# Patient Record
Sex: Male | Born: 1945 | Race: Black or African American | Hispanic: No | Marital: Married | Smoking: Former smoker
Health system: Southern US, Community
[De-identification: ages and names within clinical notes are randomized; demographics above are authoritative.]

## PROBLEM LIST (undated history)

## (undated) DIAGNOSIS — E7800 Pure hypercholesterolemia, unspecified: Secondary | ICD-10-CM

## (undated) DIAGNOSIS — I10 Essential (primary) hypertension: Secondary | ICD-10-CM

## (undated) DIAGNOSIS — E291 Testicular hypofunction: Secondary | ICD-10-CM

## (undated) DIAGNOSIS — K279 Peptic ulcer, site unspecified, unspecified as acute or chronic, without hemorrhage or perforation: Secondary | ICD-10-CM

## (undated) DIAGNOSIS — R7303 Prediabetes: Secondary | ICD-10-CM

## (undated) DIAGNOSIS — N529 Male erectile dysfunction, unspecified: Secondary | ICD-10-CM

## (undated) DIAGNOSIS — C801 Malignant (primary) neoplasm, unspecified: Secondary | ICD-10-CM

## (undated) DIAGNOSIS — K219 Gastro-esophageal reflux disease without esophagitis: Secondary | ICD-10-CM

## (undated) HISTORY — DX: Testicular hypofunction: E29.1

## (undated) HISTORY — DX: Gastro-esophageal reflux disease without esophagitis: K21.9

## (undated) HISTORY — PX: TONSILLECTOMY: SUR1361

## (undated) HISTORY — DX: Prediabetes: R73.03

## (undated) HISTORY — DX: Peptic ulcer, site unspecified, unspecified as acute or chronic, without hemorrhage or perforation: K27.9

## (undated) HISTORY — PX: FINGER AMPUTATION: SHX636

## (undated) HISTORY — DX: Male erectile dysfunction, unspecified: N52.9

---

## 1998-10-12 ENCOUNTER — Emergency Department (HOSPITAL_COMMUNITY): Admission: EM | Admit: 1998-10-12 | Discharge: 1998-10-12 | Payer: Self-pay | Admitting: Emergency Medicine

## 1999-10-02 ENCOUNTER — Other Ambulatory Visit: Admission: RE | Admit: 1999-10-02 | Discharge: 1999-10-02 | Payer: Self-pay | Admitting: Family Medicine

## 2000-08-18 ENCOUNTER — Encounter: Payer: Self-pay | Admitting: Emergency Medicine

## 2000-08-18 ENCOUNTER — Emergency Department (HOSPITAL_COMMUNITY): Admission: EM | Admit: 2000-08-18 | Discharge: 2000-08-18 | Payer: Self-pay | Admitting: Emergency Medicine

## 2002-03-03 ENCOUNTER — Encounter: Admission: RE | Admit: 2002-03-03 | Discharge: 2002-03-03 | Payer: Self-pay | Admitting: Urology

## 2002-03-03 ENCOUNTER — Encounter: Payer: Self-pay | Admitting: Urology

## 2007-01-04 ENCOUNTER — Encounter: Admission: RE | Admit: 2007-01-04 | Discharge: 2007-01-04 | Payer: Self-pay | Admitting: Family Medicine

## 2007-02-01 ENCOUNTER — Encounter: Admission: RE | Admit: 2007-02-01 | Discharge: 2007-02-22 | Payer: Self-pay | Admitting: Family Medicine

## 2008-11-23 ENCOUNTER — Inpatient Hospital Stay (HOSPITAL_BASED_OUTPATIENT_CLINIC_OR_DEPARTMENT_OTHER): Admission: RE | Admit: 2008-11-23 | Discharge: 2008-11-23 | Payer: Self-pay | Admitting: Cardiology

## 2010-10-03 ENCOUNTER — Ambulatory Visit
Admission: RE | Admit: 2010-10-03 | Discharge: 2010-10-03 | Disposition: A | Discharge status: Home / Self Care | Payer: Self-pay | Source: Ambulatory Visit | Attending: Family Medicine | Admitting: Family Medicine

## 2010-10-03 ENCOUNTER — Other Ambulatory Visit: Payer: Self-pay | Admitting: Family Medicine

## 2010-10-03 DIAGNOSIS — R05 Cough: Secondary | ICD-10-CM

## 2010-11-26 NOTE — Cardiovascular Report (Signed)
Drew [name], Drew [name]                ACCOUNT NO.:  [id number]   MEDICAL RECORD NO.:  [id number]          PATIENT TYPE:  OIB   LOCATION:  1961                         FACILITY:  [facility]   PHYSICIAN:  Jake Bathe, MD      DATE OF BIRTH:  01-02-1946   DATE OF PROCEDURE:  11/23/2008  DATE OF DISCHARGE:                            CARDIAC CATHETERIZATION   PROCEDURES:  1. Left heart catheterization  2. Selective coronary angiography.  3. Left ventriculogram.  4. Aortogram.   INDICATIONS:  A 65 year old African American male with hyperlipidemia,  hypertension, recent chest discomfort quite atypical when raising his  hands above his head after heavy work who also had prior dyspnea on  exertion who underwent nuclear stress test on October 30, 2008,  demonstrating a mild-to-moderate ischemic pattern in the anterior wall  distribution as well as possible mid inferior ischemia.  His ejection  fraction was normal at 71%.  He also experienced downsloping 1.5-mm ST  depressions at peak stress and T-wave inversion in III and aVF.   PROCEDURE DETAILS:  Informed consent was obtained.  Risk of stroke,  cardiac attack, death, renal impairment were relayed to the patient at  length.  Arterial damage and venous damage were also explained as  possibility.  He was placed on a catheterization table, prepped in a  sterile fashion.  Lidocaine 1% was used for local anesthesia after  visualizing the femoral head with fluoroscopy.  A No Torque Williams  right catheter was used after unsuccessful attempts with getting the  wire around the arch was used to steer the long exchange wire around the  arch.  This catheter was used to engage the right coronary artery, which  was nondominant, one single shot was obtained.  This catheter was then  exchanged for a Judkins left #4 catheter which was used to selectively  cannulate the left main artery.  Multiple views with hand injection of  Omnipaque were obtained.  This  catheter was then exchanged for an angled  pigtail, which was used to perform left ventriculogram in the RAO  position utilizing power injection.  Hemodynamics were obtained within  the left ventricle and in the aorta, then an aortogram was performed of  the aortic arch in the LAO position to better define his great vessel  anatomy.  30 mL of dye was used for the aortic arch, 25 mL of dye was  used for the left ventriculogram.   FINDINGS:  1. Left main artery - widely patent split into the circumflex, small      ramus branch, and left anterior descending artery.  2. Left anterior descending artery - this vessel has 2 small caliber      diagonal vessels and continues to wrap around the apex to the      distal portion of the inferior wall.  There are only minor      irregularities in this vessel, but no angiographically significant      coronary artery disease.  3. Left circumflex artery - this is the dominant vessel giving rise to  the posterior descending artery.  There are 3 obtuse marginal      branches, the first of which has approximately a 50-70% ostial      stenosis.  The vessel after the stenosis is moderate in size and      supplies the lateral wall - note, there was no ischemia noted in      this lateral wall distribution.  4. Right coronary artery - small nondominant vessel.  5. Left ventriculogram, normal ejection fraction with no wall motion      abnormalities.  Ejection fraction 70%.  No significant mitral      regurgitation present.  6. Aortogram.  The patient has the following anatomy.  He has 2 great      vessels arising from his aortic arch.  The first vessel gives rise      to a large trunk, which then branches into the right subclavian      artery as well as the left internal carotid artery.  He has a      single trunk giving rise to his right subclavian artery and his      right carotid artery, which then branches into his internal and      external carotid.   7. Aortogram.  There are 2 main takeoffs from the aorta.  The first of      which a large trunk, which gives rise to the right subclavian      artery and right common carotid artery.  The second takeoff from      the aortic arch gives rise to the left subclavian and the left      internal carotid artery.  The aortic root is also mildly dilated.      He tolerated the procedure well.  No complications.   IMPRESSION:  1. 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  No evidence of ischemia in this distribution on      nuclear stress test.  2. Normal ejection fraction 70% with no significant mitral stenosis or      aortic stenosis present.  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   PLAN:  We will go ahead and place him on simvastatin 20 mg once a day  for his hyperlipidemia, LDL of 164, and also give him metoprolol ER 25  mg once a day given his stress test findings.  He did exercise for  greater than 8 minutes, which is a good prognostic indicator.      Jake Bathe, MD  Electronically Signed     MCS/MEDQ  D:  11/23/2008  T:  11/23/2008  Job:  [id number]   cc:   Bryan Lemma. Manus Gunning, M.D.

## 2011-04-26 ENCOUNTER — Emergency Department (HOSPITAL_COMMUNITY): Payer: PRIVATE HEALTH INSURANCE

## 2011-04-26 ENCOUNTER — Emergency Department (HOSPITAL_COMMUNITY)
Admission: EM | Admit: 2011-04-26 | Discharge: 2011-04-26 | Disposition: A | Discharge status: Home / Self Care | Payer: PRIVATE HEALTH INSURANCE | Attending: Emergency Medicine | Admitting: Emergency Medicine

## 2011-04-26 DIAGNOSIS — R5381 Other malaise: Secondary | ICD-10-CM | POA: Insufficient documentation

## 2011-04-26 DIAGNOSIS — E7800 Pure hypercholesterolemia, unspecified: Secondary | ICD-10-CM | POA: Insufficient documentation

## 2011-04-26 DIAGNOSIS — R112 Nausea with vomiting, unspecified: Secondary | ICD-10-CM | POA: Insufficient documentation

## 2011-04-26 DIAGNOSIS — R55 Syncope and collapse: Secondary | ICD-10-CM | POA: Insufficient documentation

## 2011-04-26 DIAGNOSIS — R0789 Other chest pain: Secondary | ICD-10-CM | POA: Insufficient documentation

## 2011-04-26 DIAGNOSIS — R61 Generalized hyperhidrosis: Secondary | ICD-10-CM | POA: Insufficient documentation

## 2011-04-26 DIAGNOSIS — Z79899 Other long term (current) drug therapy: Secondary | ICD-10-CM | POA: Insufficient documentation

## 2011-04-26 DIAGNOSIS — R059 Cough, unspecified: Secondary | ICD-10-CM | POA: Insufficient documentation

## 2011-04-26 DIAGNOSIS — I10 Essential (primary) hypertension: Secondary | ICD-10-CM | POA: Insufficient documentation

## 2011-04-26 DIAGNOSIS — R42 Dizziness and giddiness: Secondary | ICD-10-CM | POA: Insufficient documentation

## 2011-04-26 DIAGNOSIS — R05 Cough: Secondary | ICD-10-CM | POA: Insufficient documentation

## 2011-04-26 LAB — DIFFERENTIAL
Basophils Absolute: 0 10*3/uL (ref 0.0–0.1)
Basophils Relative: 1 % (ref 0–1)
Eosinophils Absolute: 0.2 10*3/uL (ref 0.0–0.7)
Eosinophils Relative: 4 % (ref 0–5)
Monocytes Absolute: 0.2 10*3/uL (ref 0.1–1.0)
Monocytes Relative: 6 % (ref 3–12)
Neutro Abs: 2.6 10*3/uL (ref 1.7–7.7)

## 2011-04-26 LAB — COMPREHENSIVE METABOLIC PANEL
ALT: 30 U/L (ref 0–53)
AST: 33 U/L (ref 0–37)
Albumin: 3.6 g/dL (ref 3.5–5.2)
Alkaline Phosphatase: 89 U/L (ref 39–117)
CO2: 27 mEq/L (ref 19–32)
Chloride: 103 mEq/L (ref 96–112)
GFR calc non Af Amer: 85 mL/min — ABNORMAL LOW (ref >90)
Potassium: 3.9 mEq/L (ref 3.5–5.1)
Total Bilirubin: 0.4 mg/dL (ref 0.3–1.2)

## 2011-04-26 LAB — CBC
Hemoglobin: 13.1 g/dL (ref 13.0–17.0)
MCH: 29.4 pg (ref 26.0–34.0)
MCHC: 33.7 g/dL (ref 30.0–36.0)
RDW: 14 % (ref 11.5–15.5)

## 2011-04-26 LAB — POCT I-STAT TROPONIN I: Troponin i, poc: 0 ng/mL (ref 0.00–0.08)

## 2011-09-25 ENCOUNTER — Emergency Department (HOSPITAL_COMMUNITY)
Admission: EM | Admit: 2011-09-25 | Discharge: 2011-09-25 | Disposition: A | Discharge status: Home / Self Care | Payer: PRIVATE HEALTH INSURANCE | Attending: Emergency Medicine | Admitting: Emergency Medicine

## 2011-09-25 ENCOUNTER — Other Ambulatory Visit: Payer: Self-pay

## 2011-09-25 ENCOUNTER — Encounter (HOSPITAL_COMMUNITY): Payer: Self-pay | Admitting: *Deleted

## 2011-09-25 ENCOUNTER — Emergency Department (HOSPITAL_COMMUNITY): Payer: PRIVATE HEALTH INSURANCE

## 2011-09-25 DIAGNOSIS — R55 Syncope and collapse: Secondary | ICD-10-CM | POA: Insufficient documentation

## 2011-09-25 DIAGNOSIS — R110 Nausea: Secondary | ICD-10-CM | POA: Insufficient documentation

## 2011-09-25 DIAGNOSIS — R51 Headache: Secondary | ICD-10-CM | POA: Insufficient documentation

## 2011-09-25 DIAGNOSIS — E785 Hyperlipidemia, unspecified: Secondary | ICD-10-CM | POA: Insufficient documentation

## 2011-09-25 DIAGNOSIS — R61 Generalized hyperhidrosis: Secondary | ICD-10-CM | POA: Insufficient documentation

## 2011-09-25 DIAGNOSIS — I10 Essential (primary) hypertension: Secondary | ICD-10-CM | POA: Insufficient documentation

## 2011-09-25 HISTORY — DX: Essential (primary) hypertension: I10

## 2011-09-25 HISTORY — DX: Pure hypercholesterolemia, unspecified: E78.00

## 2011-09-25 LAB — POCT I-STAT, CHEM 8
BUN: 12 mg/dL (ref 6–23)
Hemoglobin: 13.9 g/dL (ref 13.0–17.0)
Potassium: 3.9 mEq/L (ref 3.5–5.1)
Sodium: 141 mEq/L (ref 135–145)
TCO2: 27 mmol/L (ref 0–100)

## 2011-09-25 LAB — CBC
MCV: 85.8 fL (ref 78.0–100.0)
Platelets: 242 10*3/uL (ref 150–400)
RDW: 13.9 % (ref 11.5–15.5)
WBC: 8 10*3/uL (ref 4.0–10.5)

## 2011-09-25 LAB — URINALYSIS, DIPSTICK ONLY
Glucose, UA: NEGATIVE mg/dL
Leukocytes, UA: NEGATIVE
Protein, ur: NEGATIVE mg/dL
Specific Gravity, Urine: 1.009 (ref 1.005–1.030)
Urobilinogen, UA: 0.2 mg/dL (ref 0.0–1.0)

## 2011-09-25 LAB — POCT I-STAT TROPONIN I: Troponin i, poc: 0.01 ng/mL (ref 0.00–0.08)

## 2011-09-25 NOTE — ED Provider Notes (Signed)
History     CSN: [id number]  Arrival date & time 09/25/11  1640   First MD Initiated Contact with Patient 09/25/11 2118      Chief Complaint  Patient presents with  . Dizziness  . Near Syncope    (Consider location/radiation/quality/duration/timing/severity/associated sxs/prior treatment) HPI Cc short episode of lightheadedness associated with mild nausea and sweating onset about 5 hours ago, was sitting in his car when onset.  sxs were mild, lasted a few seconds.   Past Medical History  Diagnosis Date  . Hypertension   . High cholesterol     History reviewed. No pertinent past surgical history.  History reviewed. No pertinent family history.  History  Substance Use Topics  . Smoking status: Never Smoker   . Smokeless tobacco: Not on file  . Alcohol Use: Yes      Review of Systems  Constitutional: Negative for fever and chills.  HENT: Negative for neck pain and neck stiffness.   Respiratory: Negative for cough and shortness of breath.   Cardiovascular: Negative for chest pain and palpitations.  Gastrointestinal: Positive for nausea. Negative for vomiting, abdominal pain and diarrhea.  Genitourinary: Negative for dysuria.  Neurological: Positive for syncope (near syncope) and light-headedness. Negative for dizziness, seizures, speech difficulty, weakness, numbness and headaches.  All other systems reviewed and are negative.    Allergies  Review of patient's allergies indicates no known allergies.  Home Medications   Current Outpatient Rx  Name Route Sig Dispense Refill  . ASPIRIN EC 81 MG PO TBEC Oral Take 81 mg by mouth daily.    Marland Kitchen PRESCRIPTION MEDICATION Oral Take 1 tablet by mouth daily. Cholesterol and HBP medication    . SODIUM CHLORIDE 0.65 % NA SOLN Nasal Place 1 spray into the nose daily as needed. For congestion      BP 137/82  Pulse 71  Temp 97.8 F (36.6 C)  Resp 21  SpO2 99%RA wnl  Physical Exam  Nursing note and vitals  reviewed. Constitutional: He appears well-developed and well-nourished.  HENT:  Head: Normocephalic and atraumatic.  Eyes: Right eye exhibits no discharge. Left eye exhibits no discharge.  Neck: Normal range of motion. Neck supple.  Cardiovascular: Normal rate, regular rhythm and normal heart sounds.   Pulmonary/Chest: Effort normal and breath sounds normal.  Abdominal: Soft. There is no tenderness.  Musculoskeletal: Normal range of motion. He exhibits no tenderness.  Neurological: He is alert. He has normal strength. No cranial nerve deficit or sensory deficit. Coordination (nl finger to nose, no truncal ataxia, no nystagmus on EOM movements) normal.  Skin: Skin is warm and dry.  Psychiatric: He has a normal mood and affect. His behavior is normal.    ED Course  Procedures (including critical care time)  Labs Reviewed  URINALYSIS, DIPSTICK ONLY - Abnormal; Notable for the following:    Hgb urine dipstick TRACE (*)    All other components within normal limits  POCT I-STAT, CHEM 8 - Abnormal; Notable for the following:    Glucose, Bld 127 (*)    All other components within normal limits  CBC  POCT I-STAT TROPONIN I   Dg Chest 2 View  09/25/2011  *RADIOLOGY REPORT*  Clinical Data: Dizziness, syncope, acid reflux.  CHEST - 2 VIEW  Comparison: 04/26/2011  Findings: Heart size upper normal limits to mildly enlarged. Linear lung base opacities, likely scarring or atelectasis.  No focal consolidation otherwise.  No pleural effusion or pneumothorax.  No acute osseous abnormality.  IMPRESSION: No acute  process identified.  Original Report Authenticated By: Waneta Martins, M.D.     1. Near syncope       MDM  Pt is in nad, afvss, nontoxic appearing, exam and hx as above.  nl neuro exam, no infectious s/sx, no cp, palpitations surrounding event.  ekg shows nonspecific t wave abnl no change from prior.     11:00 PM recheck, pt still asx, [place] syncope score is 0 and pt under  65, results and plan discussed, return warnings given     Elijio Miles, MD 09/25/11 [phone]

## 2011-09-25 NOTE — ED Provider Notes (Signed)
I saw and evaluated the patient, reviewed the resident's note and I agree with the findings and plan and agree with their ECG interpretation. Patient with near syncope. Low risk overall. Workup is reassuring. He'll be discharged home  Juliet Rude. Rubin Payor, MD 09/25/11 [phone]

## 2011-09-25 NOTE — Discharge Instructions (Signed)
Near-Syncope Near-syncope is sudden weakness, dizziness, or feeling like you might pass out (faint). This may occur when getting up after sitting or while standing for a long period of time. Near-syncope can be caused by a drop in blood pressure. This is a common reaction, but it may occur to a greater degree in people taking medicines to control their blood pressure. Fainting often occurs when the blood pressure or pulse is too low to provide enough blood flow to the brain to keep you conscious. Fainting and near-syncope are not usually due to serious medical problems. However, certain people should be more cautious in the event of near-syncope, including elderly patients, patients with diabetes, and patients with a history of heart conditions (especially irregular rhythms).  CAUSES   Drop in blood pressure.   Physical pain.   Dehydration.   Heat exhaustion.   Emotional distress.   Low blood sugar.   Internal bleeding.   Heart and circulatory problems.   Infections.  SYMPTOMS   Dizziness.   Feeling sick to your stomach (nauseous).   Nearly fainting.   Body numbness.   Turning pale.   Tunnel vision.   Weakness.  HOME CARE INSTRUCTIONS   Lie down right away if you start feeling like you might faint. Breathe deeply and steadily. Wait until all the symptoms have passed. Most of these episodes last only a few minutes. You may feel tired for several hours.   Drink enough fluids to keep your urine clear or pale yellow.   If you are taking blood pressure or heart medicine, get up slowly, taking several minutes to sit and then stand. This can reduce dizziness that is caused by a drop in blood pressure.  SEEK IMMEDIATE MEDICAL CARE IF:   You have a severe headache.   Unusual pain develops in the chest, abdomen, or back.   There is bleeding from the mouth or rectum, or you have black or tarry stool.   An irregular heartbeat or a very rapid pulse develops.   You have  repeated fainting or seizure-like jerking during an episode.   You faint when sitting or lying down.   You develop confusion.   You have difficulty walking.   Severe weakness develops.   Vision problems develop.  MAKE SURE YOU:   Understand these instructions.   Will watch your condition.   Will get help right away if you are not doing well or get worse.  Document Released: 06/30/2005 Document Revised: 06/19/2011 Document Reviewed: 08/16/2010 [facility] Patient Information 2012 [place], [place].  Return for any new or worsening symptoms or any other concerns.Near-Syncope Near-syncope is sudden weakness, dizziness, or feeling like you might pass out (faint). This may occur when getting up after sitting or while standing for a long period of time. Near-syncope can be caused by a drop in blood pressure. This is a common reaction, but it may occur to a greater degree in people taking medicines to control their blood pressure. Fainting often occurs when the blood pressure or pulse is too low to provide enough blood flow to the brain to keep you conscious. Fainting and near-syncope are not usually due to serious medical problems. However, certain people should be more cautious in the event of near-syncope, including elderly patients, patients with diabetes, and patients with a history of heart conditions (especially irregular rhythms).  CAUSES   Drop in blood pressure.   Physical pain.   Dehydration.   Heat exhaustion.   Emotional distress.   Low blood  sugar.   Internal bleeding.   Heart and circulatory problems.   Infections.  SYMPTOMS   Dizziness.   Feeling sick to your stomach (nauseous).   Nearly fainting.   Body numbness.   Turning pale.   Tunnel vision.   Weakness.  HOME CARE INSTRUCTIONS   Lie down right away if you start feeling like you might faint. Breathe deeply and steadily. Wait until all the symptoms have passed. Most of these episodes last only a few  minutes. You may feel tired for several hours.   Drink enough fluids to keep your urine clear or pale yellow.   If you are taking blood pressure or heart medicine, get up slowly, taking several minutes to sit and then stand. This can reduce dizziness that is caused by a drop in blood pressure.  SEEK IMMEDIATE MEDICAL CARE IF:   You have a severe headache.   Unusual pain develops in the chest, abdomen, or back.   There is bleeding from the mouth or rectum, or you have black or tarry stool.   An irregular heartbeat or a very rapid pulse develops.   You have repeated fainting or seizure-like jerking during an episode.   You faint when sitting or lying down.   You develop confusion.   You have difficulty walking.   Severe weakness develops.   Vision problems develop.  MAKE SURE YOU:   Understand these instructions.   Will watch your condition.   Will get help right away if you are not doing well or get worse.  Document Released: 06/30/2005 Document Revised: 06/19/2011 Document Reviewed: 08/16/2010 [facility] Patient Information 2012 [place], [place].

## 2011-09-25 NOTE — ED Notes (Signed)
Presents with c/o  Dizziness and  Blurred vision

## 2011-09-25 NOTE — ED Notes (Signed)
Pt remains in waiting room.  No distress noted.  Updated on POC.

## 2011-09-25 NOTE — ED Notes (Signed)
Pt states several months ago he had a near syncopal episode, states he starting to feel that way again today. Reports blurred vision and swimmy head. Stroke scale negative. Denies chest pain but reports diaphoresis.

## 2013-04-25 ENCOUNTER — Encounter (HOSPITAL_COMMUNITY): Payer: Self-pay | Admitting: Emergency Medicine

## 2013-04-25 ENCOUNTER — Emergency Department (HOSPITAL_COMMUNITY)
Admission: EM | Admit: 2013-04-25 | Discharge: 2013-04-25 | Disposition: A | Discharge status: Home / Self Care | Payer: PRIVATE HEALTH INSURANCE | Attending: Emergency Medicine | Admitting: Emergency Medicine

## 2013-04-25 DIAGNOSIS — I10 Essential (primary) hypertension: Secondary | ICD-10-CM | POA: Insufficient documentation

## 2013-04-25 DIAGNOSIS — Z7982 Long term (current) use of aspirin: Secondary | ICD-10-CM | POA: Insufficient documentation

## 2013-04-25 DIAGNOSIS — Y9389 Activity, other specified: Secondary | ICD-10-CM | POA: Insufficient documentation

## 2013-04-25 DIAGNOSIS — E7800 Pure hypercholesterolemia, unspecified: Secondary | ICD-10-CM | POA: Insufficient documentation

## 2013-04-25 DIAGNOSIS — Z79899 Other long term (current) drug therapy: Secondary | ICD-10-CM | POA: Insufficient documentation

## 2013-04-25 DIAGNOSIS — Y92410 Unspecified street and highway as the place of occurrence of the external cause: Secondary | ICD-10-CM | POA: Insufficient documentation

## 2013-04-25 DIAGNOSIS — S3981XA Other specified injuries of abdomen, initial encounter: Secondary | ICD-10-CM | POA: Insufficient documentation

## 2013-04-25 DIAGNOSIS — T148XXA Other injury of unspecified body region, initial encounter: Secondary | ICD-10-CM | POA: Insufficient documentation

## 2013-04-25 MED ORDER — METHOCARBAMOL 500 MG PO TABS: 1000.0000 mg | ORAL_TABLET | Freq: Four times a day (QID) | ORAL | Status: DC

## 2013-04-25 MED ORDER — NAPROXEN 500 MG PO TABS: 500.0000 mg | ORAL_TABLET | Freq: Two times a day (BID) | ORAL | Status: DC
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
## 2013-04-26 NOTE — ED Provider Notes (Signed)
Medical screening examination/treatment/procedure(s) were performed by non-physician practitioner and as supervising physician I was immediately available for consultation/collaboration.   Junius Argyle, MD 04/26/13 [phone]

## 2013-10-13 ENCOUNTER — Encounter: Payer: Self-pay | Admitting: Dietician

## 2013-10-13 ENCOUNTER — Encounter: Payer: PRIVATE HEALTH INSURANCE | Attending: Family Medicine | Admitting: Dietician

## 2013-10-13 VITALS — Ht 67.0 in | Wt 205.1 lb

## 2013-10-13 DIAGNOSIS — R7309 Other abnormal glucose: Secondary | ICD-10-CM | POA: Insufficient documentation

## 2013-10-13 DIAGNOSIS — Z713 Dietary counseling and surveillance: Secondary | ICD-10-CM | POA: Insufficient documentation

## 2013-10-13 DIAGNOSIS — E669 Obesity, unspecified: Secondary | ICD-10-CM

## 2013-10-13 NOTE — Patient Instructions (Addendum)
-  Goal weight: 175 lbs -Healthy weight loss: 1-2 pound loss per week  -Continue to limit mac and cheese, bread, and potatoes -Continue to aim for 10,000 steps a day -Continue to choose lean meats like chicken and fish -Avoid greasy foods and saturated fats -Practice portion control, especially with foods like salad dressings and ice cream

## 2013-10-13 NOTE — Progress Notes (Signed)
  Medical Nutrition Therapy:  Appt start time: 0915 end time:  945.   Assessment:  Primary concerns today: Saladin is here today  Reporting that his doctor says he is borderline diabetic. The patient is here because he wants to control his blood sugars and lower his HgbA1c. 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 He bowls and plays basketball every week. His goal weight is 175 pounds. Since his recent diagnosis of prediabetes, he has made some lifestyle changes: "backed off of potatoes, bread and macaroni and cheese." Antrell reports he gave up sweets for lent in 2014 and lost 15 pounds and has since kept it off.  Preferred Learning Style:  No preference indicated   Learning Readiness:  Ready   MEDICATIONS: see list   DIETARY INTAKE:   Everyday foods include almonds.  Avoided foods include beef, pork, sweets, occasional ice cream. Avoids dairy and shellfish for gout.   24-hr recall:  B ( AM): banana and applesauce with medicine, water and coffee  Snk ( AM):  L ( PM): sandwich Snk ( PM):  D ( PM): salad with chicken  Snk (PM):   Beverages: wine or whiskey occasionally, coffee, water, orange juice  Usual physical activity: bowling and basketball, [employer] (counting steps)  Estimated energy needs: 2200-2400 calories 250 g carbohydrates  Progress Towards Goal(s):  In progress.   Nutritional Diagnosis:  [place]-2.2 Altered nutrition-related laboratory As related to overweight and family history.  As evidenced by diagnosis of prediabetes.    Intervention:  Nutrition counseling provided. -Goal weight: 175 lbs -Healthy weight loss: 1-2 pound loss per week  -Continue to limit mac and cheese, bread, and potatoes -Continue to aim for 10,000 steps a day -Continue to choose lean meats like chicken and fish -Avoid greasy foods and saturated fats -Practice portion control, especially with foods like salad dressings and ice cream  Teaching  Method Utilized: Visual Auditory  Handouts given during visit include:  Nutrition Label  Barriers to learning/adherence to lifestyle change: pulled hamstring currently limiting physical activity  Demonstrated degree of understanding via:  Teach Back   Monitoring/Evaluation:  Dietary intake, exercise, and body weight prn.

## 2014-05-10 ENCOUNTER — Other Ambulatory Visit: Payer: Self-pay | Admitting: Family Medicine

## 2014-05-10 ENCOUNTER — Ambulatory Visit
Admission: RE | Admit: 2014-05-10 | Discharge: 2014-05-10 | Disposition: A | Discharge status: Home / Self Care | Payer: PRIVATE HEALTH INSURANCE | Source: Ambulatory Visit | Attending: Family Medicine | Admitting: Family Medicine

## 2014-05-10 DIAGNOSIS — M545 Low back pain: Secondary | ICD-10-CM

## 2014-07-26 ENCOUNTER — Other Ambulatory Visit: Payer: Self-pay | Admitting: *Deleted

## 2014-07-26 DIAGNOSIS — M792 Neuralgia and neuritis, unspecified: Secondary | ICD-10-CM

## 2014-08-11 ENCOUNTER — Telehealth: Payer: Self-pay | Admitting: Neurology

## 2014-08-11 ENCOUNTER — Encounter: Payer: Self-pay | Admitting: *Deleted

## 2014-08-11 NOTE — Telephone Encounter (Signed)
Pt moved appt from 08-21-14 to 08-14-14 for EMG

## 2014-08-14 ENCOUNTER — Ambulatory Visit (INDEPENDENT_AMBULATORY_CARE_PROVIDER_SITE_OTHER): Payer: PRIVATE HEALTH INSURANCE | Admitting: Neurology

## 2014-08-14 DIAGNOSIS — M792 Neuralgia and neuritis, unspecified: Secondary | ICD-10-CM

## 2014-08-14 DIAGNOSIS — G5622 Lesion of ulnar nerve, left upper limb: Secondary | ICD-10-CM

## 2014-08-14 NOTE — Procedures (Signed)
[facility]  [place], [address]  [place], [place] [address] Tel: [phone] Fax:  [phone] Test Date:  08/14/2014  Patient: Drew [name] DOB: [birth year] Physician: Narda Amber  Sex: Male Height: 5\' 7"  Ref Phys: Gaynelle Arabian  ID#: [id number] Temp: 36.8C Technician:    Patient Complaints: This is a 69 year-old man with history of prediabetes referred for evaluation of left hand numbness which has been ongoing for the past month.  NCV & EMG Findings: Extensive electrodiagnostic testing of the left upper extremity shows:  1. Left median, ulnar, radial, and palmar sensory responses are within normal limits.  2. Left median ulnar motor response shows slowed conduction velocity across the elbow with preserved amplitude and latency. Median motor responses within normal limits. 3. There is no evidence of active or chronic motor axon loss changes affecting any of the tested muscles. Motor unit configuration and recruitment pattern is normal.   Impression: 1. Left ulnar neuropathy with slowing across the elbow, purely demyelinating in type. 2. There is no evidence of carpal tunnel syndrome or a cervical radiculopathy affecting the left upper extremity.   ___________________________ Narda Amber    Nerve Conduction Studies Anti Sensory Summary Table   Stim Site NR Peak (ms) Norm Peak (ms) P-T Amp (V) Norm P-T Amp  Left Median Anti Sensory (2nd Digit)  Wrist    3.0 <3.8 15.4 >10  Left Radial Anti Sensory (Base 1st Digit)  Wrist    2.1 <2.8 17.0 >10  Left Ulnar Anti Sensory (5th Digit)  Wrist    2.8 <3.2 13.5 >5   Motor Summary Table   Stim Site NR Onset (ms) Norm Onset (ms) O-P Amp (mV) Norm O-P Amp Site1 Site2 Delta-0 (ms) Dist (cm) Vel (m/s) Norm Vel (m/s)  Left Median Motor (Abd Poll Brev)  Wrist    2.8 <4.0 11.5 >5 Elbow Wrist 5.2 30.0 58 >50  Elbow    8.0  11.5         Left Ulnar Motor (Abd Dig Minimi)  Wrist    2.5 <3.1 9.8 >7 B Elbow Wrist 3.8  26.0 68 >50  B Elbow    6.3  9.6  A Elbow B Elbow 2.1 10.0 48 >50  A Elbow    8.4  8.9          Comparison Summary Table   Stim Site NR Peak (ms) Norm Peak (ms) P-T Amp (V) Site1 Site2 Delta-P (ms) Norm Delta (ms)  Left Median/Ulnar Palm Comparison (Wrist - 8cm)  Median Palm    1.8 <2.2 28.8 Median Palm Ulnar Palm 0.2   Ulnar Palm    1.6 <2.2 14.0       EMG   Side Muscle Ins Act Fibs Psw Fasc Number Recrt Dur Dur. Amp Amp. Poly Poly. Comment  Left 1stDorInt Nml Nml Nml Nml Nml Nml Nml Nml Nml Nml Nml Nml N/A  Left Ext Indicis Nml Nml Nml Nml Nml Nml Nml Nml Nml Nml Nml Nml N/A  Left PronatorTeres Nml Nml Nml Nml Nml Nml Nml Nml Nml Nml Nml Nml N/A  Left Biceps Nml Nml Nml Nml Nml Nml Nml Nml Nml Nml Nml Nml N/A  Left Triceps Nml Nml Nml Nml Nml Nml Nml Nml Nml Nml Nml Nml N/A  Left Deltoid Nml Nml Nml Nml Nml Nml Nml Nml Nml Nml Nml Nml N/A  Left ABD Dig Min Nml Nml Nml Nml Nml Nml Nml Nml Nml Nml Nml Nml N/A  Left FlexDigProf  4,5 Nml Nml Nml Nml Nml Nml Nml Nml Nml Nml Nml Nml N/A      Waveforms:

## 2014-08-21 ENCOUNTER — Encounter: Payer: PRIVATE HEALTH INSURANCE | Admitting: Neurology

## 2017-04-27 DIAGNOSIS — N50811 Right testicular pain: Secondary | ICD-10-CM | POA: Diagnosis not present

## 2017-04-27 DIAGNOSIS — N528 Other male erectile dysfunction: Secondary | ICD-10-CM | POA: Diagnosis not present

## 2017-04-27 DIAGNOSIS — K4020 Bilateral inguinal hernia, without obstruction or gangrene, not specified as recurrent: Secondary | ICD-10-CM | POA: Diagnosis not present

## 2017-06-02 DIAGNOSIS — N509 Disorder of male genital organs, unspecified: Secondary | ICD-10-CM | POA: Diagnosis not present

## 2017-07-20 DIAGNOSIS — E119 Type 2 diabetes mellitus without complications: Secondary | ICD-10-CM | POA: Diagnosis not present

## 2017-07-20 DIAGNOSIS — I2510 Atherosclerotic heart disease of native coronary artery without angina pectoris: Secondary | ICD-10-CM | POA: Diagnosis not present

## 2017-07-20 DIAGNOSIS — E7800 Pure hypercholesterolemia, unspecified: Secondary | ICD-10-CM | POA: Diagnosis not present

## 2017-07-20 DIAGNOSIS — Z125 Encounter for screening for malignant neoplasm of prostate: Secondary | ICD-10-CM | POA: Diagnosis not present

## 2017-07-20 DIAGNOSIS — N529 Male erectile dysfunction, unspecified: Secondary | ICD-10-CM | POA: Diagnosis not present

## 2017-12-02 ENCOUNTER — Encounter (HOSPITAL_COMMUNITY): Payer: Self-pay | Admitting: Emergency Medicine

## 2017-12-02 ENCOUNTER — Ambulatory Visit (HOSPITAL_COMMUNITY)
Admission: EM | Admit: 2017-12-02 | Discharge: 2017-12-02 | Disposition: A | Discharge status: Home / Self Care | Payer: PRIVATE HEALTH INSURANCE | Attending: Urgent Care | Admitting: Urgent Care

## 2017-12-02 DIAGNOSIS — M542 Cervicalgia: Secondary | ICD-10-CM | POA: Diagnosis not present

## 2017-12-02 DIAGNOSIS — R0789 Other chest pain: Secondary | ICD-10-CM

## 2017-12-02 MED ORDER — TRAMADOL-ACETAMINOPHEN 37.5-325 MG PO TABS: 1.0000 | ORAL_TABLET | Freq: Three times a day (TID) | ORAL | 0 refills | Status: DC | PRN

## 2017-12-02 MED ORDER — CYCLOBENZAPRINE HCL 5 MG PO TABS: 5.0000 mg | ORAL_TABLET | Freq: Every day | ORAL | 0 refills | Status: DC
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
## 2017-12-02 NOTE — Discharge Instructions (Addendum)
You may take 500mg  Tylenol every 6 hours for pain and inflammation. Hydrate well with at least 2 liters (1 gallon) of water daily.
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
## 2018-03-01 DIAGNOSIS — R079 Chest pain, unspecified: Secondary | ICD-10-CM | POA: Diagnosis not present

## 2018-03-01 DIAGNOSIS — R05 Cough: Secondary | ICD-10-CM | POA: Diagnosis not present

## 2018-08-05 DIAGNOSIS — N529 Male erectile dysfunction, unspecified: Secondary | ICD-10-CM | POA: Diagnosis not present

## 2018-08-05 DIAGNOSIS — E7800 Pure hypercholesterolemia, unspecified: Secondary | ICD-10-CM | POA: Diagnosis not present

## 2018-08-05 DIAGNOSIS — E1169 Type 2 diabetes mellitus with other specified complication: Secondary | ICD-10-CM | POA: Diagnosis not present

## 2018-08-05 DIAGNOSIS — I208 Other forms of angina pectoris: Secondary | ICD-10-CM | POA: Diagnosis not present

## 2018-08-05 DIAGNOSIS — R05 Cough: Secondary | ICD-10-CM | POA: Diagnosis not present

## 2018-08-05 DIAGNOSIS — I2510 Atherosclerotic heart disease of native coronary artery without angina pectoris: Secondary | ICD-10-CM | POA: Diagnosis not present

## 2018-08-05 DIAGNOSIS — Z5320 Procedure and treatment not carried out because of patient's decision for unspecified reasons: Secondary | ICD-10-CM | POA: Diagnosis not present

## 2018-08-05 DIAGNOSIS — Z125 Encounter for screening for malignant neoplasm of prostate: Secondary | ICD-10-CM | POA: Diagnosis not present

## 2018-11-18 DIAGNOSIS — M25561 Pain in right knee: Secondary | ICD-10-CM | POA: Diagnosis not present

## 2018-11-18 DIAGNOSIS — M25559 Pain in unspecified hip: Secondary | ICD-10-CM | POA: Diagnosis not present

## 2019-02-23 ENCOUNTER — Other Ambulatory Visit: Payer: Self-pay

## 2019-02-23 DIAGNOSIS — Z20822 Contact with and (suspected) exposure to covid-19: Secondary | ICD-10-CM

## 2019-02-24 LAB — NOVEL CORONAVIRUS, NAA: SARS-CoV-2, NAA: NOT DETECTED

## 2019-02-25 ENCOUNTER — Telehealth: Payer: Self-pay | Admitting: General Practice

## 2019-02-25 NOTE — Telephone Encounter (Signed)
Pt received negative results and stated understanding

## 2019-08-25 ENCOUNTER — Other Ambulatory Visit: Payer: Self-pay

## 2019-08-25 ENCOUNTER — Encounter: Payer: Self-pay | Admitting: Neurology

## 2019-08-25 DIAGNOSIS — R202 Paresthesia of skin: Secondary | ICD-10-CM

## 2019-09-13 ENCOUNTER — Other Ambulatory Visit: Payer: Self-pay

## 2019-09-13 ENCOUNTER — Ambulatory Visit (INDEPENDENT_AMBULATORY_CARE_PROVIDER_SITE_OTHER): Payer: Medicare PPO | Admitting: Neurology

## 2019-09-13 DIAGNOSIS — R202 Paresthesia of skin: Secondary | ICD-10-CM | POA: Diagnosis not present

## 2019-09-13 DIAGNOSIS — G5622 Lesion of ulnar nerve, left upper limb: Secondary | ICD-10-CM

## 2019-09-13 NOTE — Procedures (Signed)
[facility]  [address]  [place], [place] [address] Tel: [phone] Fax:  [phone] Test Date:  09/13/2019  Patient: Drew [name] DOB: 03-12-1946 Physician: Narda Amber, DO  Sex: Male Height: 5\' 7"  Ref Phys: Gaynelle Arabian, MD  ID#: [id number] Temp: 34.0C Technician:    Patient Complaints: This is a 74 year-old man referred for evaluation of left hand numbness/tingling.  NCV & EMG Findings: Extensive electrodiagnostic testing of the left upper extremity shows:  1. Left median, ulnar, and mixed palmar sensory responses are within normal limits. 2. Left median motor responses within normal limits.  Left ulnar motor response shows mildly slowed conduction velocity across the elbow (A Elbow-B Elbow, 48 m/s).   3. There is no evidence of active or chronic motor axonal loss changes affecting any of the tested muscles.  Motor unit configuration and recruitment pattern is within normal limits.  Impression: 1. Left ulnar neuropathy with slowing across the elbow, purely demyelinating, mild. 2. There is no evidence of a cervical radiculopathy or carpal tunnel syndrome affecting the left upper extremity.   ___________________________ Narda Amber, DO    Nerve Conduction Studies Anti Sensory Summary Table   Stim Site NR Peak (ms) Norm Peak (ms) P-T Amp (V) Norm P-T Amp  Left Median Anti Sensory (2nd Digit)  34C  Wrist    3.3 <3.8 20.4 >10  Left Ulnar Anti Sensory (5th Digit)  34C  Wrist    2.9 <3.2 25.1 >5   Motor Summary Table   Stim Site NR Onset (ms) Norm Onset (ms) O-P Amp (mV) Norm O-P Amp Site1 Site2 Delta-0 (ms) Dist (cm) Vel (m/s) Norm Vel (m/s)  Left Median Motor (Abd Poll Brev)  34C  Wrist    3.0 <4.0 7.0 >5 Elbow Wrist 5.4 30.0 56 >50  Elbow    8.4  7.0         Left Ulnar Motor (Abd Dig Minimi)  34C  Wrist    3.0 <3.1 8.7 >7 B Elbow Wrist 3.9 26.0 67 >50  B Elbow    6.9  8.2  A Elbow B Elbow 2.1 10.0 48 >50  A Elbow    9.0  7.7           Comparison Summary Table   Stim Site NR Peak (ms) Norm Peak (ms) P-T Amp (V) Site1 Site2 Delta-P (ms) Norm Delta (ms)  Left Median/Ulnar Palm Comparison (Wrist - 8cm)  34C  Median Palm    2.0 <2.2 28.5 Median Palm Ulnar Palm 0.3   Ulnar Palm    1.7 <2.2 17.0       EMG   Side Muscle Ins Act Fibs Psw Fasc Number Recrt Dur Dur. Amp Amp. Poly Poly. Comment  Left 1stDorInt Nml Nml Nml Nml Nml Nml Nml Nml Nml Nml Nml Nml N/A  Left PronatorTeres Nml Nml Nml Nml Nml Nml Nml Nml Nml Nml Nml Nml N/A  Left Biceps Nml Nml Nml Nml Nml Nml Nml Nml Nml Nml Nml Nml N/A  Left Triceps Nml Nml Nml Nml Nml Nml Nml Nml Nml Nml Nml Nml N/A  Left Deltoid Nml Nml Nml Nml Nml Nml Nml Nml Nml Nml Nml Nml N/A  Left ABD Dig Min Nml Nml Nml Nml Nml Nml Nml Nml Nml Nml Nml Nml N/A  Left FlexCarpiUln Nml Nml Nml Nml Nml Nml Nml Nml Nml Nml Nml Nml N/A      Waveforms:

## 2019-11-19 ENCOUNTER — Emergency Department (HOSPITAL_COMMUNITY)
Admission: EM | Admit: 2019-11-19 | Discharge: 2019-11-19 | Disposition: A | Discharge status: Home / Self Care | Payer: Medicare PPO | Attending: Emergency Medicine | Admitting: Emergency Medicine

## 2019-11-19 ENCOUNTER — Encounter (HOSPITAL_COMMUNITY): Payer: Self-pay | Admitting: Emergency Medicine

## 2019-11-19 ENCOUNTER — Other Ambulatory Visit: Payer: Self-pay

## 2019-11-19 ENCOUNTER — Emergency Department (HOSPITAL_COMMUNITY): Payer: Medicare PPO

## 2019-11-19 DIAGNOSIS — R1031 Right lower quadrant pain: Secondary | ICD-10-CM | POA: Diagnosis not present

## 2019-11-19 DIAGNOSIS — Z79899 Other long term (current) drug therapy: Secondary | ICD-10-CM | POA: Diagnosis not present

## 2019-11-19 DIAGNOSIS — I10 Essential (primary) hypertension: Secondary | ICD-10-CM | POA: Diagnosis not present

## 2019-11-19 DIAGNOSIS — R109 Unspecified abdominal pain: Secondary | ICD-10-CM | POA: Diagnosis not present

## 2019-11-19 DIAGNOSIS — R509 Fever, unspecified: Secondary | ICD-10-CM | POA: Diagnosis not present

## 2019-11-19 DIAGNOSIS — Z7982 Long term (current) use of aspirin: Secondary | ICD-10-CM | POA: Diagnosis not present

## 2019-11-19 DIAGNOSIS — Z87891 Personal history of nicotine dependence: Secondary | ICD-10-CM | POA: Insufficient documentation

## 2019-11-19 DIAGNOSIS — Z20822 Contact with and (suspected) exposure to covid-19: Secondary | ICD-10-CM | POA: Diagnosis not present

## 2019-11-19 LAB — COMPREHENSIVE METABOLIC PANEL
ALT: 38 U/L (ref 0–44)
AST: 39 U/L (ref 15–41)
Albumin: 3.4 g/dL — ABNORMAL LOW (ref 3.5–5.0)
Alkaline Phosphatase: 60 U/L (ref 38–126)
Anion gap: 10 (ref 5–15)
BUN: 9 mg/dL (ref 8–23)
CO2: 26 mmol/L (ref 22–32)
Calcium: 9.1 mg/dL (ref 8.9–10.3)
Chloride: 103 mmol/L (ref 98–111)
Creatinine, Ser: 1.01 mg/dL (ref 0.61–1.24)
GFR calc Af Amer: >60 mL/min (ref >60)
GFR calc non Af Amer: >60 mL/min (ref >60)
Glucose, Bld: 165 mg/dL — ABNORMAL HIGH (ref 70–99)
Potassium: 4.1 mmol/L (ref 3.5–5.1)
Sodium: 139 mmol/L (ref 135–145)
Total Bilirubin: 1 mg/dL (ref 0.3–1.2)
Total Protein: 6.4 g/dL — ABNORMAL LOW (ref 6.5–8.1)

## 2019-11-19 LAB — CBC
HCT: 42.9 % (ref 39.0–52.0)
Hemoglobin: 13.8 g/dL (ref 13.0–17.0)
MCH: 31 pg (ref 26.0–34.0)
MCHC: 32.2 g/dL (ref 30.0–36.0)
MCV: 96.4 fL (ref 80.0–100.0)
Platelets: 182 10*3/uL (ref 150–400)
RBC: 4.45 MIL/uL (ref 4.22–5.81)
RDW: 12.9 % (ref 11.5–15.5)
WBC: 5.7 10*3/uL (ref 4.0–10.5)
nRBC: 0 % (ref 0.0–0.2)

## 2019-11-19 LAB — RESPIRATORY PANEL BY RT PCR (FLU A&B, COVID)
Influenza A by PCR: NEGATIVE
Influenza B by PCR: NEGATIVE
SARS Coronavirus 2 by RT PCR: NEGATIVE

## 2019-11-19 LAB — URINALYSIS, ROUTINE W REFLEX MICROSCOPIC
Bilirubin Urine: NEGATIVE
Glucose, UA: NEGATIVE mg/dL
Hgb urine dipstick: NEGATIVE
Ketones, ur: NEGATIVE mg/dL
Leukocytes,Ua: NEGATIVE
Nitrite: NEGATIVE
Protein, ur: NEGATIVE mg/dL
Specific Gravity, Urine: 1.02 (ref 1.005–1.030)
pH: 6 (ref 5.0–8.0)

## 2019-11-19 LAB — LIPASE, BLOOD: Lipase: 31 U/L (ref 11–51)

## 2019-11-19 MED ORDER — IOHEXOL 300 MG/ML  SOLN
100.0000 mL | Freq: Once | INTRAMUSCULAR | Status: AC | PRN
  Administered 2019-11-19: 100 mL via INTRAVENOUS

## 2019-11-19 MED ORDER — SODIUM CHLORIDE 0.9% FLUSH
3.0000 mL | Freq: Once | INTRAVENOUS | Status: AC
  Administered 2019-11-19: 3 mL via INTRAVENOUS

## 2019-11-19 NOTE — ED Provider Notes (Signed)
[facility] EMERGENCY DEPARTMENT Provider Note   CSN: [id number] Arrival date & time: 11/19/19  [id number]     History Chief Complaint  Patient presents with  . Abdominal Pain    Drew [name] is a 74 y.o. male.  Presents to the emergency department with chief complaint of right lower abdominal pain.  Reports yesterday he started noting intermittent sharp right-sided abdominal pain, primarily in his right lower abdomen.  Noted fever up to 102 yesterday.  No associated cough or difficulty breathing.  He has not had any nausea or vomiting or diarrhea.  Pain seemed to subside throughout the morning today.  No known Covid contacts. No dysuria, penile pain, scrotal pain or swelling.   Past medical history GERD, hypertension, hyperlipidemia.  Denies any past surgical abdominal history.  HPI     Past Medical History:  Diagnosis Date  . ED (erectile dysfunction)   . GERD (gastroesophageal reflux disease)   . High cholesterol   . Hypertension   . Hypogonadism male   . Prediabetes   . PUD (peptic ulcer disease)     There are no problems to display for this patient.   Past Surgical History:  Procedure Laterality Date  . FINGER AMPUTATION    . TONSILLECTOMY         Family History  Problem Relation Age of Onset  . Diabetes Other   . Prostate cancer Brother        both brothers    Social History   Tobacco Use  . Smoking status: Former Smoker  Substance Use Topics  . Alcohol use: Yes    Alcohol/week: 10.0 standard drinks    Types: 10 Glasses of wine per week  . Drug use: No    Home Medications Prior to Admission medications   Medication Sig Start Date End Date Taking? Authorizing Provider  acetaminophen (TYLENOL) 500 MG tablet Take 1,000 mg by mouth every 6 (six) hours as needed for mild pain.   Yes [provider]  aspirin EC 81 MG tablet Take 81 mg by mouth daily.   Yes [provider]  atorvastatin (LIPITOR) 10 MG tablet Take 10  mg by mouth daily.   Yes [provider]  metoprolol (LOPRESSOR) 50 MG tablet Take 50 mg by mouth daily.   Yes [provider]  cyclobenzaprine (FLEXERIL) 5 MG tablet Take 1 tablet (5 mg total) by mouth at bedtime. Patient not taking: Reported on 11/19/2019 12/02/17   Jaynee Eagles, PA-C  traMADol-acetaminophen (ULTRACET) 37.5-325 MG tablet Take 1 tablet by mouth every 8 (eight) hours as needed for severe pain. Patient not taking: Reported on 11/19/2019 12/02/17   Jaynee Eagles, PA-C    Allergies    Patient has no known allergies.  Review of Systems   Review of Systems  Constitutional: Positive for fever. Negative for chills.  HENT: Negative for ear pain and sore throat.   Eyes: Negative for pain and visual disturbance.  Respiratory: Negative for cough and shortness of breath.   Cardiovascular: Negative for chest pain and palpitations.  Gastrointestinal: Positive for abdominal pain. Negative for vomiting.  Genitourinary: Negative for dysuria and hematuria.  Musculoskeletal: Negative for arthralgias and back pain.  Skin: Negative for color change and rash.  Neurological: Negative for seizures and syncope.  All other systems reviewed and are negative.   Physical Exam Updated Vital Signs BP (!) 144/81 (BP Location: Right Arm)   Pulse 67   Temp 97.8 F (36.6 C) (Oral)  Resp 15   Ht 5\' 7"  (1.702 m)   Wt 90.7 kg   SpO2 100%   BMI 31.32 kg/m   Physical Exam Vitals and nursing note reviewed.  Constitutional:      Appearance: He is well-developed.  HENT:     Head: Normocephalic and atraumatic.  Eyes:     Conjunctiva/sclera: Conjunctivae normal.  Cardiovascular:     Rate and Rhythm: Normal rate and regular rhythm.     Heart sounds: No murmur.  Pulmonary:     Effort: Pulmonary effort is normal. No respiratory distress.     Breath sounds: Normal breath sounds.  Abdominal:     Palpations: Abdomen is soft.     Comments: Very mild tenderness noted in RLQ but very  soft, no rebound or guarding  Musculoskeletal:     Cervical back: Neck supple.  Skin:    General: Skin is warm and dry.  Neurological:     General: No focal deficit present.     Mental Status: He is alert.     ED Results / Procedures / Treatments   Labs (all labs ordered are listed, but only abnormal results are displayed) Labs Reviewed  COMPREHENSIVE METABOLIC PANEL - Abnormal; Notable for the following components:      Result Value   Glucose, Bld 165 (*)    Total Protein 6.4 (*)    Albumin 3.4 (*)    All other components within normal limits  RESPIRATORY PANEL BY RT PCR (FLU A&B, COVID)  LIPASE, BLOOD  CBC  URINALYSIS, ROUTINE W REFLEX MICROSCOPIC    EKG None  Radiology CT ABDOMEN PELVIS W CONTRAST  Result Date: 11/19/2019 CLINICAL DATA:  Right lower quadrant abdominal pain, fever EXAM: CT ABDOMEN AND PELVIS WITH CONTRAST TECHNIQUE: Multidetector CT imaging of the abdomen and pelvis was performed using the standard protocol following bolus administration of intravenous contrast. CONTRAST:  165mL OMNIPAQUE IOHEXOL 300 MG/ML  SOLN COMPARISON:  03/03/2002 by report only FINDINGS: Lower chest: Minimal dependent atelectasis posteriorly at the lung bases. No pleural or pericardial effusion. Hepatobiliary: Gallbladder decompressed. No biliary ductal dilatation.1.9 cm probable cyst in hepatic segment 2. 1.7 cm possible cyst in hepatic segment 6. Pancreas: Unremarkable. No pancreatic ductal dilatation or surrounding inflammatory changes. Spleen: Normal in size without focal abnormality. Adrenals/Urinary Tract: Normal adrenals. Small bilateral low-attenuation renal lesions up to 1.5 cm probably cysts but incompletely characterized. No hydronephrosis. No definite solid renal mass. Urinary bladder incompletely distended. Stomach/Bowel: Stomach is incompletely distended. Small bowel is decompressed. Normal appendix. The colon is nondilated, unremarkable. Vascular/Lymphatic: Mild calcified  aortoiliac plaque without aneurysm or stenosis. No abdominal or pelvic adenopathy. Portal vein patent. Reproductive: Prostate is unremarkable. Other: Left pelvic phlebolith. No ascites. No free air. Musculoskeletal: Facet DJD in the lower lumbar spine. Mild bilateral hip DJD. No fracture or worrisome bone lesion. IMPRESSION: 1. No acute findings. Normal appendix. 2. Probable hepatic and renal cysts. Aortic Atherosclerosis (ICD10-I70.0). Electronically Signed   By: Lucrezia Europe M.D.   On: 11/19/2019 13:01    Procedures Procedures (including critical care time)  Medications Ordered in ED Medications  sodium chloride flush (NS) 0.9 % injection 3 mL (3 mLs Intravenous Given 11/19/19 1222)  iohexol (OMNIPAQUE) 300 MG/ML solution 100 mL (100 mLs Intravenous Contrast Given 11/19/19 1241)    ED Course  I have reviewed the triage vital signs and the nursing notes.  Pertinent labs & imaging results that were available during my care of the patient were reviewed by me and considered  in my medical decision making (see chart for details).    MDM Rules/Calculators/A&P                      74 year old male came to ER with right lower quadrant abdominal pain.  Also reported having a fever yesterday.  Today, patient is remarkably well-appearing.  Benign abdominal exam.  Given age, reported fever yesterday, proceeded with labs and CT imaging.  Labs were grossly within normal limits, noted normal LFTs and normal lipase.  UA negative for infection.  CT negative for acute abdominal process.  Given the reported fever yesterday, check COVID-19 swab.  Given patient's work-up today no ongoing symptoms, believe patient can be discharged home and recommended follow-up with primary doctor.    After the discussed management above, the patient was determined to be safe for discharge.  The patient was in agreement with this plan and all questions regarding their care were answered.  ED return precautions were discussed and the  patient will return to the ED with any significant worsening of condition.    Final Clinical Impression(s) / ED Diagnoses Final diagnoses:  Right lower quadrant abdominal pain    Rx / DC Orders ED Discharge Orders    None       Lucrezia Starch, MD 11/20/19 1649

## 2019-11-19 NOTE — Discharge Instructions (Signed)
Recommend scheduling a follow-up appointment with your primary doctor for recheck next week.  If you feel that your pain is worsening, you develop vomiting, fever or other new concerning symptom, return to ER for reassessment.

## 2019-11-19 NOTE — ED Triage Notes (Addendum)
C/o sharp R sided abd pain since yesterday.  Reports fever yesterday (102).  Denies nausea, vomiting, and diarrhea.  Last Tylenol yesterday.

## 2019-11-19 NOTE — ED Notes (Signed)
Patient verbalizes understanding of discharge instructions. Opportunity for questioning and answers were provided. Armband removed by staff, pt discharged from ED.  

## 2019-11-23 DIAGNOSIS — R1031 Right lower quadrant pain: Secondary | ICD-10-CM | POA: Diagnosis not present

## 2019-12-14 DIAGNOSIS — R109 Unspecified abdominal pain: Secondary | ICD-10-CM | POA: Diagnosis not present

## 2019-12-22 DIAGNOSIS — M722 Plantar fascial fibromatosis: Secondary | ICD-10-CM | POA: Diagnosis not present

## 2019-12-22 DIAGNOSIS — R351 Nocturia: Secondary | ICD-10-CM | POA: Diagnosis not present

## 2019-12-22 DIAGNOSIS — R6881 Early satiety: Secondary | ICD-10-CM | POA: Diagnosis not present

## 2020-01-03 ENCOUNTER — Other Ambulatory Visit: Payer: Self-pay

## 2020-01-03 ENCOUNTER — Emergency Department (HOSPITAL_COMMUNITY)
Admission: EM | Admit: 2020-01-03 | Discharge: 2020-01-03 | Discharge status: Against Medical Advice | Payer: Medicare PPO

## 2020-01-03 DIAGNOSIS — C787 Secondary malignant neoplasm of liver and intrahepatic bile duct: Secondary | ICD-10-CM | POA: Diagnosis not present

## 2020-01-03 DIAGNOSIS — R1084 Generalized abdominal pain: Secondary | ICD-10-CM | POA: Diagnosis not present

## 2020-01-03 DIAGNOSIS — D62 Acute posthemorrhagic anemia: Secondary | ICD-10-CM | POA: Diagnosis present

## 2020-01-03 DIAGNOSIS — J90 Pleural effusion, not elsewhere classified: Secondary | ICD-10-CM | POA: Diagnosis present

## 2020-01-03 DIAGNOSIS — R0602 Shortness of breath: Secondary | ICD-10-CM | POA: Diagnosis not present

## 2020-01-03 DIAGNOSIS — I6781 Acute cerebrovascular insufficiency: Secondary | ICD-10-CM | POA: Diagnosis not present

## 2020-01-03 DIAGNOSIS — Z452 Encounter for adjustment and management of vascular access device: Secondary | ICD-10-CM | POA: Diagnosis not present

## 2020-01-03 DIAGNOSIS — K311 Adult hypertrophic pyloric stenosis: Secondary | ICD-10-CM | POA: Diagnosis not present

## 2020-01-03 DIAGNOSIS — K922 Gastrointestinal hemorrhage, unspecified: Secondary | ICD-10-CM | POA: Diagnosis not present

## 2020-01-03 DIAGNOSIS — K56609 Unspecified intestinal obstruction, unspecified as to partial versus complete obstruction: Secondary | ICD-10-CM | POA: Diagnosis not present

## 2020-01-03 DIAGNOSIS — C799 Secondary malignant neoplasm of unspecified site: Secondary | ICD-10-CM | POA: Diagnosis present

## 2020-01-03 DIAGNOSIS — K769 Liver disease, unspecified: Secondary | ICD-10-CM | POA: Diagnosis not present

## 2020-01-03 DIAGNOSIS — N2889 Other specified disorders of kidney and ureter: Secondary | ICD-10-CM | POA: Diagnosis not present

## 2020-01-03 DIAGNOSIS — R634 Abnormal weight loss: Secondary | ICD-10-CM | POA: Diagnosis not present

## 2020-01-03 DIAGNOSIS — C169 Malignant neoplasm of stomach, unspecified: Secondary | ICD-10-CM | POA: Diagnosis not present

## 2020-01-03 DIAGNOSIS — N3289 Other specified disorders of bladder: Secondary | ICD-10-CM | POA: Diagnosis not present

## 2020-01-03 DIAGNOSIS — E871 Hypo-osmolality and hyponatremia: Secondary | ICD-10-CM | POA: Diagnosis not present

## 2020-01-03 DIAGNOSIS — I2693 Single subsegmental pulmonary embolism without acute cor pulmonale: Secondary | ICD-10-CM | POA: Diagnosis not present

## 2020-01-03 DIAGNOSIS — I639 Cerebral infarction, unspecified: Secondary | ICD-10-CM | POA: Diagnosis not present

## 2020-01-03 DIAGNOSIS — R112 Nausea with vomiting, unspecified: Secondary | ICD-10-CM | POA: Diagnosis not present

## 2020-01-03 DIAGNOSIS — I080 Rheumatic disorders of both mitral and aortic valves: Secondary | ICD-10-CM | POA: Diagnosis not present

## 2020-01-03 DIAGNOSIS — E43 Unspecified severe protein-calorie malnutrition: Secondary | ICD-10-CM | POA: Diagnosis not present

## 2020-01-03 DIAGNOSIS — I10 Essential (primary) hypertension: Secondary | ICD-10-CM | POA: Diagnosis not present

## 2020-01-03 DIAGNOSIS — K259 Gastric ulcer, unspecified as acute or chronic, without hemorrhage or perforation: Secondary | ICD-10-CM | POA: Diagnosis not present

## 2020-01-03 DIAGNOSIS — I82611 Acute embolism and thrombosis of superficial veins of right upper extremity: Secondary | ICD-10-CM | POA: Diagnosis not present

## 2020-01-03 DIAGNOSIS — R339 Retention of urine, unspecified: Secondary | ICD-10-CM | POA: Diagnosis not present

## 2020-01-03 DIAGNOSIS — Z6830 Body mass index (BMI) 30.0-30.9, adult: Secondary | ICD-10-CM | POA: Diagnosis not present

## 2020-01-03 DIAGNOSIS — I2699 Other pulmonary embolism without acute cor pulmonale: Secondary | ICD-10-CM | POA: Diagnosis not present

## 2020-01-03 DIAGNOSIS — D500 Iron deficiency anemia secondary to blood loss (chronic): Secondary | ICD-10-CM | POA: Diagnosis not present

## 2020-01-03 DIAGNOSIS — C801 Malignant (primary) neoplasm, unspecified: Secondary | ICD-10-CM | POA: Diagnosis not present

## 2020-01-03 DIAGNOSIS — K319 Disease of stomach and duodenum, unspecified: Secondary | ICD-10-CM | POA: Diagnosis not present

## 2020-01-03 DIAGNOSIS — E669 Obesity, unspecified: Secondary | ICD-10-CM | POA: Diagnosis not present

## 2020-01-03 DIAGNOSIS — C786 Secondary malignant neoplasm of retroperitoneum and peritoneum: Secondary | ICD-10-CM | POA: Diagnosis not present

## 2020-01-03 DIAGNOSIS — K219 Gastro-esophageal reflux disease without esophagitis: Secondary | ICD-10-CM | POA: Diagnosis not present

## 2020-01-03 DIAGNOSIS — K5730 Diverticulosis of large intestine without perforation or abscess without bleeding: Secondary | ICD-10-CM | POA: Diagnosis not present

## 2020-01-03 DIAGNOSIS — K3189 Other diseases of stomach and duodenum: Secondary | ICD-10-CM | POA: Diagnosis not present

## 2020-01-03 DIAGNOSIS — C7989 Secondary malignant neoplasm of other specified sites: Secondary | ICD-10-CM | POA: Diagnosis not present

## 2020-01-03 NOTE — ED Notes (Signed)
Called pt x3 for triage, no response. 

## 2020-01-06 DIAGNOSIS — E611 Iron deficiency: Secondary | ICD-10-CM | POA: Insufficient documentation

## 2020-01-17 DIAGNOSIS — C169 Malignant neoplasm of stomach, unspecified: Secondary | ICD-10-CM | POA: Diagnosis present

## 2020-01-20 DIAGNOSIS — K311 Adult hypertrophic pyloric stenosis: Secondary | ICD-10-CM | POA: Diagnosis present

## 2020-01-20 DIAGNOSIS — I639 Cerebral infarction, unspecified: Secondary | ICD-10-CM | POA: Diagnosis present

## 2020-01-20 DIAGNOSIS — I2699 Other pulmonary embolism without acute cor pulmonale: Secondary | ICD-10-CM | POA: Diagnosis present

## 2020-01-20 DIAGNOSIS — E43 Unspecified severe protein-calorie malnutrition: Secondary | ICD-10-CM | POA: Diagnosis present

## 2020-01-22 ENCOUNTER — Other Ambulatory Visit: Payer: Self-pay

## 2020-01-22 ENCOUNTER — Encounter (HOSPITAL_COMMUNITY): Payer: Self-pay

## 2020-01-22 ENCOUNTER — Inpatient Hospital Stay (HOSPITAL_COMMUNITY)
Admission: EM | Admit: 2020-01-22 | Discharge: 2020-01-29 | DRG: 871 | Disposition: A | Discharge status: Home Health | Payer: Medicare PPO | Attending: Internal Medicine | Admitting: Internal Medicine

## 2020-01-22 ENCOUNTER — Emergency Department (HOSPITAL_COMMUNITY): Payer: Medicare PPO

## 2020-01-22 DIAGNOSIS — Z87891 Personal history of nicotine dependence: Secondary | ICD-10-CM

## 2020-01-22 DIAGNOSIS — Z7901 Long term (current) use of anticoagulants: Secondary | ICD-10-CM

## 2020-01-22 DIAGNOSIS — R180 Malignant ascites: Secondary | ICD-10-CM | POA: Diagnosis present

## 2020-01-22 DIAGNOSIS — E43 Unspecified severe protein-calorie malnutrition: Secondary | ICD-10-CM | POA: Diagnosis not present

## 2020-01-22 DIAGNOSIS — R0902 Hypoxemia: Secondary | ICD-10-CM | POA: Diagnosis not present

## 2020-01-22 DIAGNOSIS — I700 Atherosclerosis of aorta: Secondary | ICD-10-CM | POA: Diagnosis not present

## 2020-01-22 DIAGNOSIS — D62 Acute posthemorrhagic anemia: Secondary | ICD-10-CM | POA: Diagnosis present

## 2020-01-22 DIAGNOSIS — K567 Ileus, unspecified: Secondary | ICD-10-CM

## 2020-01-22 DIAGNOSIS — Z7189 Other specified counseling: Secondary | ICD-10-CM | POA: Diagnosis not present

## 2020-01-22 DIAGNOSIS — K922 Gastrointestinal hemorrhage, unspecified: Secondary | ICD-10-CM | POA: Diagnosis present

## 2020-01-22 DIAGNOSIS — Z4659 Encounter for fitting and adjustment of other gastrointestinal appliance and device: Secondary | ICD-10-CM

## 2020-01-22 DIAGNOSIS — C787 Secondary malignant neoplasm of liver and intrahepatic bile duct: Secondary | ICD-10-CM | POA: Diagnosis present

## 2020-01-22 DIAGNOSIS — E785 Hyperlipidemia, unspecified: Secondary | ICD-10-CM | POA: Diagnosis present

## 2020-01-22 DIAGNOSIS — Z4682 Encounter for fitting and adjustment of non-vascular catheter: Secondary | ICD-10-CM | POA: Diagnosis not present

## 2020-01-22 DIAGNOSIS — C169 Malignant neoplasm of stomach, unspecified: Secondary | ICD-10-CM | POA: Diagnosis present

## 2020-01-22 DIAGNOSIS — K219 Gastro-esophageal reflux disease without esophagitis: Secondary | ICD-10-CM | POA: Diagnosis present

## 2020-01-22 DIAGNOSIS — Z20822 Contact with and (suspected) exposure to covid-19: Secondary | ICD-10-CM | POA: Diagnosis not present

## 2020-01-22 DIAGNOSIS — R6521 Severe sepsis with septic shock: Secondary | ICD-10-CM | POA: Diagnosis not present

## 2020-01-22 DIAGNOSIS — K311 Adult hypertrophic pyloric stenosis: Secondary | ICD-10-CM | POA: Diagnosis present

## 2020-01-22 DIAGNOSIS — R231 Pallor: Secondary | ICD-10-CM | POA: Diagnosis not present

## 2020-01-22 DIAGNOSIS — Z86711 Personal history of pulmonary embolism: Secondary | ICD-10-CM | POA: Diagnosis not present

## 2020-01-22 DIAGNOSIS — Z8042 Family history of malignant neoplasm of prostate: Secondary | ICD-10-CM

## 2020-01-22 DIAGNOSIS — Z7982 Long term (current) use of aspirin: Secondary | ICD-10-CM | POA: Diagnosis not present

## 2020-01-22 DIAGNOSIS — E876 Hypokalemia: Secondary | ICD-10-CM | POA: Diagnosis not present

## 2020-01-22 DIAGNOSIS — Z66 Do not resuscitate: Secondary | ICD-10-CM | POA: Diagnosis not present

## 2020-01-22 DIAGNOSIS — Z8673 Personal history of transient ischemic attack (TIA), and cerebral infarction without residual deficits: Secondary | ICD-10-CM | POA: Diagnosis not present

## 2020-01-22 DIAGNOSIS — R000 Tachycardia, unspecified: Secondary | ICD-10-CM | POA: Diagnosis not present

## 2020-01-22 DIAGNOSIS — K3189 Other diseases of stomach and duodenum: Secondary | ICD-10-CM | POA: Diagnosis not present

## 2020-01-22 DIAGNOSIS — C786 Secondary malignant neoplasm of retroperitoneum and peritoneum: Secondary | ICD-10-CM | POA: Diagnosis present

## 2020-01-22 DIAGNOSIS — C799 Secondary malignant neoplasm of unspecified site: Secondary | ICD-10-CM | POA: Diagnosis present

## 2020-01-22 DIAGNOSIS — I2699 Other pulmonary embolism without acute cor pulmonale: Secondary | ICD-10-CM | POA: Diagnosis present

## 2020-01-22 DIAGNOSIS — Z515 Encounter for palliative care: Secondary | ICD-10-CM

## 2020-01-22 DIAGNOSIS — R1111 Vomiting without nausea: Secondary | ICD-10-CM | POA: Diagnosis not present

## 2020-01-22 DIAGNOSIS — R188 Other ascites: Secondary | ICD-10-CM | POA: Diagnosis not present

## 2020-01-22 DIAGNOSIS — A419 Sepsis, unspecified organism: Principal | ICD-10-CM | POA: Diagnosis present

## 2020-01-22 DIAGNOSIS — E7800 Pure hypercholesterolemia, unspecified: Secondary | ICD-10-CM | POA: Diagnosis present

## 2020-01-22 DIAGNOSIS — I639 Cerebral infarction, unspecified: Secondary | ICD-10-CM | POA: Diagnosis present

## 2020-01-22 DIAGNOSIS — C801 Malignant (primary) neoplasm, unspecified: Secondary | ICD-10-CM | POA: Diagnosis not present

## 2020-01-22 DIAGNOSIS — R109 Unspecified abdominal pain: Secondary | ICD-10-CM | POA: Diagnosis not present

## 2020-01-22 DIAGNOSIS — R935 Abnormal findings on diagnostic imaging of other abdominal regions, including retroperitoneum: Secondary | ICD-10-CM | POA: Diagnosis not present

## 2020-01-22 DIAGNOSIS — R140 Abdominal distension (gaseous): Secondary | ICD-10-CM | POA: Diagnosis not present

## 2020-01-22 DIAGNOSIS — I10 Essential (primary) hypertension: Secondary | ICD-10-CM | POA: Diagnosis present

## 2020-01-22 DIAGNOSIS — N281 Cyst of kidney, acquired: Secondary | ICD-10-CM | POA: Diagnosis not present

## 2020-01-22 DIAGNOSIS — R6520 Severe sepsis without septic shock: Secondary | ICD-10-CM | POA: Diagnosis not present

## 2020-01-22 DIAGNOSIS — Z6829 Body mass index (BMI) 29.0-29.9, adult: Secondary | ICD-10-CM

## 2020-01-22 DIAGNOSIS — J90 Pleural effusion, not elsewhere classified: Secondary | ICD-10-CM | POA: Diagnosis present

## 2020-01-22 DIAGNOSIS — R110 Nausea: Secondary | ICD-10-CM | POA: Diagnosis not present

## 2020-01-22 DIAGNOSIS — R1110 Vomiting, unspecified: Secondary | ICD-10-CM | POA: Diagnosis not present

## 2020-01-22 DIAGNOSIS — J189 Pneumonia, unspecified organism: Secondary | ICD-10-CM

## 2020-01-22 LAB — CBC WITH DIFFERENTIAL/PLATELET
Abs Immature Granulocytes: 0.08 10*3/uL — ABNORMAL HIGH (ref 0.00–0.07)
Basophils Absolute: 0 10*3/uL (ref 0.0–0.1)
Basophils Relative: 0 %
Eosinophils Absolute: 0 10*3/uL (ref 0.0–0.5)
Eosinophils Relative: 0 %
HCT: 32.3 % — ABNORMAL LOW (ref 39.0–52.0)
Hemoglobin: 10 g/dL — ABNORMAL LOW (ref 13.0–17.0)
Immature Granulocytes: 1 %
Lymphocytes Relative: 6 %
Lymphs Abs: 0.4 10*3/uL — ABNORMAL LOW (ref 0.7–4.0)
MCH: 28.4 pg (ref 26.0–34.0)
MCHC: 31 g/dL (ref 30.0–36.0)
MCV: 91.8 fL (ref 80.0–100.0)
Monocytes Absolute: 0.1 10*3/uL (ref 0.1–1.0)
Monocytes Relative: 1 %
Neutro Abs: 6.7 10*3/uL (ref 1.7–7.7)
Neutrophils Relative %: 92 %
Platelets: 400 10*3/uL (ref 150–400)
RBC: 3.52 MIL/uL — ABNORMAL LOW (ref 4.22–5.81)
RDW: 16.6 % — ABNORMAL HIGH (ref 11.5–15.5)
WBC: 7.4 10*3/uL (ref 4.0–10.5)
nRBC: 0 % (ref 0.0–0.2)

## 2020-01-22 LAB — PROTIME-INR
INR: 1.4 — ABNORMAL HIGH (ref 0.8–1.2)
Prothrombin Time: 16.1 seconds — ABNORMAL HIGH (ref 11.4–15.2)

## 2020-01-22 LAB — COMPREHENSIVE METABOLIC PANEL
ALT: 26 U/L (ref 0–44)
AST: 58 U/L — ABNORMAL HIGH (ref 15–41)
Albumin: 2.8 g/dL — ABNORMAL LOW (ref 3.5–5.0)
Alkaline Phosphatase: 110 U/L (ref 38–126)
Anion gap: 14 (ref 5–15)
BUN: 9 mg/dL (ref 8–23)
CO2: 20 mmol/L — ABNORMAL LOW (ref 22–32)
Calcium: 8.8 mg/dL — ABNORMAL LOW (ref 8.9–10.3)
Chloride: 102 mmol/L (ref 98–111)
Creatinine, Ser: 1.05 mg/dL (ref 0.61–1.24)
GFR calc Af Amer: >60 mL/min (ref >60)
GFR calc non Af Amer: >60 mL/min (ref >60)
Glucose, Bld: 162 mg/dL — ABNORMAL HIGH (ref 70–99)
Potassium: 3.8 mmol/L (ref 3.5–5.1)
Sodium: 136 mmol/L (ref 135–145)
Total Bilirubin: 0.6 mg/dL (ref 0.3–1.2)
Total Protein: 6.8 g/dL (ref 6.5–8.1)

## 2020-01-22 LAB — TSH: TSH: 0.76 u[IU]/mL (ref 0.350–4.500)

## 2020-01-22 LAB — URINALYSIS, ROUTINE W REFLEX MICROSCOPIC
Bilirubin Urine: NEGATIVE
Glucose, UA: NEGATIVE mg/dL
Hgb urine dipstick: NEGATIVE
Ketones, ur: NEGATIVE mg/dL
Leukocytes,Ua: NEGATIVE
Nitrite: NEGATIVE
Protein, ur: NEGATIVE mg/dL
Specific Gravity, Urine: >1.046 — ABNORMAL HIGH (ref 1.005–1.030)
pH: 6 (ref 5.0–8.0)

## 2020-01-22 LAB — APTT: aPTT: 28 seconds (ref 24–36)

## 2020-01-22 LAB — LACTIC ACID, PLASMA
Lactic Acid, Venous: 2.7 mmol/L (ref 0.5–1.9)
Lactic Acid, Venous: 5.3 mmol/L (ref 0.5–1.9)

## 2020-01-22 LAB — MRSA PCR SCREENING: MRSA by PCR: NEGATIVE

## 2020-01-22 LAB — TROPONIN I (HIGH SENSITIVITY): Troponin I (High Sensitivity): 14 ng/L (ref <18)

## 2020-01-22 LAB — SARS CORONAVIRUS 2 BY RT PCR (HOSPITAL ORDER, PERFORMED IN ~~LOC~~ HOSPITAL LAB): SARS Coronavirus 2: NEGATIVE

## 2020-01-22 LAB — LIPASE, BLOOD: Lipase: 31 U/L (ref 11–51)

## 2020-01-22 LAB — HEMOGLOBIN A1C
Hgb A1c MFr Bld: 5.5 % (ref 4.8–5.6)
Mean Plasma Glucose: 111.15 mg/dL

## 2020-01-22 MED ORDER — VANCOMYCIN HCL 1500 MG/300ML IV SOLN
1500.0000 mg | Freq: Once | INTRAVENOUS | Status: AC
  Administered 2020-01-22: 1500 mg via INTRAVENOUS
  Filled 2020-01-22: qty 300

## 2020-01-22 MED ORDER — ENOXAPARIN SODIUM 100 MG/ML ~~LOC~~ SOLN
1.0000 mg/kg | Freq: Two times a day (BID) | SUBCUTANEOUS | Status: DC
  Administered 2020-01-22 – 2020-01-26 (×8): 85 mg via SUBCUTANEOUS
  Filled 2020-01-22 (×4): qty 1
  Filled 2020-01-22 (×2): qty 0.85
  Filled 2020-01-22: qty 1
  Filled 2020-01-22: qty 0.85
  Filled 2020-01-22: qty 1
  Filled 2020-01-22: qty 0.85

## 2020-01-22 MED ORDER — ACETAMINOPHEN 325 MG PO TABS
650.0000 mg | ORAL_TABLET | Freq: Once | ORAL | Status: AC
  Administered 2020-01-22: 650 mg via ORAL
  Filled 2020-01-22: qty 2

## 2020-01-22 MED ORDER — SODIUM CHLORIDE 0.9 % IV BOLUS (SEPSIS)
1000.0000 mL | Freq: Once | INTRAVENOUS | Status: AC
  Administered 2020-01-22: 1000 mL via INTRAVENOUS

## 2020-01-22 MED ORDER — ATORVASTATIN CALCIUM 10 MG PO TABS
10.0000 mg | ORAL_TABLET | Freq: Every day | ORAL | Status: DC
  Filled 2020-01-22 (×4): qty 1

## 2020-01-22 MED ORDER — IOHEXOL 300 MG/ML  SOLN
100.0000 mL | Freq: Once | INTRAMUSCULAR | Status: AC | PRN
  Administered 2020-01-22: 100 mL via INTRAVENOUS

## 2020-01-22 MED ORDER — ONDANSETRON HCL 4 MG PO TABS: 4.0000 mg | ORAL_TABLET | Freq: Four times a day (QID) | ORAL | Status: DC | PRN

## 2020-01-22 MED ORDER — METRONIDAZOLE IN NACL 5-0.79 MG/ML-% IV SOLN
500.0000 mg | Freq: Three times a day (TID) | INTRAVENOUS | Status: DC
  Administered 2020-01-22 – 2020-01-29 (×20): 500 mg via INTRAVENOUS
  Filled 2020-01-22 (×20): qty 100

## 2020-01-22 MED ORDER — METOPROLOL TARTRATE 5 MG/5ML IV SOLN
5.0000 mg | Freq: Four times a day (QID) | INTRAVENOUS | Status: DC | PRN
  Administered 2020-01-24: 5 mg via INTRAVENOUS
  Filled 2020-01-22: qty 5

## 2020-01-22 MED ORDER — LACTATED RINGERS IV SOLN: INTRAVENOUS | Status: DC

## 2020-01-22 MED ORDER — SODIUM CHLORIDE 0.9 % IV SOLN
2.0000 g | Freq: Three times a day (TID) | INTRAVENOUS | Status: DC
  Administered 2020-01-22 – 2020-01-29 (×20): 2 g via INTRAVENOUS
  Filled 2020-01-22 (×23): qty 2

## 2020-01-22 MED ORDER — SODIUM CHLORIDE (PF) 0.9 % IJ SOLN
INTRAMUSCULAR | Status: AC
  Filled 2020-01-22: qty 50

## 2020-01-22 MED ORDER — SODIUM CHLORIDE 0.9 % IV SOLN
2.0000 g | Freq: Once | INTRAVENOUS | Status: AC
  Administered 2020-01-22: 2 g via INTRAVENOUS
  Filled 2020-01-22: qty 2

## 2020-01-22 MED ORDER — SODIUM CHLORIDE 0.9 % IV SOLN: 2.0000 g | Freq: Three times a day (TID) | INTRAVENOUS | Status: DC

## 2020-01-22 MED ORDER — ONDANSETRON HCL 4 MG/2ML IJ SOLN
4.0000 mg | Freq: Four times a day (QID) | INTRAMUSCULAR | Status: DC | PRN
  Administered 2020-01-24 – 2020-01-27 (×3): 4 mg via INTRAVENOUS
  Filled 2020-01-22 (×3): qty 2

## 2020-01-22 MED ORDER — METRONIDAZOLE IN NACL 5-0.79 MG/ML-% IV SOLN
500.0000 mg | Freq: Once | INTRAVENOUS | Status: DC
Start: 2020-01-22 — End: 2020-01-22

## 2020-01-22 MED ORDER — METRONIDAZOLE IN NACL 5-0.79 MG/ML-% IV SOLN
500.0000 mg | Freq: Once | INTRAVENOUS | Status: AC
  Administered 2020-01-22: 500 mg via INTRAVENOUS
  Filled 2020-01-22: qty 100

## 2020-01-22 MED ORDER — PANTOPRAZOLE SODIUM 40 MG IV SOLR
40.0000 mg | Freq: Once | INTRAVENOUS | Status: AC
  Administered 2020-01-22: 40 mg via INTRAVENOUS
  Filled 2020-01-22: qty 40

## 2020-01-22 MED ORDER — HYDROMORPHONE HCL 1 MG/ML IJ SOLN
0.5000 mg | INTRAMUSCULAR | Status: DC | PRN
  Administered 2020-01-24 – 2020-01-25 (×4): 1 mg via INTRAVENOUS
  Filled 2020-01-22 (×4): qty 1

## 2020-01-22 MED ORDER — SODIUM CHLORIDE 0.9 % IV BOLUS (SEPSIS)
500.0000 mL | Freq: Once | INTRAVENOUS | Status: AC
  Administered 2020-01-22: 500 mL via INTRAVENOUS

## 2020-01-22 NOTE — ED Triage Notes (Signed)
Pt BIB EMS from home. Pt lives with daughter and wife. Pt recently diagnosed with GI cancer. Pt has not had tx yet. Pt c/o sudden N/V/D. EMS reports tachypnea. Pt has started new meds, seen at [facility] yesterday.  zofran 4 mg IV given by EMS 400 mL NS  HR 120  20G L FA

## 2020-01-22 NOTE — Progress Notes (Signed)
A consult was received from an ED provider for vancomycin per pharmacy dosing.  The patient's profile has been reviewed for ht/wt/allergies/indication/available labs.    A one time order has been placed for vancomycin 1500 mg IV x1   Further antibiotics/pharmacy consults should be ordered by admitting physician if indicated.                       Thank you,   Royetta Asal, PharmD, BCPS 01/22/2020 1:32 PM

## 2020-01-22 NOTE — Progress Notes (Signed)
Pharmacy Antibiotic Note  Drew [name] is a 74 y.o. male admitted on 01/22/2020 with sepsis. Pharmacy has been consulted for Cefepime dosing.  Plan: Cefepime 2g IV q8h Monitor renal function, cultures, clinical course  Height: 5\' 7"  (170.2 cm) Weight: 85.3 kg (188 lb) IBW/kg (Calculated) : 66.1  Temp (24hrs), Avg:101.3 F (38.5 C), Min:99.4 F (37.4 C), Max:103.2 F (39.6 C)  Recent Labs  Lab 01/22/20 0148 01/22/20 1201 01/22/20 1211  WBC  --  7.4  --   CREATININE  --  1.05  --   LATICACIDVEN 2.7*  --  5.3*    Estimated Creatinine Clearance: 66.4 mL/min (by C-G formula based on SCr of 1.05 mg/dL).    No Known Allergies  Antimicrobials this admission: 7/11 Vancomycin x 1 7/11 Cefepime >> 7/11 Metronidazole >>  Dose adjustments this admission: --  Microbiology results: 7/11 BCx: sent 7/11 UCx: sent  7/11 MRSA PCR: ordered  Thank you for allowing pharmacy to be a part of this patient's care.  Luiz Ochoa 01/22/2020 5:20 PM

## 2020-01-22 NOTE — ED Notes (Signed)
Date and time results received: 01/22/20 1:51 PM  (use smartphrase ".now" to insert current time)  Test: Lactic Critical Value: 5.3  Name of Provider Notified: Erlene Quan PA  Orders Received? Or Actions Taken?: Orders Received - See Orders for details

## 2020-01-22 NOTE — Progress Notes (Signed)
A consult was received from an ED provider for cefepime per pharmacy dosing.  The patient's profile has been reviewed for ht/wt/allergies/indication/available labs.    A one time order has been placed for cefepime 2 g IV once.    Further antibiotics/pharmacy consults should be ordered by admitting physician if indicated.                       Thank you, Lenis Noon, PharmD 01/22/2020  12:13 PM

## 2020-01-22 NOTE — H&P (Signed)
History and Physical    Drew [name] JQB:[id number] DOB: [birth year] DOA: 01/22/2020  PCP: Gaynelle Arabian, MD  Patient coming from: Home  I have personally briefly reviewed patient's old medical records in [facility], care everywhere  Chief Complaint: Nausea and vomiting with fever and chills  HPI: Drew [name] is a 74 y.o. 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  He required a stent at the duodenum and his diet had been slowly advanced.  During the completion of the work-up he was found to have bilateral PEs for which she was started on heparin then transition to Eliquis.  He was noted to have iron deficiency anemia thought to be related to an upper GI bleed related to his cancer.  A Port-A-Cath was placed during that time and he has follow-up with [facility] oncology to begin a discussion of possible treatment options on 01/24/2020.  He was doing fine at home and had advance to soft diet and had breakfast this morning.  Following this he developed acute onset of severe nausea and vomiting with diarrhea.  He also had significant chills.  He was brought to the ED for continued work-up and treatment.  ED Course: He was noted to have lactic acid of 2.7 which then went up to 5.3.  A normal white count of 7.4 hemoglobin of 10, a bicarb of 20, and a blood sugar of 162.  CT of the abdomen and pelvis revealed carcinomatosis, diffuse hepatic metastatic disease, ascites, stable gastric tumor with gastric wall stent in place.  Small to moderate sized right pleural effusion with overlying atelectasis.  He was given cefepime plus Flagyl and then vancomycin was added due to a possible infiltrate in the lung.  We are asked to admit for continued work-up  of his sepsis.  Review of Systems: As per HPI otherwise 10 point review of systems negative.  He had a prior 20 pound weight loss.  Past Medical History:  Diagnosis Date  . ED (erectile dysfunction)   . GERD (gastroesophageal reflux disease)   . High cholesterol   . Hypertension   . Hypogonadism male   . Prediabetes   . PUD (peptic ulcer disease)     Past Surgical History:  Procedure Laterality Date  . FINGER AMPUTATION    . TONSILLECTOMY       reports that he has quit smoking. He does not have any smokeless tobacco history on file. He reports current alcohol use of about 10.0 standard drinks of alcohol per week. He reports that he does not use drugs.  No Known Allergies  Family History  Problem Relation Age of Onset  . Diabetes Other   . Prostate cancer Brother        both brothers     Prior to Admission medications   Medication Sig Start Date End Date Taking? Authorizing Provider  atorvastatin (LIPITOR) 10 MG tablet Take 10 mg by mouth daily.   Yes [provider]  ELIQUIS 5 MG TABS tablet Take 5 mg by mouth 2 (two) times daily. 01/20/20  Yes [provider]  FEROSUL 325 (65 Fe) MG tablet Take 325 mg by mouth daily. 01/20/20  Yes [provider]  HYDROcodone-acetaminophen (NORCO/VICODIN) 5-325 MG tablet Take 1 tablet by mouth  every 6 (six) hours as needed for moderate pain.  01/20/20  Yes [provider]  metoprolol succinate (TOPROL-XL) 25 MG 24 hr tablet Take 25 mg by mouth daily. 01/20/20  Yes [provider]  Omega-3 Fatty Acids (FISH OIL) 1000 MG CAPS Take 2 capsules by mouth daily.   Yes [provider]  ondansetron (ZOFRAN) 4 MG tablet Take 4 mg by mouth every 8 (eight) hours as needed for nausea or vomiting.  01/20/20  Yes [provider]  polyethylene glycol (MIRALAX / GLYCOLAX) 17 g packet Take 17 g by mouth daily.   Yes [provider]  tamsulosin (FLOMAX) 0.4 MG CAPS capsule Take 0.4 mg by mouth  daily. 12/22/19  Yes [provider]  acetaminophen (TYLENOL) 500 MG tablet Take 1,000 mg by mouth every 6 (six) hours as needed for mild pain. Patient not taking: Reported on 01/22/2020    [provider]  aspirin EC 81 MG tablet Take 81 mg by mouth daily. Patient not taking: Reported on 01/22/2020    [provider]  atorvastatin (LIPITOR) 10 MG tablet Take 10 mg by mouth daily. Patient not taking: Reported on 01/22/2020    [provider]  cyclobenzaprine (FLEXERIL) 5 MG tablet Take 1 tablet (5 mg total) by mouth at bedtime. Patient not taking: Reported on 11/19/2019 12/02/17   Jaynee Eagles, PA-C  esomeprazole (NEXIUM) 40 MG capsule Take 40 mg by mouth daily. Patient not taking: Reported on 01/22/2020 12/22/19   [provider]  metoprolol (LOPRESSOR) 50 MG tablet Take 50 mg by mouth daily. Patient not taking: Reported on 01/22/2020    [provider]  traMADol-acetaminophen (ULTRACET) 37.5-325 MG tablet Take 1 tablet by mouth every 8 (eight) hours as needed for severe pain. Patient not taking: Reported on 11/19/2019 12/02/17   Jaynee Eagles, PA-C    Physical Exam: Vitals:   01/22/20 1600 01/22/20 1630 01/22/20 1730 01/22/20 1749  BP: 113/69 126/73 122/70   Pulse: (!) 109 (!) 110 (!) 104   Resp: (!) 26 (!) 24 18   Temp:    98.5 F (36.9 C)  TempSrc:    Oral  SpO2: 95% 96% 95%   Weight:      Height:        Constitutional: NAD, calm, comfortable Eyes: PERRL, lids and conjunctivae normal ENMT: Mucous membranes are moist. Posterior pharynx clear of any exudate or lesions.Normal dentition.  Neck: normal, supple, no masses, no thyromegaly Respiratory: clear to auscultation bilaterally, no wheezing, no crackles. Normal respiratory effort. No accessory muscle use.  Cardiovascular: Regular rate and rhythm, no murmurs / rubs / gallops. No extremity edema. 2+ pedal pulses. No carotid bruits.  Abdomen: no tenderness, no masses palpated.  Protuberant.  Bowel sounds positive.  Musculoskeletal: no clubbing / cyanosis. No joint deformity upper and lower extremities. Good ROM, no contractures. Normal muscle tone.  Skin: no rashes, lesions, ulcers. No induration Neurologic: CN 2-12 grossly intact. Sensation intact, DTR normal. Strength 5/5 in all 4.  Psychiatric: Normal judgment and insight. Alert and oriented x 3. Normal mood.   Labs on Admission: I have personally reviewed following labs and imaging studies  CBC: Recent Labs  Lab 01/22/20 1201  WBC 7.4  NEUTROABS 6.7  HGB 10.0*  HCT 32.3*  MCV 91.8  PLT 852   Basic Metabolic Panel: Recent Labs  Lab 01/22/20 1201  NA 136  K 3.8  CL 102  CO2 20*  GLUCOSE 162*  BUN 9  CREATININE 1.05  CALCIUM 8.8*   GFR: Estimated Creatinine Clearance: 66.4 mL/min (by C-G formula based on SCr of 1.05 mg/dL). Liver Function Tests: Recent Labs  Lab 01/22/20 1201  AST 58*  ALT 26  ALKPHOS 110  BILITOT 0.6  PROT 6.8  ALBUMIN 2.8*   Recent Labs  Lab 01/22/20 1201  LIPASE 31   Coagulation Profile: Recent Labs  Lab 01/22/20 1201  INR 1.4*   Urine analysis:    Component Value Date/Time   COLORURINE YELLOW 01/22/2020 1201   APPEARANCEUR CLEAR 01/22/2020 1201   LABSPEC >1.046 (H) 01/22/2020 1201   PHURINE 6.0 01/22/2020 1201   GLUCOSEU NEGATIVE 01/22/2020 1201   HGBUR NEGATIVE 01/22/2020 1201   BILIRUBINUR NEGATIVE 01/22/2020 1201   [place] 01/22/2020 1201   PROTEINUR NEGATIVE 01/22/2020 1201   UROBILINOGEN 0.2 09/25/2011 2206   NITRITE NEGATIVE 01/22/2020 1201   LEUKOCYTESUR NEGATIVE 01/22/2020 1201    Radiological Exams on Admission: CT ABDOMEN PELVIS W CONTRAST  Result Date: 01/22/2020 CLINICAL DATA:  Known gastric cancer. Nausea, vomiting and diarrhea. EXAM: CT ABDOMEN AND PELVIS WITH CONTRAST TECHNIQUE: Multidetector CT imaging of the abdomen and pelvis was performed using the standard protocol following bolus administration of intravenous contrast.  CONTRAST:  174mL OMNIPAQUE IOHEXOL 300 MG/ML  SOLN COMPARISON:  CT scan 11/19/2019 FINDINGS: Lower chest: Small to moderate-sized right pleural effusion is noted with overlying atelectasis. The left lung base is clear. No worrisome pulmonary nodules to suggest pulmonary metastatic disease. The heart is normal in size. No pericardial effusion. There is a new enlarged epicardial lymph node measuring 10 mm on image 14/2 Hepatobiliary: Interval development of diffuse hepatic metastatic disease with numerous lesions throughout both lobes. Segment 2 lesion measures 16 mm. Segment 5 lesion on image 21/2 measures 20 mm. Gallbladder is unremarkable.  No common bile duct dilatation. Pancreas: No mass, inflammation or ductal dilatation. Spleen: Normal size.  No focal lesions. Adrenals/Urinary Tract: The adrenal glands and kidneys are unremarkable and stable. Small scattered renal cysts are unchanged. The bladder is unremarkable. Stomach/Bowel: There is a gastric wall stent in place. Gastric lesion appears unchanged. Perigastric adenopathy is again noted. The duodenum, small bowel and colon are grossly normal without oral contrast. No obstructive findings are demonstrated. Vascular/Lymphatic: The aorta and branch vessels are stable. Moderate scattered atherosclerotic calcifications. No aneurysm or dissection. The branch vessels are patent. The major venous structures are patent. Unfortunately, there has been interval development of diffuse peritoneal carcinomatosis with innumerable peritoneal and omental implants and associated ascites. Reproductive: The prostate gland and seminal vesicles are unremarkable. Other: Small amount of free pelvic fluid and moderate abdominal fluid. Musculoskeletal: No significant bony findings. No evidence of osseous metastatic disease. IMPRESSION: 1. Interval rapid development of diffuse hepatic metastatic disease and diffuse peritoneal carcinomatosis with innumerable peritoneal and omental  implants and associated ascites. 2. Gastric wall stent in place.  Stable appearing gastric tumor. 3. Small to moderate-sized right pleural effusion with overlying atelectasis. 4. No findings for pulmonary metastatic disease. Aortic Atherosclerosis (ICD10-I70.0). Electronically Signed   By: Marijo Sanes M.D.   On: 01/22/2020 14:48   DG Chest Port 1 View  Result Date: 01/22/2020 CLINICAL DATA:  Sepsis.  Tachycardia and tachypnea. EXAM: PORTABLE CHEST 1 VIEW COMPARISON:  September 25, 2011 FINDINGS: A new right Port-A-Cath terminates in central SVC. No pneumothorax. The left lung is clear. The cardiomediastinal silhouette is normal. Mild opacity in the medial right lung base. No other acute abnormalities. IMPRESSION: Mild opacity in the medial right lung base may  represent vascular crowding, atelectasis, or early subtle infiltrate. Recommend clinical correlation and attention on follow-up. Electronically Signed   By: Dorise Bullion III M.D   On: 01/22/2020 13:09   DG Abd Portable 1 View  Result Date: 01/22/2020 CLINICAL DATA:  Sepsis EXAM: PORTABLE ABDOMEN - 1 VIEW COMPARISON:  CT scan Nov 19, 2019 FINDINGS: There is a stent in the right upper quadrant. There is a paucity of bowel gas limiting evaluation but no evidence of obstruction. No free air, portal venous gas, or pneumatosis. IMPRESSION: No cause for the patient's sepsis identified. Some sort of stent is present in the right upper quadrant. This may be within the distal stomach or proximal duodenum. Electronically Signed   By: Dorise Bullion III M.D   On: 01/22/2020 13:12    EKG: Independently reviewed.  Sinus tachycardia, no ST-T wave changes  Assessment/Plan Principal Problem:   Sepsis ([place]) Active Problems:   Acute blood loss anemia   Acute upper GI bleed   CVA (cerebral vascular accident) ([place])   Gastric adenocarcinoma ([place])   Gastric outlet obstruction   Metastasis ([facility])   Pleural effusion, right   Pulmonary embolism ([facility])   Severe  protein-calorie malnutrition ([place])   Sepsis Multiple sources could be implicated.  Patient has indwelling Port-A-Cath, recent hospitalization, GI malignancy, and findings on chest x-ray.  His urinalysis is clear. He is status post Vanco and cefepime plus Flagyl x1 Changed to cefepime plus Flagyl for most likely source given his symptoms of nausea and vomiting as intra-abdominal. Blood cultures are pending  Acute blood loss anemia Hemoglobin is stable at 10  Upper GI bleed No blood in his emesis today  Recent CVA Found in his work-up for metastatic disease, no focal neurologic findings.  On Lipitor  Metastatic gastric adenocarcinoma Is plugged in with [facility] I have called oncology for the to weigh in though they are not sure they have much to add here.  Gastric outlet obstruction status post stenting Has stent in place N.p.o. for now we will slowly advance diet if he tolerates ice chips Zofran as needed  Pleural effusion Appears unchanged compared to CT in Care Everywhere from [place]  Pulmonary embolism Given n.p.o. status have held his Eliquis, will change to 1 mg/kg Lovenox twice daily  Severe protein calorie malnutrition  DVT prophylaxis: Lovenox SQ Code Status: Full code confirmed with both he and his daughter.  They are hopeful Family Communication: Patient and daughter at bedside.  Patient is retired Theme park manager.  His wife is at home has COPD.  His daughter is from [place] and is a Chief Executive Officer Disposition Plan: Hopeful for home Consults called: Oncology Dr. Alvy Bimler Admission status: Observation   Donnamae Jude MD [facility]  If 7PM-7AM, please contact night-coverage 01/22/2020, 6:24 PM

## 2020-01-22 NOTE — ED Notes (Signed)
Pt given ice chips per Dr.Pratt approval.  Pt and family advised to let staff know if he gets nauseated and/or vomits.

## 2020-01-22 NOTE — ED Provider Notes (Signed)
[facility] DEPT Provider Note   CSN: [id number] Arrival date & time: 01/22/20  1132     History No chief complaint on file.   Drew [name] is a 74 y.o. male history of metastatic gastric cancer, GERD, hypertension, PUD, CVA, PE, gastric outlet obstruction with recent stent placement, anemia.  Patient presents today for chills nausea vomiting and diarrhea that began this morning shortly after waking up.  He has not measured his temperature at home but reports shaking chills.  He reports multiple episodes of nonbloody/nonbilious emesis and nonbloody diarrhea this morning as well.  He is unable to tolerate p.o. at home so he called EMS, he feels somewhat improved since receiving fluids and Zofran by EMS.  Denies fall/injury, headache, sore throat, chest pain/shortness of breath, cough, abdominal pain, hematemesis, melena, hematochezia, extremity swelling/color change, nausea/tingling, weakness or any additional concerns.   HPI     Past Medical History:  Diagnosis Date  . ED (erectile dysfunction)   . GERD (gastroesophageal reflux disease)   . High cholesterol   . Hypertension   . Hypogonadism male   . Prediabetes   . PUD (peptic ulcer disease)     There are no problems to display for this patient.   Past Surgical History:  Procedure Laterality Date  . FINGER AMPUTATION    . TONSILLECTOMY         Family History  Problem Relation Age of Onset  . Diabetes Other   . Prostate cancer Brother        both brothers    Social History   Tobacco Use  . Smoking status: Former Smoker  Substance Use Topics  . Alcohol use: Yes    Alcohol/week: 10.0 standard drinks    Types: 10 Glasses of wine per week  . Drug use: No    Home Medications Prior to Admission medications   Medication Sig Start Date End Date Taking? Authorizing Provider  atorvastatin (LIPITOR) 10 MG tablet Take 10 mg by mouth daily.   Yes [provider]  ELIQUIS 5  MG TABS tablet Take 5 mg by mouth 2 (two) times daily. 01/20/20  Yes [provider]  FEROSUL 325 (65 Fe) MG tablet Take 325 mg by mouth daily. 01/20/20  Yes [provider]  HYDROcodone-acetaminophen (NORCO/VICODIN) 5-325 MG tablet Take 1 tablet by mouth every 6 (six) hours as needed for moderate pain.  01/20/20  Yes [provider]  metoprolol succinate (TOPROL-XL) 25 MG 24 hr tablet Take 25 mg by mouth daily. 01/20/20  Yes [provider]  Omega-3 Fatty Acids (FISH OIL) 1000 MG CAPS Take 2 capsules by mouth daily.   Yes [provider]  ondansetron (ZOFRAN) 4 MG tablet Take 4 mg by mouth every 8 (eight) hours as needed for nausea or vomiting.  01/20/20  Yes [provider]  polyethylene glycol (MIRALAX / GLYCOLAX) 17 g packet Take 17 g by mouth daily.   Yes [provider]  tamsulosin (FLOMAX) 0.4 MG CAPS capsule Take 0.4 mg by mouth daily. 12/22/19  Yes [provider]  acetaminophen (TYLENOL) 500 MG tablet Take 1,000 mg by mouth every 6 (six) hours as needed for mild pain. Patient not taking: Reported on 01/22/2020    [provider]  aspirin EC 81 MG tablet Take 81 mg by mouth daily. Patient not taking: Reported on 01/22/2020    [provider]  atorvastatin (LIPITOR) 10 MG tablet Take 10 mg by mouth daily. Patient not taking:  Reported on 01/22/2020    [provider]  cyclobenzaprine (FLEXERIL) 5 MG tablet Take 1 tablet (5 mg total) by mouth at bedtime. Patient not taking: Reported on 11/19/2019 12/02/17   Jaynee Eagles, PA-C  esomeprazole (NEXIUM) 40 MG capsule Take 40 mg by mouth daily. Patient not taking: Reported on 01/22/2020 12/22/19   [provider]  metoprolol (LOPRESSOR) 50 MG tablet Take 50 mg by mouth daily. Patient not taking: Reported on 01/22/2020    [provider]  traMADol-acetaminophen (ULTRACET) 37.5-325 MG tablet Take 1 tablet by mouth every 8 (eight) hours as needed for  severe pain. Patient not taking: Reported on 11/19/2019 12/02/17   Jaynee Eagles, PA-C    Allergies    Patient has no known allergies.  Review of Systems   Review of Systems Ten systems are reviewed and are negative for acute change except as noted in the HPI  Physical Exam Updated Vital Signs BP 128/77   Pulse (!) 113   Temp (!) 103.2 F (39.6 C) (Rectal)   Resp (!) 23   Ht 5\' 7"  (1.702 m)   Wt 85.3 kg   SpO2 94%   BMI 29.44 kg/m   Physical Exam Constitutional:      General: He is not in acute distress.    Appearance: Normal appearance. He is well-developed. He is ill-appearing. He is not diaphoretic.  HENT:     Head: Normocephalic and atraumatic.  Eyes:     General: Vision grossly intact. Gaze aligned appropriately.     Pupils: Pupils are equal, round, and reactive to light.  Neck:     Trachea: Trachea and phonation normal.  Pulmonary:     Effort: Pulmonary effort is normal. No respiratory distress.  Abdominal:     General: There is no distension.     Palpations: Abdomen is soft.     Tenderness: There is no abdominal tenderness. There is no guarding or rebound.  Musculoskeletal:        General: Normal range of motion.     Cervical back: Normal range of motion.  Skin:    General: Skin is warm and dry.  Neurological:     Mental Status: He is alert.     GCS: GCS eye subscore is 4. GCS verbal subscore is 5. GCS motor subscore is 6.     Comments: Speech is clear and goal oriented, follows commands Major Cranial nerves without deficit, no facial droop Moves extremities without ataxia, coordination intact  Psychiatric:        Behavior: Behavior normal.     ED Results / Procedures / Treatments   Labs (all labs ordered are listed, but only abnormal results are displayed) Labs Reviewed  CBC WITH DIFFERENTIAL/PLATELET - Abnormal; Notable for the following components:      Result Value   RBC 3.52 (*)    Hemoglobin 10.0 (*)    HCT 32.3 (*)    RDW 16.6 (*)    Lymphs  Abs 0.4 (*)    Abs Immature Granulocytes 0.08 (*)    All other components within normal limits  COMPREHENSIVE METABOLIC PANEL - Abnormal; Notable for the following components:   CO2 20 (*)    Glucose, Bld 162 (*)    Calcium 8.8 (*)    Albumin 2.8 (*)    AST 58 (*)    All other components within normal limits  LACTIC ACID, PLASMA - Abnormal; Notable for the following components:   Lactic Acid, Venous 5.3 (*)  All other components within normal limits  LACTIC ACID, PLASMA - Abnormal; Notable for the following components:   Lactic Acid, Venous 2.7 (*)    All other components within normal limits  PROTIME-INR - Abnormal; Notable for the following components:   Prothrombin Time 16.1 (*)    INR 1.4 (*)    All other components within normal limits  SARS CORONAVIRUS 2 BY RT PCR (HOSPITAL ORDER, [facility])  CULTURE, BLOOD (ROUTINE X 2)  CULTURE, BLOOD (ROUTINE X 2)  URINE CULTURE  LIPASE, BLOOD  APTT  URINALYSIS, ROUTINE W REFLEX MICROSCOPIC  TROPONIN I (HIGH SENSITIVITY)  TROPONIN I (HIGH SENSITIVITY)    EKG None  Radiology CT ABDOMEN PELVIS W CONTRAST  Result Date: 01/22/2020 CLINICAL DATA:  Known gastric cancer. Nausea, vomiting and diarrhea. EXAM: CT ABDOMEN AND PELVIS WITH CONTRAST TECHNIQUE: Multidetector CT imaging of the abdomen and pelvis was performed using the standard protocol following bolus administration of intravenous contrast. CONTRAST:  158mL OMNIPAQUE IOHEXOL 300 MG/ML  SOLN COMPARISON:  CT scan 11/19/2019 FINDINGS: Lower chest: Small to moderate-sized right pleural effusion is noted with overlying atelectasis. The left lung base is clear. No worrisome pulmonary nodules to suggest pulmonary metastatic disease. The heart is normal in size. No pericardial effusion. There is a new enlarged epicardial lymph node measuring 10 mm on image 14/2 Hepatobiliary: Interval development of diffuse hepatic metastatic disease with numerous lesions  throughout both lobes. Segment 2 lesion measures 16 mm. Segment 5 lesion on image 21/2 measures 20 mm. Gallbladder is unremarkable.  No common bile duct dilatation. Pancreas: No mass, inflammation or ductal dilatation. Spleen: Normal size.  No focal lesions. Adrenals/Urinary Tract: The adrenal glands and kidneys are unremarkable and stable. Small scattered renal cysts are unchanged. The bladder is unremarkable. Stomach/Bowel: There is a gastric wall stent in place. Gastric lesion appears unchanged. Perigastric adenopathy is again noted. The duodenum, small bowel and colon are grossly normal without oral contrast. No obstructive findings are demonstrated. Vascular/Lymphatic: The aorta and branch vessels are stable. Moderate scattered atherosclerotic calcifications. No aneurysm or dissection. The branch vessels are patent. The major venous structures are patent. Unfortunately, there has been interval development of diffuse peritoneal carcinomatosis with innumerable peritoneal and omental implants and associated ascites. Reproductive: The prostate gland and seminal vesicles are unremarkable. Other: Small amount of free pelvic fluid and moderate abdominal fluid. Musculoskeletal: No significant bony findings. No evidence of osseous metastatic disease. IMPRESSION: 1. Interval rapid development of diffuse hepatic metastatic disease and diffuse peritoneal carcinomatosis with innumerable peritoneal and omental implants and associated ascites. 2. Gastric wall stent in place.  Stable appearing gastric tumor. 3. Small to moderate-sized right pleural effusion with overlying atelectasis. 4. No findings for pulmonary metastatic disease. Aortic Atherosclerosis (ICD10-I70.0). Electronically Signed   By: Marijo Sanes M.D.   On: 01/22/2020 14:48   DG Chest Port 1 View  Result Date: 01/22/2020 CLINICAL DATA:  Sepsis.  Tachycardia and tachypnea. EXAM: PORTABLE CHEST 1 VIEW COMPARISON:  September 25, 2011 FINDINGS: A new right  Port-A-Cath terminates in central SVC. No pneumothorax. The left lung is clear. The cardiomediastinal silhouette is normal. Mild opacity in the medial right lung base. No other acute abnormalities. IMPRESSION: Mild opacity in the medial right lung base may represent vascular crowding, atelectasis, or early subtle infiltrate. Recommend clinical correlation and attention on follow-up. Electronically Signed   By: Dorise Bullion III M.D   On: 01/22/2020 13:09   DG Abd Portable 1 View  Result  Date: 01/22/2020 CLINICAL DATA:  Sepsis EXAM: PORTABLE ABDOMEN - 1 VIEW COMPARISON:  CT scan Nov 19, 2019 FINDINGS: There is a stent in the right upper quadrant. There is a paucity of bowel gas limiting evaluation but no evidence of obstruction. No free air, portal venous gas, or pneumatosis. IMPRESSION: No cause for the patient's sepsis identified. Some sort of stent is present in the right upper quadrant. This may be within the distal stomach or proximal duodenum. Electronically Signed   By: Dorise Bullion III M.D   On: 01/22/2020 13:12    Procedures .Critical Care Performed by: Deliah Boston, PA-C Authorized by: Deliah Boston, PA-C   Critical care provider statement:    Critical care time (minutes):  45   Critical care was necessary to treat or prevent imminent or life-threatening deterioration of the following conditions:  Sepsis   Critical care was time spent personally by me on the following activities:  Discussions with consultants, evaluation of patient's response to treatment, examination of patient, ordering and performing treatments and interventions, ordering and review of laboratory studies, ordering and review of radiographic studies, pulse oximetry, re-evaluation of patient's condition, obtaining history from patient or surrogate, review of old charts and development of treatment plan with patient or surrogate   (including critical care time)  Medications Ordered in ED Medications    sodium chloride 0.9 % bolus 1,000 mL (1,000 mLs Intravenous New Bag/Given 01/22/20 1346)    And  sodium chloride 0.9 % bolus 1,000 mL (0 mLs Intravenous Stopped 01/22/20 1348)    And  sodium chloride 0.9 % bolus 500 mL (has no administration in time range)  vancomycin (VANCOREADY) IVPB 1500 mg/300 mL (1,500 mg Intravenous New Bag/Given 01/22/20 1458)  sodium chloride (PF) 0.9 % injection (has no administration in time range)  pantoprazole (PROTONIX) injection 40 mg (40 mg Intravenous Given 01/22/20 1237)  ceFEPIme (MAXIPIME) 2 g in sodium chloride 0.9 % 100 mL IVPB (0 g Intravenous Stopped 01/22/20 1459)  metroNIDAZOLE (FLAGYL) IVPB 500 mg (0 mg Intravenous Stopped 01/22/20 1459)  iohexol (OMNIPAQUE) 300 MG/ML solution 100 mL (100 mLs Intravenous Contrast Given 01/22/20 1405)  acetaminophen (TYLENOL) tablet 650 mg (650 mg Oral Given 01/22/20 1459)    ED Course  I have reviewed the triage vital signs and the nursing notes.  Pertinent labs & imaging results that were available during my care of the patient were reviewed by me and considered in my medical decision making (see chart for details).  Clinical Course as of Jan 21 1517  Sun Jan 22, 2020  1513 Dr. Kennon Rounds   [BM]    Clinical Course User Index [BM] Gari Crown   MDM Rules/Calculators/A&P                          Additional History Obtained: 1. Nursing notes from this visit. 2. EMR and care everywhere reviewed.  Reviewed patient's [facility] summary.  Patient on Eliquis started after he had pulmonary embolisms this June.  Patient reports compliance with that therapy.  Patient had labs obtained yesterday at [place] [facility], show normal white blood cell count of 8.1, anemia of 8.5.  Metabolic panel nonemergent.  Type and screen O+ antibody negative.  It appears patient received blood transfusion on July 5?.  Port-A-Cath placed on July 4.  Echocardiogram on July 1 showed EF 60-65%.  Patient had endoscopy placement of stent across  narrowing and gastric mass within the distal stomach on  June 29.  It appears multiple small acute and subacute infarctions are found on MRI on June 29.  Pulmonary emboli were diagnosed via chest CT on June 29. --------------------------------- Initial evaluation, 74 year old male history as detailed above presents today for nausea vomiting diarrhea and chills onset this morning.  He reports he was in his normal state of health last night.  He denies any associated pain.  400 mL normal saline and 4 mg Zofran given by EMS.  On arrival patient is tachycardic, no hypotension or hypoxia, oral temperature 99.4 F.  I asked nursing staff to obtain a rectal temperature which revealed fever of 103.2 F.  Code sepsis initiated, 2.5 L normal saline fluid bolus ordered.  Empiric antibiotics for intra-abdominal coverage, Flagyl/cefepime ordered.  Protonix ordered.  Will obtain chest x-ray and abdominal x-ray given history of of gastric cancer.  Will obtain CT abdomen pelvis, patient is no abdominal tenderness on exam however given history and recent procedures feel further imaging is indicated. Case discussed with Dr. Wilson Singer. ---------------- I ordered, reviewed and interpreted labs which include: CBC shows no leukocytosis, hemoglobin of 10.0 appears slightly improved from yesterday. Lipase within normal limits, doubt pancreatitis. CMP shows no emergent electrolyte derangement, AKI, emergent elevation of LFTs or gap. Lactic 5.3. Covid test negative. High-sensitivity troponin within normal limits. PT/INR and APTT nonemergent.  CXR:  IMPRESSION:  Mild opacity in the medial right lung base may represent vascular  crowding, atelectasis, or early subtle infiltrate. Recommend  clinical correlation and attention on follow-up.   Abd Xray:  IMPRESSION:  No cause for the patient's sepsis identified. Some sort of stent is  present in the right upper quadrant. This may be within the distal  stomach or proximal  duodenum.   CTAP:  IMPRESSION:  1. Interval rapid development of diffuse hepatic metastatic disease  and diffuse peritoneal carcinomatosis with innumerable peritoneal  and omental implants and associated ascites.  2. Gastric wall stent in place. Stable appearing gastric tumor.  3. Small to moderate-sized right pleural effusion with overlying  atelectasis.  4. No findings for pulmonary metastatic disease.    Aortic Atherosclerosis (ICD10-I70.0).  ----- After initial evaluation patient reported chest pain, EKG was subsequently obtained by nursing staff and reviewed with Dr. Wilson Singer, showed no acute ischemic changes.  Troponin was added, initial troponin is within normal limits.  Delta troponin is pending.  Patient was started on cefepime and Flagyl, chest x-ray showed possible early pneumonia and vancomycin was added.  Patient's tachycardia gradually improved during ED course, he was reevaluated multiple times and clinically remains stable and in no acute distress.  Chest pain now resolved.  Patient will need admission for further treatment of sepsis possibly due to healthcare acquired pneumonia.  Patient and his daughter at bedside are agreeable to plan of care.  Case was discussed with Dr. Kennon Rounds and patient was accepted to hospitalist service.   Note: Portions of this report may have been transcribed using voice recognition software. Every effort was made to ensure accuracy; however, inadvertent computerized transcription errors may still be present. Final Clinical Impression(s) / ED Diagnoses Final diagnoses:  Sepsis, due to unspecified organism, unspecified whether acute organ dysfunction present ([place])  HCAP (healthcare-associated pneumonia)    Rx / DC Orders ED Discharge Orders    None       Gari Crown 01/22/20 1518    Virgel Manifold, MD 01/29/20 1127

## 2020-01-22 NOTE — ED Provider Notes (Signed)
EKG:  Rhythm: sinus tachycardia Rate: 116 PR:  145 ms QRS: 73 ms QTc: 448 ms ST segments: NS ST changes    Virgel Manifold, MD 01/22/20 1336
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
## 2020-01-23 ENCOUNTER — Encounter (HOSPITAL_COMMUNITY): Payer: Self-pay | Admitting: Family Medicine

## 2020-01-23 ENCOUNTER — Inpatient Hospital Stay (HOSPITAL_COMMUNITY): Payer: Medicare PPO

## 2020-01-23 DIAGNOSIS — A419 Sepsis, unspecified organism: Principal | ICD-10-CM

## 2020-01-23 DIAGNOSIS — R6521 Severe sepsis with septic shock: Secondary | ICD-10-CM | POA: Diagnosis not present

## 2020-01-23 LAB — BASIC METABOLIC PANEL
Anion gap: 8 (ref 5–15)
BUN: 10 mg/dL (ref 8–23)
CO2: 24 mmol/L (ref 22–32)
Calcium: 8 mg/dL — ABNORMAL LOW (ref 8.9–10.3)
Chloride: 106 mmol/L (ref 98–111)
Creatinine, Ser: 0.82 mg/dL (ref 0.61–1.24)
GFR calc Af Amer: >60 mL/min (ref >60)
GFR calc non Af Amer: >60 mL/min (ref >60)
Glucose, Bld: 111 mg/dL — ABNORMAL HIGH (ref 70–99)
Potassium: 4 mmol/L (ref 3.5–5.1)
Sodium: 138 mmol/L (ref 135–145)

## 2020-01-23 LAB — PROTIME-INR
INR: 1.4 — ABNORMAL HIGH (ref 0.8–1.2)
Prothrombin Time: 17 seconds — ABNORMAL HIGH (ref 11.4–15.2)

## 2020-01-23 LAB — CBC
HCT: 29.3 % — ABNORMAL LOW (ref 39.0–52.0)
Hemoglobin: 8.9 g/dL — ABNORMAL LOW (ref 13.0–17.0)
MCH: 28.3 pg (ref 26.0–34.0)
MCHC: 30.4 g/dL (ref 30.0–36.0)
MCV: 93 fL (ref 80.0–100.0)
Platelets: 304 10*3/uL (ref 150–400)
RBC: 3.15 MIL/uL — ABNORMAL LOW (ref 4.22–5.81)
RDW: 17 % — ABNORMAL HIGH (ref 11.5–15.5)
WBC: 14.3 10*3/uL — ABNORMAL HIGH (ref 4.0–10.5)
nRBC: 0 % (ref 0.0–0.2)

## 2020-01-23 LAB — URINE CULTURE: Culture: NO GROWTH

## 2020-01-23 LAB — LACTIC ACID, PLASMA: Lactic Acid, Venous: 1.1 mmol/L (ref 0.5–1.9)

## 2020-01-23 MED ORDER — ACETAMINOPHEN 325 MG PO TABS
650.0000 mg | ORAL_TABLET | Freq: Four times a day (QID) | ORAL | Status: DC | PRN
  Administered 2020-01-23 – 2020-01-24 (×3): 650 mg via ORAL
  Filled 2020-01-23 (×3): qty 2

## 2020-01-23 MED ORDER — FUROSEMIDE 10 MG/ML IJ SOLN
40.0000 mg | Freq: Once | INTRAMUSCULAR | Status: AC
  Administered 2020-01-23: 40 mg via INTRAVENOUS
  Filled 2020-01-23: qty 4

## 2020-01-23 MED ORDER — VANCOMYCIN HCL 1500 MG/300ML IV SOLN
1500.0000 mg | Freq: Once | INTRAVENOUS | Status: AC
  Administered 2020-01-23: 1500 mg via INTRAVENOUS
  Filled 2020-01-23: qty 300

## 2020-01-23 MED ORDER — VANCOMYCIN HCL 1250 MG/250ML IV SOLN
1250.0000 mg | Freq: Two times a day (BID) | INTRAVENOUS | Status: DC
  Filled 2020-01-23: qty 250

## 2020-01-23 MED ORDER — VANCOMYCIN HCL 1250 MG/250ML IV SOLN
1250.0000 mg | Freq: Two times a day (BID) | INTRAVENOUS | Status: DC
  Administered 2020-01-24 – 2020-01-27 (×7): 1250 mg via INTRAVENOUS
  Filled 2020-01-23 (×7): qty 250

## 2020-01-23 MED ORDER — CHLORHEXIDINE GLUCONATE CLOTH 2 % EX PADS
6.0000 | MEDICATED_PAD | Freq: Every day | CUTANEOUS | Status: DC
  Administered 2020-01-23 – 2020-01-29 (×7): 6 via TOPICAL

## 2020-01-23 NOTE — Progress Notes (Addendum)
Brief Oncology Note:  We were notified of this patient's admission by admitting hospitalist.  Review of outside records shows that he was recently diagnosed with metastatic gastric adenocarcinoma.  He has already been seen by medical oncology at [facility] as an inpatient with plans to follow-up as an outpatient with them this week and begin chemotherapy in the near future.  Records indicate that they recommend FOLFOX/Herceptin.  The patient was admitted to our hospital with sepsis likely due to multiple sources.  The patient has been having recent fevers and chills.  Chest x-ray concerning for possible pneumonia.  CT of the abdomen pelvis showed diffuse hepatic metastatic disease with diffuse peritoneal carcinomatosis and innumerable peritoneal and omental implants with associated ascites.  He also has a gastric wall stent in place and a stable appearing gastric tumor.  I met with the patient and his daughter who was at the bedside.  We discussed that there is no urgent need for oncology consultation in our health system given that he is already established with [facility].  He would like to follow-up with them as an outpatient.  We discussed that his acute infection/sepsis needs to be treated and resolved before proceeding with chemotherapy.  I have recommended for the patient's daughter to contact his oncologist to notify them of admission and request that they reschedule his appointment to next week.  The patient and his daughter are aware that we are available if any acute oncology issues arise during his hospitalization.  Please call medical oncology for any urgent oncological needs this admission.  Mikey Bussing, DNP, AGPCNP-BC, AOCNP Mon/Tues/Thurs/Fri 7am-5pm; Off Wednesdays Cell: [phone]

## 2020-01-23 NOTE — Progress Notes (Signed)
   01/23/20 1517  Assess: MEWS Score  Temp (!) 102.2 F (39 C)  BP (!) 177/87  Pulse Rate (!) 109  Resp 16  Level of Consciousness Alert  SpO2 96 %  Assess: MEWS Score  MEWS Temp 2  MEWS Systolic 0  MEWS Pulse 1  MEWS RR 0  MEWS LOC 0  MEWS Score 3  MEWS Score Color Yellow  Assess: if the MEWS score is Yellow or Red  Were vital signs taken at a resting state? Yes  Focused Assessment Documented focused assessment  Early Detection of Sepsis Score *See Row Information* High  MEWS guidelines implemented *See Row Information* No, previously yellow, continue vital signs every 4 hours  Treat  MEWS Interventions Escalated (See documentation below)  Escalate  MEWS: Escalate Yellow: discuss with charge nurse/RN and consider discussing with provider and RRT  Notify: Charge Nurse/RN  Name of Charge Nurse/RN Notified [email or web address]  Date Charge Nurse/RN Notified 01/23/20  Time Charge Nurse/RN Notified 58  Notify: Provider  Provider Name/Title Antonieta Pert MD  Date Provider Notified 01/23/20  Time Provider Notified 1520  Notification Type Page  Notification Reason Other (Comment)  Document  Patient Outcome Stabilized after interventions   Pt transferred from ICU and had triggered Yellow MEWS prior to transfer. MD entered orders for additional antibiotics as well as acetaminophen, both of which were administered.  Pt temp at 101.5 at this time.  Will continue to monitor vitals q4h per protocol.

## 2020-01-23 NOTE — Plan of Care (Signed)
  Problem: Education: Goal: Knowledge of General Education information will improve Description: Including pain rating scale, medication(s)/side effects and non-pharmacologic comfort measures Outcome: Progressing   Problem: Elimination: Goal: Will not experience complications related to bowel motility Outcome: Progressing Goal: Will not experience complications related to urinary retention Outcome: Progressing   Problem: Safety: Goal: Ability to remain free from injury will improve Outcome: Progressing   

## 2020-01-23 NOTE — Progress Notes (Signed)
Pharmacy Antibiotic Note  Drew [name] is a 74 y.o. male admitted on 01/22/2020 with sepsis. Pharmacy was originally consulted for Cefepime dosing, but with new fevers > 24 hr into therapy, Pharmacy has been consulted to add vancomycin for sepsis, possibly d/t PNA.  Plan:  MRSA pneumonia seems unlikely given negative PCR from just yesterday, but given source still not fully clear; will start vancomycin to cover any potential source:  Vancomycin 1500 mg IV x 1, followed by 1250 mg IV q12 hr  Measure vancomycin trough at steady state as needed  Continue cefepime 2g IV q8h  Monitor renal function, cultures, clinical course  Height: 5\' 7"  (170.2 cm) Weight: 85.3 kg (188 lb) IBW/kg (Calculated) : 66.1  Temp (24hrs), Avg:98.9 F (37.2 C), Min:97.9 F (36.6 C), Max:102.2 F (39 C)  Recent Labs  Lab 01/22/20 0148 01/22/20 1201 01/22/20 1211 01/23/20 0152 01/23/20 0812  WBC  --  7.4  --  14.3*  --   CREATININE  --  1.05  --  0.82  --   LATICACIDVEN 2.7*  --  5.3*  --  1.1    Estimated Creatinine Clearance: 85 mL/min (by C-G formula based on SCr of 0.82 mg/dL).    No Known Allergies  Antimicrobials this admission: 7/11 Vancomycin x 1, resume 7/12 >>  7/11 Cefepime >> 7/11 Metronidazole >>  Dose adjustments this admission: --  Microbiology results: 7/11 BCx: ngtd 7/11 UCx: NGF 7/11 MRSA PCR: neg  Thank you for allowing pharmacy to be a part of this patient's care.  Anacaren Kohan A 01/23/2020 3:54 PM

## 2020-01-23 NOTE — Progress Notes (Signed)
°   01/23/20 2248  Assess: MEWS Score  Temp 100 F (37.8 C)  BP (!) 169/93  Pulse Rate (!) 118  Resp (!) 28  SpO2 93 %  O2 Device Room Air  Assess: MEWS Score  MEWS Temp 0  MEWS Systolic 0  MEWS Pulse 2  MEWS RR 2  MEWS LOC 0  MEWS Score 4  MEWS Score Color Red  Assess: if the MEWS score is Yellow or Red  Were vital signs taken at a resting state? Yes  Focused Assessment Documented focused assessment  Early Detection of Sepsis Score *See Row Information* High  MEWS guidelines implemented *See Row Information* Yes  Treat  MEWS Interventions Administered scheduled meds/treatments;Other (Comment) (Rapid response notified)  Take Vital Signs  Increase Vital Sign Frequency  Red: Q 1hr X 4 then Q 4hr X 4, if remains red, continue Q 4hrs  Escalate  MEWS: Escalate Red: discuss with charge nurse/RN and provider, consider discussing with RRT  Notify: Charge Nurse/RN  Name of Charge Nurse/RN Notified Meredith,RN  Date Charge Nurse/RN Notified 01/23/20  Time Charge Nurse/RN Notified 2248  Notify: Provider  Provider Name/Title M.Sharlet Salina  Date Provider Notified 01/23/20  Time Provider Notified 2251  Notification Type Page Shea Evans)  Notification Reason Change in status  Date of Provider Response 01/23/20  Time of Provider Response 2255  Notify: Rapid Response  Name of Rapid Response RN Notified Mitchell,RN  Date Rapid Response Notified 01/23/20  Time Rapid Response Notified 2248  Document  Patient Outcome Other (Comment) (Labs/x-rays and med ordered. will continue to monitor)  Progress note created (see row info) Yes     01/23/20 2248  Assess: MEWS Score  Temp 100 F (37.8 C)  BP (!) 169/93  Pulse Rate (!) 118  Resp (!) 28  SpO2 93 %  O2 Device Room Air  Assess: MEWS Score  MEWS Temp 0  MEWS Systolic 0  MEWS Pulse 2  MEWS RR 2  MEWS LOC 0  MEWS Score 4  MEWS Score Color Red  Assess: if the MEWS score is Yellow or Red  Were vital signs taken at a resting state? Yes   Focused Assessment Documented focused assessment  Early Detection of Sepsis Score *See Row Information* High  MEWS guidelines implemented *See Row Information* Yes  Treat  MEWS Interventions Administered scheduled meds/treatments;Other (Comment) (Rapid response notified)  Take Vital Signs  Increase Vital Sign Frequency  Red: Q 1hr X 4 then Q 4hr X 4, if remains red, continue Q 4hrs  Escalate  MEWS: Escalate Red: discuss with charge nurse/RN and provider, consider discussing with RRT  Notify: Charge Nurse/RN  Name of Charge Nurse/RN Notified Meredith,RN  Date Charge Nurse/RN Notified 01/23/20  Time Charge Nurse/RN Notified 2248  Notify: Provider  Provider Name/Title M.Sharlet Salina  Date Provider Notified 01/23/20  Time Provider Notified 2251  Notification Type Page Shea Evans)  Notification Reason Change in status  Date of Provider Response 01/23/20  Time of Provider Response 2255  Notify: Rapid Response  Name of Rapid Response RN Notified Mitchell,RN  Date Rapid Response Notified 01/23/20  Time Rapid Response Notified 2248  Document  Patient Outcome Other (Comment) (Labs/x-rays and med ordered. will continue to monitor)  Progress note created (see row info) Yes
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
## 2020-01-23 NOTE — Plan of Care (Signed)
Pt transferred to unit as Yellow MEWS.  MD notified and q4h vitals continued per protocol.  Pt VS have improved at this time.  Will continue to monitor.   Problem: Education: Goal: Knowledge of General Education information will improve Description: Including pain rating scale, medication(s)/side effects and non-pharmacologic comfort measures Outcome: Progressing   Problem: Health Behavior/Discharge Planning: Goal: Ability to manage health-related needs will improve Outcome: Progressing   Problem: Clinical Measurements: Goal: Ability to maintain clinical measurements within normal limits will improve Outcome: Progressing Goal: Will remain free from infection Outcome: Progressing Goal: Diagnostic test results will improve Outcome: Progressing Goal: Respiratory complications will improve Outcome: Progressing Goal: Cardiovascular complication will be avoided Outcome: Progressing   Problem: Activity: Goal: Risk for activity intolerance will decrease Outcome: Progressing   Problem: Coping: Goal: Level of anxiety will decrease Outcome: Progressing   Problem: Elimination: Goal: Will not experience complications related to bowel motility Outcome: Progressing Goal: Will not experience complications related to urinary retention Outcome: Progressing   Problem: Pain Managment: Goal: General experience of comfort will improve Outcome: Progressing   Problem: Safety: Goal: Ability to remain free from injury will improve Outcome: Progressing   Problem: Skin Integrity: Goal: Risk for impaired skin integrity will decrease Outcome: Progressing   Problem: Nutrition: Goal: Adequate nutrition will be maintained Outcome: Not Progressing

## 2020-01-23 NOTE — Progress Notes (Signed)
   01/23/20 1517  Assess: MEWS Score  Temp (!) 102.2 F (39 C)  BP (!) 177/87  Pulse Rate (!) 109  Resp 16  Level of Consciousness Alert  SpO2 96 %  Assess: MEWS Score  MEWS Temp 2  MEWS Systolic 0  MEWS Pulse 1  MEWS RR 0  MEWS LOC 0  MEWS Score 3  MEWS Score Color Yellow  Assess: if the MEWS score is Yellow or Red  Were vital signs taken at a resting state? Yes  Focused Assessment Documented focused assessment  Early Detection of Sepsis Score *See Row Information* High  MEWS guidelines implemented *See Row Information* No, previously yellow, continue vital signs every 4 hours  Treat  MEWS Interventions Escalated (See documentation below)  Escalate  MEWS: Escalate Yellow: discuss with charge nurse/RN and consider discussing with provider and RRT  Notify: Charge Nurse/RN  Name of Charge Nurse/RN Notified kim rn  Date Charge Nurse/RN Notified 01/23/20  Time Charge Nurse/RN Notified 13  Notify: Provider  Provider Name/Title Antonieta Pert MD  Date Provider Notified 01/23/20  Time Provider Notified 1520  Notification Type Page  Notification Reason Other (Comment)  Response See new orders  Date of Provider Response 01/23/20  Time of Provider Response 1530  Document  Patient Outcome Stabilized after interventions  Progress note created (see row info) Yes     vanc and tylenol added, WCTM

## 2020-01-24 ENCOUNTER — Inpatient Hospital Stay (HOSPITAL_COMMUNITY): Payer: Medicare PPO

## 2020-01-24 DIAGNOSIS — A419 Sepsis, unspecified organism: Secondary | ICD-10-CM | POA: Diagnosis not present

## 2020-01-24 DIAGNOSIS — R6521 Severe sepsis with septic shock: Secondary | ICD-10-CM | POA: Diagnosis not present

## 2020-01-24 LAB — LACTIC ACID, PLASMA
Lactic Acid, Venous: 1.2 mmol/L (ref 0.5–1.9)
Lactic Acid, Venous: 1.1 mmol/L (ref 0.5–1.9)
Lactic Acid, Venous: 1.5 mmol/L (ref 0.5–1.9)

## 2020-01-24 LAB — COMPREHENSIVE METABOLIC PANEL
ALT: 27 U/L (ref 0–44)
AST: 67 U/L — ABNORMAL HIGH (ref 15–41)
Albumin: 2.7 g/dL — ABNORMAL LOW (ref 3.5–5.0)
Alkaline Phosphatase: 86 U/L (ref 38–126)
Anion gap: 10 (ref 5–15)
BUN: 10 mg/dL (ref 8–23)
CO2: 22 mmol/L (ref 22–32)
Calcium: 8.1 mg/dL — ABNORMAL LOW (ref 8.9–10.3)
Chloride: 102 mmol/L (ref 98–111)
Creatinine, Ser: 0.8 mg/dL (ref 0.61–1.24)
GFR calc Af Amer: >60 mL/min (ref >60)
GFR calc non Af Amer: >60 mL/min (ref >60)
Glucose, Bld: 150 mg/dL — ABNORMAL HIGH (ref 70–99)
Potassium: 3.1 mmol/L — ABNORMAL LOW (ref 3.5–5.1)
Sodium: 134 mmol/L — ABNORMAL LOW (ref 135–145)
Total Bilirubin: 1 mg/dL (ref 0.3–1.2)
Total Protein: 6.5 g/dL (ref 6.5–8.1)

## 2020-01-24 LAB — BASIC METABOLIC PANEL
Anion gap: 12 (ref 5–15)
BUN: 10 mg/dL (ref 8–23)
CO2: 21 mmol/L — ABNORMAL LOW (ref 22–32)
Calcium: 8.1 mg/dL — ABNORMAL LOW (ref 8.9–10.3)
Chloride: 102 mmol/L (ref 98–111)
Creatinine, Ser: 0.77 mg/dL (ref 0.61–1.24)
GFR calc Af Amer: >60 mL/min (ref >60)
GFR calc non Af Amer: >60 mL/min (ref >60)
Glucose, Bld: 107 mg/dL — ABNORMAL HIGH (ref 70–99)
Potassium: 4.4 mmol/L (ref 3.5–5.1)
Sodium: 135 mmol/L (ref 135–145)

## 2020-01-24 LAB — DIFFERENTIAL
Abs Immature Granulocytes: 0.06 10*3/uL (ref 0.00–0.07)
Basophils Absolute: 0 10*3/uL (ref 0.0–0.1)
Basophils Relative: 0 %
Eosinophils Absolute: 0 10*3/uL (ref 0.0–0.5)
Eosinophils Relative: 0 %
Immature Granulocytes: 0 %
Lymphocytes Relative: 6 %
Lymphs Abs: 0.8 10*3/uL (ref 0.7–4.0)
Monocytes Absolute: 0.9 10*3/uL (ref 0.1–1.0)
Monocytes Relative: 7 %
Neutro Abs: 12 10*3/uL — ABNORMAL HIGH (ref 1.7–7.7)
Neutrophils Relative %: 87 %

## 2020-01-24 LAB — CBC
HCT: 30.4 % — ABNORMAL LOW (ref 39.0–52.0)
Hemoglobin: 9.5 g/dL — ABNORMAL LOW (ref 13.0–17.0)
MCH: 27.8 pg (ref 26.0–34.0)
MCHC: 31.3 g/dL (ref 30.0–36.0)
MCV: 88.9 fL (ref 80.0–100.0)
Platelets: 299 10*3/uL (ref 150–400)
RBC: 3.42 MIL/uL — ABNORMAL LOW (ref 4.22–5.81)
RDW: 16.7 % — ABNORMAL HIGH (ref 11.5–15.5)
WBC: 13.8 10*3/uL — ABNORMAL HIGH (ref 4.0–10.5)
nRBC: 0 % (ref 0.0–0.2)

## 2020-01-24 LAB — MAGNESIUM: Magnesium: 1.8 mg/dL (ref 1.7–2.4)

## 2020-01-24 MED ORDER — SENNOSIDES 8.8 MG/5ML PO SYRP
10.0000 mL | ORAL_SOLUTION | Freq: Two times a day (BID) | ORAL | Status: DC
  Administered 2020-01-24 – 2020-01-28 (×9): 10 mL via ORAL
  Filled 2020-01-24 (×14): qty 10

## 2020-01-24 MED ORDER — PANTOPRAZOLE SODIUM 40 MG IV SOLR
40.0000 mg | Freq: Two times a day (BID) | INTRAVENOUS | Status: DC
  Administered 2020-01-24 – 2020-01-29 (×11): 40 mg via INTRAVENOUS
  Filled 2020-01-24 (×11): qty 40

## 2020-01-24 MED ORDER — ACETAMINOPHEN 650 MG RE SUPP: 650.0000 mg | Freq: Four times a day (QID) | RECTAL | Status: DC | PRN

## 2020-01-24 MED ORDER — POTASSIUM CHLORIDE 20 MEQ/15ML (10%) PO SOLN
40.0000 meq | Freq: Three times a day (TID) | ORAL | Status: DC
  Administered 2020-01-24 – 2020-01-25 (×3): 40 meq
  Filled 2020-01-24 (×3): qty 30

## 2020-01-24 MED ORDER — METOPROLOL TARTRATE 5 MG/5ML IV SOLN
5.0000 mg | Freq: Four times a day (QID) | INTRAVENOUS | Status: DC
  Administered 2020-01-24 – 2020-01-25 (×3): 5 mg via INTRAVENOUS
  Filled 2020-01-24 (×3): qty 5

## 2020-01-24 MED ORDER — POTASSIUM CHLORIDE 10 MEQ/100ML IV SOLN
10.0000 meq | INTRAVENOUS | Status: AC
  Administered 2020-01-24 (×2): 10 meq via INTRAVENOUS
  Filled 2020-01-24: qty 100

## 2020-01-24 MED ORDER — PHENOL 1.4 % MT LIQD
1.0000 | OROMUCOSAL | Status: DC | PRN
  Filled 2020-01-24: qty 177

## 2020-01-24 MED ORDER — ACETAMINOPHEN 325 MG PO TABS
650.0000 mg | ORAL_TABLET | Freq: Four times a day (QID) | ORAL | Status: DC | PRN
  Administered 2020-01-26 – 2020-01-29 (×3): 650 mg via ORAL
  Filled 2020-01-24 (×3): qty 2

## 2020-01-24 MED ORDER — POTASSIUM CHLORIDE 10 MEQ/100ML IV SOLN
10.0000 meq | INTRAVENOUS | Status: AC
  Administered 2020-01-24: 10 meq via INTRAVENOUS
  Filled 2020-01-24: qty 100

## 2020-01-24 NOTE — Consult Note (Addendum)
Referring Provider: Dr. Antonieta Pert ([facility]) Primary Care Physician:  Gaynelle Arabian, MD Primary Gastroenterologist:  Althia Forts  Reason for Consultation: Ileus  HPI: Drew [name] is a 74 y.o. 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  Patient reports worsening abdominal distention and discomfort over the last few days.  He had nausea and vomiting last night.  He has been n.p.o. today and has not felt as nauseated and has had any more episodes of emesis.  Reports his last bowel movement was on Sunday 7/11.  He has been unable to pass flatus the last two days.  He took a laxative and had several loose stools.  He also had nausea and vomiting that day as well.  Denies any recent hematemesis, though notes he did have episodes of hematemesis prior to stent placement.  Patient has had persistent weight loss of at least 20 pounds.  He has a history of GERD and PUD.  Denies any dysphagia, recent melena, or hematochezia.  Abdominal x-ray 7/12: Increased air within small bowel and colon, suggesting ileus. Gastroduodenal stent in the right upper quadrant, unchanged in position from CT yesterday.   CT 7/11: Interval rapid development of diffuse hepatic metastatic disease and diffuse peritoneal carcinomatosis with innumerable peritoneal and omental implants and associated ascites. Gastric wall stent in place.  Stable appearing gastric tumor.  Past Medical History:  Diagnosis Date  . ED (erectile dysfunction)   . GERD (gastroesophageal reflux disease)   . High cholesterol   . Hypertension   . Hypogonadism male   . Prediabetes   . PUD (peptic ulcer disease)     Past Surgical History:  Procedure Laterality Date  . FINGER AMPUTATION    . TONSILLECTOMY       Prior to Admission medications   Medication Sig Start Date End Date Taking? Authorizing Provider  atorvastatin (LIPITOR) 10 MG tablet Take 10 mg by mouth daily.   Yes [provider]  ELIQUIS 5 MG TABS tablet Take 5 mg by mouth 2 (two) times daily. 01/20/20  Yes [provider]  FEROSUL 325 (65 Fe) MG tablet Take 325 mg by mouth daily. 01/20/20  Yes [provider]  HYDROcodone-acetaminophen (NORCO/VICODIN) 5-325 MG tablet Take 1 tablet by mouth every 6 (six) hours as needed for moderate pain.  01/20/20  Yes [provider]  metoprolol succinate (TOPROL-XL) 25 MG 24 hr tablet Take 25 mg by mouth daily. 01/20/20  Yes [provider]  Omega-3 Fatty Acids (FISH OIL) 1000 MG CAPS Take 2 capsules by mouth daily.   Yes [provider]  ondansetron (ZOFRAN) 4 MG tablet Take 4 mg by mouth every 8 (eight) hours as needed for nausea or vomiting.  01/20/20  Yes [provider]  polyethylene glycol (MIRALAX / GLYCOLAX) 17 g packet Take 17 g by mouth daily.   Yes [provider]  tamsulosin (FLOMAX) 0.4 MG CAPS capsule Take 0.4 mg by mouth daily. 12/22/19  Yes [provider]  aspirin EC 81 MG tablet Take 81 mg by mouth daily. Patient not taking: Reported on 01/22/2020    [provider]    Current Facility-Administered Medications  Medication Dose Route Frequency Provider Last Rate Last Admin  . acetaminophen (TYLENOL) tablet 650 mg  650 mg Oral Q6H PRN Antonieta Pert, MD   650 mg at  01/24/20 1010  . atorvastatin (LIPITOR) tablet 10 mg  10 mg Oral q1800 Donnamae Jude, MD      . ceFEPIme (MAXIPIME) 2 g in sodium chloride 0.9 % 100 mL IVPB  2 g Intravenous Q8H Luiz Ochoa, RPH 200 mL/hr at 01/24/20 0637 2 g at 01/24/20 0637  . Chlorhexidine Gluconate Cloth 2 % PADS 6 each  6 each Topical Daily Jackelyn Knife, MD   6 each at 01/23/20 0939  . enoxaparin (LOVENOX) injection 85 mg  1 mg/kg Subcutaneous Q12H Donnamae Jude,  MD   85 mg at 01/24/20 1012  . HYDROmorphone (DILAUDID) injection 0.5-1 mg  0.5-1 mg Intravenous Q2H PRN Donnamae Jude, MD   1 mg at 01/24/20 0525  . lactated ringers infusion   Intravenous Continuous Donnamae Jude, MD 125 mL/hr at 01/24/20 0315 New Bag at 01/24/20 0315  . metoprolol tartrate (LOPRESSOR) injection 5 mg  5 mg Intravenous Q6H PRN Lang Snow, FNP   5 mg at 01/24/20 0645  . metroNIDAZOLE (FLAGYL) IVPB 500 mg  500 mg Intravenous Q8H Donnamae Jude, MD 100 mL/hr at 01/24/20 0531 500 mg at 01/24/20 0531  . ondansetron (ZOFRAN) tablet 4 mg  4 mg Oral Q6H PRN Donnamae Jude, MD       Or  . ondansetron [facility]) injection 4 mg  4 mg Intravenous Q6H PRN Donnamae Jude, MD   4 mg at 01/24/20 0523  . potassium chloride 10 mEq in 100 mL IVPB  10 mEq Intravenous Q1 Hr x 2 Kc, Ramesh, MD 100 mL/hr at 01/24/20 1018 10 mEq at 01/24/20 1018  . sennosides (SENOKOT) 8.8 MG/5ML syrup 10 mL  10 mL Oral BID Lang Snow, FNP   10 mL at 01/24/20 0634  . vancomycin (VANCOREADY) IVPB 1250 mg/250 mL  1,250 mg Intravenous BID Polly Cobia, RPH 166.7 mL/hr at 01/24/20 0552 1,250 mg at 01/24/20 0552    Allergies as of 01/22/2020  . (No Known Allergies)    Family History  Problem Relation Age of Onset  . Diabetes Other   . Prostate cancer Brother        both brothers    Social History   Socioeconomic History  . Marital status: Married    Spouse name: Not on file  . Number of children: Not on file  . Years of education: Not on file  . Highest education level: Not on file  Occupational History  . Not on file  Tobacco Use  . Smoking status: Former Research scientist (life sciences)  . Smokeless tobacco: Never Used  Substance and Sexual Activity  . Alcohol use: Yes    Alcohol/week: 10.0 standard drinks    Types: 10 Glasses of wine per week  . Drug use: No  . Sexual activity: Not on file  Other Topics Concern  . Not on file  Social History Narrative  . Not on file   Social Determinants of Health    Financial Resource Strain:   . Difficulty of Paying Living Expenses:   Food Insecurity:   . Worried About Charity fundraiser in the Last Year:   . Arboriculturist in the Last Year:   Transportation Needs:   . Film/video editor (Medical):   Marland Kitchen Lack of Transportation (Non-Medical):   Physical Activity:   . Days of Exercise per Week:   . Minutes of Exercise per Session:   Stress:   . Feeling of Stress :   Social Connections:   .  Frequency of Communication with Friends and Family:   . Frequency of Social Gatherings with Friends and Family:   . Attends Religious Services:   . Active Member of Clubs or Organizations:   . Attends Archivist Meetings:   Marland Kitchen Marital Status:   Intimate Partner Violence:   . Fear of Current or Ex-Partner:   . Emotionally Abused:   Marland Kitchen Physically Abused:   . Sexually Abused:     Review of Systems: Review of Systems  Constitutional: Positive for chills, malaise/fatigue and weight loss. Negative for fever.  HENT: Negative for hearing loss and tinnitus.   Respiratory: Negative for cough and shortness of breath.   Cardiovascular: Negative for chest pain and palpitations.  Gastrointestinal: Positive for abdominal pain, constipation, heartburn, nausea and vomiting. Negative for blood in stool, diarrhea and melena.  Genitourinary: Negative for flank pain and hematuria.  Musculoskeletal: Negative for falls and joint pain.  Skin: Negative for itching and rash.  Neurological: Negative for seizures and loss of consciousness.  Endo/Heme/Allergies: Negative for polydipsia. Does not bruise/bleed easily.  Psychiatric/Behavioral: Negative for substance abuse. The patient is not nervous/anxious.     Physical Exam: Vital signs in last 24 hours: Temp:  [98.6 F (37 C)-102.2 F (39 C)] 99.4 F (37.4 C) (07/13 0955) Pulse Rate:  [101-124] 107 (07/13 0955) Resp:  [16-28] 18 (07/13 0955) BP: (143-177)/(63-93) 159/93 (07/13 0955) SpO2:  [90 %-97 %] 94 %  (07/13 0955) Last BM Date: 01/22/20 (per pt) General:  Alert,  Well-developed, well-nourished, pleasant and cooperative in NAD Head: Normocephalic and atraumatic. Eyes: Sclera clear, no icterus.   Mild conjunctival pallor. Mouth:  No ulcerations or lesions.  Oropharynx pink & moist. Neck:  No masses or thyromegaly. Lungs: Clear throughout to auscultation.   No wheezes, crackles, or rhonchi. No evident respiratory distress. Heart:  Regular rate and rhythm; no murmurs, clicks, rubs,  or gallops. Abdomen: Firm and moderately distended; tympany upon percussion of the upper abdomen.  Mild epigastric tenderness. Hypoactive bowel sounds.   Msk:  Symmetrical without gross deformities. Pulses: Normal pulses noted. Extremities:  Without clubbing, cyanosis, or edema. Neurologic: Alert and coherent;  grossly normal neurologically. Skin: Intact without significant lesions or rashes. Cervical Nodes: No significant cervical adenopathy. Psych:  Alert and cooperative. Normal mood and affect.  Intake/Output from previous day: 07/12 0701 - 07/13 0700 In: 1683.4 [P.O.:480; I.V.:1088.2; IV Piggyback:115.2] Out: 3100 [Urine:3100] Intake/Output this shift: No intake/output data recorded.  Lab Results: Recent Labs    01/22/20 1201 01/23/20 0152 01/24/20 0101  WBC 7.4 14.3* 13.8*  HGB 10.0* 8.9* 9.5*  HCT 32.3* 29.3* 30.4*  PLT 400 304 299   BMET Recent Labs    01/22/20 1201 01/23/20 0152 01/24/20 0101  NA 136 138 134*  K 3.8 4.0 3.1*  CL 102 106 102  CO2 20* 24 22  GLUCOSE 162* 111* 150*  BUN 9 10 10   CREATININE 1.05 0.82 0.80  CALCIUM 8.8* 8.0* 8.1*   LFT Recent Labs    01/24/20 0101  PROT 6.5  ALBUMIN 2.7*  AST 67*  ALT 27  ALKPHOS 86  BILITOT 1.0   PT/INR Recent Labs    01/22/20 1201 01/23/20 0152  LABPROT 16.1* 17.0*  INR 1.4* 1.4*    Studies/Results: CT ABDOMEN PELVIS W CONTRAST  Result Date: 01/22/2020 CLINICAL DATA:  Known gastric cancer. Nausea, vomiting and  diarrhea. EXAM: CT ABDOMEN AND PELVIS WITH CONTRAST TECHNIQUE: Multidetector CT imaging of the abdomen and pelvis was performed using the  standard protocol following bolus administration of intravenous contrast. CONTRAST:  14mL OMNIPAQUE IOHEXOL 300 MG/ML  SOLN COMPARISON:  CT scan 11/19/2019 FINDINGS: Lower chest: Small to moderate-sized right pleural effusion is noted with overlying atelectasis. The left lung base is clear. No worrisome pulmonary nodules to suggest pulmonary metastatic disease. The heart is normal in size. No pericardial effusion. There is a new enlarged epicardial lymph node measuring 10 mm on image 14/2 Hepatobiliary: Interval development of diffuse hepatic metastatic disease with numerous lesions throughout both lobes. Segment 2 lesion measures 16 mm. Segment 5 lesion on image 21/2 measures 20 mm. Gallbladder is unremarkable.  No common bile duct dilatation. Pancreas: No mass, inflammation or ductal dilatation. Spleen: Normal size.  No focal lesions. Adrenals/Urinary Tract: The adrenal glands and kidneys are unremarkable and stable. Small scattered renal cysts are unchanged. The bladder is unremarkable. Stomach/Bowel: There is a gastric wall stent in place. Gastric lesion appears unchanged. Perigastric adenopathy is again noted. The duodenum, small bowel and colon are grossly normal without oral contrast. No obstructive findings are demonstrated. Vascular/Lymphatic: The aorta and branch vessels are stable. Moderate scattered atherosclerotic calcifications. No aneurysm or dissection. The branch vessels are patent. The major venous structures are patent. Unfortunately, there has been interval development of diffuse peritoneal carcinomatosis with innumerable peritoneal and omental implants and associated ascites. Reproductive: The prostate gland and seminal vesicles are unremarkable. Other: Small amount of free pelvic fluid and moderate abdominal fluid. Musculoskeletal: No significant bony  findings. No evidence of osseous metastatic disease. IMPRESSION: 1. Interval rapid development of diffuse hepatic metastatic disease and diffuse peritoneal carcinomatosis with innumerable peritoneal and omental implants and associated ascites. 2. Gastric wall stent in place.  Stable appearing gastric tumor. 3. Small to moderate-sized right pleural effusion with overlying atelectasis. 4. No findings for pulmonary metastatic disease. Aortic Atherosclerosis (ICD10-I70.0). Electronically Signed   By: Marijo Sanes M.D.   On: 01/22/2020 14:48   DG Chest Port 1 View  Result Date: 01/22/2020 CLINICAL DATA:  Sepsis.  Tachycardia and tachypnea. EXAM: PORTABLE CHEST 1 VIEW COMPARISON:  September 25, 2011 FINDINGS: A new right Port-A-Cath terminates in central SVC. No pneumothorax. The left lung is clear. The cardiomediastinal silhouette is normal. Mild opacity in the medial right lung base. No other acute abnormalities. IMPRESSION: Mild opacity in the medial right lung base may represent vascular crowding, atelectasis, or early subtle infiltrate. Recommend clinical correlation and attention on follow-up. Electronically Signed   By: Dorise Bullion III M.D   On: 01/22/2020 13:09   DG Abd Portable 1V  Result Date: 01/23/2020 CLINICAL DATA:  Abdominal pain and swelling. EXAM: PORTABLE ABDOMEN - 1 VIEW COMPARISON:  Abdominal CT yesterday. FINDINGS: Gastroduodenal stent in the right upper quadrant appears in similar position to CT yesterday. Increased air within small bowel loops in the lower abdomen. Increased air throughout the ascending and transverse colon. No evidence of free air on this single view. No abnormal soft tissue calcifications. IMPRESSION: Increased air within small bowel and colon, suggesting ileus. Gastroduodenal stent in the right upper quadrant, unchanged in position from CT yesterday. Electronically Signed   By: Keith Rake M.D.   On: 01/23/2020 23:45   DG Abd Portable 1 View  Result Date:  01/22/2020 CLINICAL DATA:  Sepsis EXAM: PORTABLE ABDOMEN - 1 VIEW COMPARISON:  CT scan Nov 19, 2019 FINDINGS: There is a stent in the right upper quadrant. There is a paucity of bowel gas limiting evaluation but no evidence of obstruction. No free air, portal venous  gas, or pneumatosis. IMPRESSION: No cause for the patient's sepsis identified. Some sort of stent is present in the right upper quadrant. This may be within the distal stomach or proximal duodenum. Electronically Signed   By: Dorise Bullion III M.D   On: 01/22/2020 13:12    Impression: Ileus as seen on abdominal x-ray 7/12. Characterized by abdominal distention and nausea/vomiting. -Hypokalemic: potassium 3.1 today  Metastatic gastric adenocarcinoma with gastric outlet obstruction s/p gastric-duodenal stent placement on 01/12/2020 -Hgb 9.5 (baseline of 13.8 11/2019)  Plan: Recommend NGT placement with LIWS.  Keep NPO.  While nose bleeding could occur in the setting of Lovenox use, in this case, the benefit of NGT placement in the setting of ileus outweighs the risk.  Protonix 40mg  IV BID.  Keep potassium above 4 and magnesium above 2.  Minimize narcotics at the present time, if possible.  [facility] will follow.   LOS: 2 days   Salley Slaughter  01/24/2020, 11:23 AM   Pager [phone] If no answer or after 5 PM call [phone]

## 2020-01-24 NOTE — Progress Notes (Signed)
   01/24/20 2038  Assess: MEWS Score  Temp 99.1 F (37.3 C)  BP (!) 161/90  Pulse Rate (!) 113  Resp 20  SpO2 93 %  O2 Device Room Air  Assess: MEWS Score  MEWS Temp 0  MEWS Systolic 0  MEWS Pulse 2  MEWS RR 0  MEWS LOC 0  MEWS Score 2  MEWS Score Color Yellow  Assess: if the MEWS score is Yellow or Red  Were vital signs taken at a resting state? Yes  Focused Assessment Documented focused assessment  MEWS guidelines implemented *See Row Information* No, previously yellow, continue vital signs every 4 hours  Treat  MEWS Interventions Administered scheduled meds/treatments  Escalate  MEWS: Escalate Yellow: discuss with charge nurse/RN and consider discussing with provider and RRT  Notify: Charge Nurse/RN  Name of Charge Nurse/RN Notified Pam, RN  Date Charge Nurse/RN Notified 01/24/20  Time Charge Nurse/RN Notified [id number]

## 2020-01-24 NOTE — Progress Notes (Signed)
Rapid response call to rm 1610 r/t abdominal distention and increased MEWS score.    On assessment pt had RR of 24, temp 100.2, O2 sats of 95% on RA, BP 159/87 and a HR of 110 ST.  Patient A/Ox4, abdomen noticeably distended with c/o tenderness.  Patient reports being unable to pass gas.  Patients chart reviewed and assessments completed.  On call provider notified.  Orders placed for stat abdominal xray, and to draw a lactic and CBC.  Provider will be notified of results.

## 2020-01-24 NOTE — Progress Notes (Signed)
Pt was assisted to [facility] for c/o B.M. Mr Garbers did pass some gas,however, no B.M. States he is feeling better at this time. Will continue to monitor
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
## 2020-01-25 ENCOUNTER — Inpatient Hospital Stay (HOSPITAL_COMMUNITY): Payer: Medicare PPO

## 2020-01-25 DIAGNOSIS — R6521 Severe sepsis with septic shock: Secondary | ICD-10-CM | POA: Diagnosis not present

## 2020-01-25 DIAGNOSIS — A419 Sepsis, unspecified organism: Secondary | ICD-10-CM | POA: Diagnosis not present

## 2020-01-25 LAB — COMPREHENSIVE METABOLIC PANEL
ALT: 20 U/L (ref 0–44)
AST: 36 U/L (ref 15–41)
Albumin: 2.2 g/dL — ABNORMAL LOW (ref 3.5–5.0)
Alkaline Phosphatase: 71 U/L (ref 38–126)
Anion gap: 12 (ref 5–15)
BUN: 13 mg/dL (ref 8–23)
CO2: 21 mmol/L — ABNORMAL LOW (ref 22–32)
Calcium: 8.2 mg/dL — ABNORMAL LOW (ref 8.9–10.3)
Chloride: 103 mmol/L (ref 98–111)
Creatinine, Ser: 0.74 mg/dL (ref 0.61–1.24)
GFR calc Af Amer: >60 mL/min (ref >60)
GFR calc non Af Amer: >60 mL/min (ref >60)
Glucose, Bld: 119 mg/dL — ABNORMAL HIGH (ref 70–99)
Potassium: 4.3 mmol/L (ref 3.5–5.1)
Sodium: 136 mmol/L (ref 135–145)
Total Bilirubin: 0.7 mg/dL (ref 0.3–1.2)
Total Protein: 5.7 g/dL — ABNORMAL LOW (ref 6.5–8.1)

## 2020-01-25 LAB — CBC
HCT: 28.9 % — ABNORMAL LOW (ref 39.0–52.0)
Hemoglobin: 9 g/dL — ABNORMAL LOW (ref 13.0–17.0)
MCH: 28 pg (ref 26.0–34.0)
MCHC: 31.1 g/dL (ref 30.0–36.0)
MCV: 89.8 fL (ref 80.0–100.0)
Platelets: 357 10*3/uL (ref 150–400)
RBC: 3.22 MIL/uL — ABNORMAL LOW (ref 4.22–5.81)
RDW: 16.5 % — ABNORMAL HIGH (ref 11.5–15.5)
WBC: 14.4 10*3/uL — ABNORMAL HIGH (ref 4.0–10.5)
nRBC: 0 % (ref 0.0–0.2)

## 2020-01-25 MED ORDER — METOPROLOL TARTRATE 25 MG PO TABS
25.0000 mg | ORAL_TABLET | Freq: Two times a day (BID) | ORAL | Status: DC
  Administered 2020-01-25 – 2020-01-29 (×9): 25 mg via ORAL
  Filled 2020-01-25 (×9): qty 1

## 2020-01-25 MED ORDER — BOOST / RESOURCE BREEZE PO LIQD CUSTOM
1.0000 | Freq: Three times a day (TID) | ORAL | Status: DC
  Administered 2020-01-26 – 2020-01-29 (×10): 1 via ORAL

## 2020-01-25 MED ORDER — HYDROCODONE-ACETAMINOPHEN 7.5-325 MG PO TABS
1.0000 | ORAL_TABLET | Freq: Four times a day (QID) | ORAL | Status: DC | PRN
  Administered 2020-01-25 – 2020-01-27 (×2): 1 via ORAL
  Filled 2020-01-25 (×2): qty 1

## 2020-01-25 MED ORDER — METOPROLOL TARTRATE 5 MG/5ML IV SOLN: 5.0000 mg | Freq: Four times a day (QID) | INTRAVENOUS | Status: DC | PRN

## 2020-01-25 NOTE — Care Management Important Message (Signed)
Important Message  Patient Details IM Letter given to Gabriel Earing RN Case Manager to present to the Patient Name: Drew [name] MRN: [id number] Date of Birth: 05/02/1946   Medicare Important Message Given:  Yes     Kerin Salen 01/25/2020, 9:50 AM

## 2020-01-25 NOTE — Progress Notes (Signed)
Patient will be seen by our gastrointestinal oncologist Dr.Sherill tomorrow.

## 2020-01-25 NOTE — Progress Notes (Signed)
[facility] Progress Note  Drew [name] 74 y.o. 1946-04-01  CC:  ileus  Subjective: Patient reports improvement in abdominal discomfort and states he had a bowel movement yesterday after his NGT was placed.  Reports nasal and throat discomfort related to NGT.  ROS : Review of Systems  HENT: Positive for sore throat. Negative for nosebleeds.   Gastrointestinal: Positive for abdominal pain. Negative for blood in stool, constipation, diarrhea, heartburn, melena, nausea and vomiting.   Objective: Vital signs in last 24 hours: Vitals:   01/24/20 2350 01/25/20 0545  BP: (!) 161/84 (!) 155/83  Pulse: (!) 111 (!) 109  Resp: 18 18  Temp: 99 F (37.2 C) 99.9 F (37.7 C)  SpO2: 90% 93%    Physical Exam:  General:  Alert, oriented, cooperative, no acute distress; NG tube in place  Head:  Normocephalic, without obvious abnormality, atraumatic  Eyes:  Mild conjunctival pallor, EOMs intact  Lungs:   Clear to auscultation bilaterally, respirations unlabored  Heart:  Regular rate and rhythm, S1, S2 normal  Abdomen:   Somewhat softer than yesterday but still moderately distended with some tympany upon percussion, moderate diffuse tenderness to palpation, improved bowel sounds.  Extremities: Extremities normal, atraumatic, no  edema  Pulses: 2+ and symmetric    Lab Results: Recent Labs    01/24/20 0101 01/24/20 0101 01/24/20 1952 01/25/20 0454  NA 134*   < > 135 136  K 3.1*   < > 4.4 4.3  CL 102   < > 102 103  CO2 22   < > 21* 21*  GLUCOSE 150*   < > 107* 119*  BUN 10   < > 10 13  CREATININE 0.80   < > 0.77 0.74  CALCIUM 8.1*   < > 8.1* 8.2*  MG 1.8  --   --   --    < > = values in this interval not displayed.   Recent Labs    01/24/20 0101 01/25/20 0454  AST 67* 36  ALT 27 20  ALKPHOS 86 71  BILITOT 1.0 0.7  PROT 6.5 5.7*  ALBUMIN 2.7* 2.2*   Recent Labs    01/22/20 1201 01/23/20 0152 01/24/20 0101 01/25/20 0454  WBC 7.4   < > 13.8* 14.4*   NEUTROABS 6.7  --  12.0*  --   HGB 10.0*   < > 9.5* 9.0*  HCT 32.3*   < > 30.4* 28.9*  MCV 91.8   < > 88.9 89.8  PLT 400   < > 299 357   < > = values in this interval not displayed.   Recent Labs    01/22/20 1201 01/23/20 0152  LABPROT 16.1* 17.0*  INR 1.4* 1.4*   Assessment/Plan: Ileus as seen on abdominal x-ray 7/12. Characterized by abdominal distention and nausea/vomiting. -Improved per 7/15 x-ray: Nasogastric tube tip and side port in stomach. No bowel dilatation or air-fluid level to suggest bowel obstruction. No evident free air. -Only 100 cc NG output since placement -Potassium within normal limits (4.3) as of 7/14 -Magnesium 1.8 yesterday  Metastatic gastric adenocarcinoma with gastric outlet obstruction s/p gastric-duodenal stent placement on 01/12/2020 -Hgb 9.0 today, 9.5 yesterday (baseline of 13.8 11/2019)  Plan: Due to low NGT out and nasal discomfort, recommend removal of NGT.  Clear liquid diet.  Maintain potassium above 4 and magnesium above 2.  Protonix 40mg  IV BID.  Recommend oncology consultation.  [facility] will follow.  Salley Slaughter PA-C 01/25/2020, 10:15 AM  Contact #  [phone] 
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
## 2020-01-25 NOTE — Progress Notes (Signed)
Palliative Care consultation received. Will await oncology evaluation and recommendations.  Lane Hacker, DO Palliative Medicine

## 2020-01-25 NOTE — Progress Notes (Signed)
Pharmacy Antibiotic Note  Drew [name] is a 74 y.o. male with metastatic gastric cancer presented to the ED on 01/22/2020 with c/o n/v/d.  He was started on broad abx cefepime and flagyl for suspected sepsis with vancomycin added on 7/12 for PNA.  Today, 01/25/2020: - day #4 abx - Tmax 103, wbc 14.4 -  scr 0.74 (crcl~87) - all cultures have been negative thus far   Plan: - continue vancomycin 1250 mg IV q12h, cefepime 2gm IV q8h, flagyl 500 mg IV q8h - f/u with plan/LOT for abx.  Pharmacy will plan on obtaining vancomycin trough level if plan is to treat for >7 days  ___________________________________  Height: 5\' 7"  (170.2 cm) Weight: 85.3 kg (188 lb) IBW/kg (Calculated) : 66.1  Temp (24hrs), Avg:100.4 F (38 C), Min:99 F (37.2 C), Max:103 F (39.4 C)  Recent Labs  Lab 01/22/20 0148 01/22/20 1201 01/22/20 1211 01/23/20 0152 01/23/20 0812 01/24/20 0101 01/24/20 0640 01/24/20 1952 01/25/20 0454  WBC  --  7.4  --  14.3*  --  13.8*  --   --  14.4*  CREATININE  --  1.05  --  0.82  --  0.80  --  0.77 0.74  LATICACIDVEN   < >  --  5.3*  --  1.1 1.2 1.1 1.5  --    < > = values in this interval not displayed.    Estimated Creatinine Clearance: 87.1 mL/min (by C-G formula based on SCr of 0.74 mg/dL).    No Known Allergies   Antimicrobials this admission: 7/11 Vancomycin x 1, resume 7/12 >>  7/11 Cefepime >> 7/11 Metronidazole >>   Microbiology results: 7/11 BCx: ngtd 7/11 UCx: NGF 7/11 MRSA PCR: neg  Thank you for allowing pharmacy to be a part of this patient's care.  Lynelle Doctor 01/25/2020 1:06 PM

## 2020-01-25 NOTE — Progress Notes (Signed)
Initial Nutrition Assessment  INTERVENTION:   -Boost Breeze po TID, each supplement provides 250 kcal and 9 grams of protein   NUTRITION DIAGNOSIS:   Increased nutrient needs related to cancer and cancer related treatments as evidenced by estimated needs.  GOAL:   Patient will meet greater than or equal to 90% of their needs  MONITOR:   PO intake, Supplement acceptance, Labs, Weight trends, Diet advancement, I & O's  REASON FOR ASSESSMENT:   Malnutrition Screening Tool    ASSESSMENT:   74 y.o. 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   Admitted for N/V and diarrhea.  Patient in room with daughter at bedside. Pt reports he hasn't had anything to eat since Sunday 7/11. Pt was mainly consuming liquids following his recent admission at [facility]. Pt tried to eat some soft corn flakes but per daughter ended up vomiting this up. Pt received supplements and was on PPN at one point during his admission at [facility]. Pt discharged tolerating PO intakes once he had a stent placed.   Per MD note, x-ray showed a possible ileus. NGT was placed yesterday, pt had minimal output so GI recommended removing it today. Pt now on clear liquids. Was sipping on cranberry juice at time of visit. Is willing to try Boost Breeze while on clears and would like Ensure once diet advanced.  Pt denies issues with swallowing or chewing.   Per weight records, pt has lost 11 lbs since 5/8 (5% wt loss x 2 months, insignificant for time frame). Pt reports he lost 20 lbs since before his previous admission.   Labs reviewed. Medications: Senokot, Lactated ringers  NUTRITION - FOCUSED PHYSICAL EXAM:    Most Recent Value  Orbital Region No depletion  Upper Arm  Region No depletion  Thoracic and Lumbar Region Unable to assess  Buccal Region No depletion  Temple Region Mild depletion  Clavicle Bone Region No depletion  Clavicle and Acromion Bone Region No depletion  Scapular Bone Region No depletion  Dorsal Hand No depletion  Patellar Region No depletion  Anterior Thigh Region No depletion  Posterior Calf Region No depletion  Edema (RD Assessment) None       Diet Order:   Diet Order            Diet clear liquid Room service appropriate? Yes; Fluid consistency: Thin  Diet effective now                 EDUCATION NEEDS:   Education needs have been addressed  Skin:  Skin Assessment: Reviewed RN Assessment  Last BM:  7/13 -type 7  Height:   Ht Readings from Last 1 Encounters:  01/22/20 5\' 7"  (1.702 m)    Weight:   Wt Readings from Last 1 Encounters:  01/22/20 85.3 kg   BMI:  Body mass index is 29.44 kg/m.  Estimated Nutritional Needs:   Kcal:  2000-2200  Protein:  95-105g  Fluid:  2L/day  Clayton Bibles, MS, RD, LDN Inpatient Clinical Dietitian Contact information available via Amion

## 2020-01-26 ENCOUNTER — Other Ambulatory Visit: Payer: Self-pay | Admitting: Oncology

## 2020-01-26 DIAGNOSIS — C169 Malignant neoplasm of stomach, unspecified: Secondary | ICD-10-CM

## 2020-01-26 DIAGNOSIS — A419 Sepsis, unspecified organism: Secondary | ICD-10-CM | POA: Diagnosis not present

## 2020-01-26 DIAGNOSIS — R6521 Severe sepsis with septic shock: Secondary | ICD-10-CM | POA: Diagnosis not present

## 2020-01-26 DIAGNOSIS — Z7189 Other specified counseling: Secondary | ICD-10-CM | POA: Insufficient documentation

## 2020-01-26 LAB — COMPREHENSIVE METABOLIC PANEL
ALT: 18 U/L (ref 0–44)
AST: 31 U/L (ref 15–41)
Albumin: 2.4 g/dL — ABNORMAL LOW (ref 3.5–5.0)
Alkaline Phosphatase: 68 U/L (ref 38–126)
Anion gap: 8 (ref 5–15)
BUN: 13 mg/dL (ref 8–23)
CO2: 24 mmol/L (ref 22–32)
Calcium: 8.2 mg/dL — ABNORMAL LOW (ref 8.9–10.3)
Chloride: 102 mmol/L (ref 98–111)
Creatinine, Ser: 0.74 mg/dL (ref 0.61–1.24)
GFR calc Af Amer: >60 mL/min (ref >60)
GFR calc non Af Amer: >60 mL/min (ref >60)
Glucose, Bld: 125 mg/dL — ABNORMAL HIGH (ref 70–99)
Potassium: 3.8 mmol/L (ref 3.5–5.1)
Sodium: 134 mmol/L — ABNORMAL LOW (ref 135–145)
Total Bilirubin: 0.6 mg/dL (ref 0.3–1.2)
Total Protein: 5.9 g/dL — ABNORMAL LOW (ref 6.5–8.1)

## 2020-01-26 LAB — CBC
HCT: 29.3 % — ABNORMAL LOW (ref 39.0–52.0)
Hemoglobin: 9 g/dL — ABNORMAL LOW (ref 13.0–17.0)
MCH: 28 pg (ref 26.0–34.0)
MCHC: 30.7 g/dL (ref 30.0–36.0)
MCV: 91 fL (ref 80.0–100.0)
Platelets: 334 10*3/uL (ref 150–400)
RBC: 3.22 MIL/uL — ABNORMAL LOW (ref 4.22–5.81)
RDW: 16.6 % — ABNORMAL HIGH (ref 11.5–15.5)
WBC: 12.1 10*3/uL — ABNORMAL HIGH (ref 4.0–10.5)
nRBC: 0.2 % (ref 0.0–0.2)

## 2020-01-26 MED ORDER — ENOXAPARIN SODIUM 80 MG/0.8ML ~~LOC~~ SOLN
80.0000 mg | Freq: Two times a day (BID) | SUBCUTANEOUS | Status: DC
  Administered 2020-01-26 – 2020-01-29 (×6): 80 mg via SUBCUTANEOUS
  Filled 2020-01-26 (×7): qty 0.8

## 2020-01-26 NOTE — Consult Note (Addendum)
New Hematology/Oncology Consult   Requesting VH:QIONGE,XB       Reason for Consult: Metastatic gastric cancer  HPI: Drew [name] reports developing abdominal pain several months ago.  He was seen in the emergency room Nov 19, 2019 with abdominal pain and fever.  A CT of the abdomen and pelvis revealed no acute findings. He presented to the [facility] emergency room on January 03, 2020.  He was diagnosed with gastric outlet obstruction. He was taken to an upper endoscopy on January 06, 2020.  A large ulcer was noted in the gastric antrum with heaped up and firm edges.  Multiple biopsies were obtained.  The duodenal bulb was obliterated due to the mass.  The second portion of the duodenum appeared normal.  A biopsy from the ulcer revealed adenocarcinoma, HER-2-3+, no loss of mismatch repair protein expression. He underwent placement of a palliative stent on January 12, 2020.  He had a prolonged hospital admission and was discharged home on January 20, 2020.  He had severe anemia on hospital admission and was transfused with packed red blood cells.  The stool was Hemoccult positive. A CT of the abdomen and pelvis on January 03, 2020 revealed masslike thickening of the stomach suspicious for neoplasm.  There is evidence of metastatic disease involving the peritoneum, liver, and abdominal lymph nodes.  There was a small right pleural effusion.  Small volume ascites. A chest CT on January 10, 2020 revealed acute pulmonary emboli involving lower lobe pulmonary branches bilaterally.  No chest lymphadenopathy or suspicious lung nodules. He placed on heparin followed by apixaban anticoagulation.  Drew [name] was evaluated by medical oncology while in the hospital.  A Port-A-Cath was placed and he was scheduled to begin FOLFOX/Herceptin chemotherapy as an outpatient. A brain MRI revealed evidence of ischemic strokes.  He had fever while in the hospital.  He reports persistent fever in the evening.  Drew [name] presented to the  emergency room on January 22, 2020 with acute onset nausea/vomiting and diarrhea.  He also had chills.  A CT of the abdomen/pelvis, revealed liver metastases, diffuse peritoneal carcinomatosis, ascites, a right pleural effusion, and a gastric wall stent.  He had fever and was placed on antibiotics with a suspicion of sepsis.  No source for infection has been identified.     Past Medical History:  Diagnosis Date  . ED (erectile dysfunction)   . GERD (gastroesophageal reflux disease)   . High cholesterol   . Hypertension   . Hypogonadism male   . Prediabetes   . PUD (peptic ulcer disease)   :  Past Surgical History:  Procedure Laterality Date  . FINGER AMPUTATION    . TONSILLECTOMY    :   Current Facility-Administered Medications:  .  acetaminophen (TYLENOL) suppository 650 mg, 650 mg, Rectal, Q6H PRN **OR** acetaminophen (TYLENOL) tablet 650 mg, 650 mg, Oral, Q6H PRN, Lang Snow, FNP .  atorvastatin (LIPITOR) tablet 10 mg, 10 mg, Oral, q1800, Donnamae Jude, MD .  ceFEPIme (MAXIPIME) 2 g in sodium chloride 0.9 % 100 mL IVPB, 2 g, Intravenous, Q8H, Gadhia, Jigna M, RPH, Last Rate: 200 mL/hr at 01/26/20 0543, 2 g at 01/26/20 0543 .  Chlorhexidine Gluconate Cloth 2 % PADS 6 each, 6 each, Topical, Daily, Jackelyn Knife, MD, 6 each at 01/25/20 0941 .  enoxaparin (LOVENOX) injection 85 mg, 1 mg/kg, Subcutaneous, Q12H, Donnamae Jude, MD, 85 mg at 01/25/20 2230 .  feeding supplement (BOOST / RESOURCE BREEZE) liquid 1 Container, 1 Container, Oral,  TID BM, Kc, Ramesh, MD .  HYDROcodone-acetaminophen (NORCO) 7.5-325 MG per tablet 1 tablet, 1 tablet, Oral, Q6H PRN, Antonieta Pert, MD, 1 tablet at 01/25/20 2232 .  HYDROmorphone (DILAUDID) injection 0.5-1 mg, 0.5-1 mg, Intravenous, Q2H PRN, Donnamae Jude, MD, 1 mg at 01/25/20 0941 .  lactated ringers infusion, , Intravenous, Continuous, Kc, Ramesh, MD, Last Rate: 75 mL/hr at 01/26/20 0536, New Bag at 01/26/20 0536 .  metoprolol tartrate  (LOPRESSOR) injection 5 mg, 5 mg, Intravenous, Q6H PRN, Kc, Ramesh, MD .  metoprolol tartrate (LOPRESSOR) tablet 25 mg, 25 mg, Oral, BID, Kc, Ramesh, MD, 25 mg at 01/25/20 2231 .  metroNIDAZOLE (FLAGYL) IVPB 500 mg, 500 mg, Intravenous, Q8H, Donnamae Jude, MD, Last Rate: 100 mL/hr at 01/26/20 0539, 500 mg at 01/26/20 0539 .  ondansetron (ZOFRAN) tablet 4 mg, 4 mg, Oral, Q6H PRN **OR** ondansetron (ZOFRAN) injection 4 mg, 4 mg, Intravenous, Q6H PRN, Donnamae Jude, MD, 4 mg at 01/24/20 1939 .  pantoprazole (PROTONIX) injection 40 mg, 40 mg, Intravenous, Q12H, Baron-Johnson, Alison, PA-C, 40 mg at 01/25/20 2233 .  phenol (CHLORASEPTIC) mouth spray 1 spray, 1 spray, Mouth/Throat, PRN, Buccini, Robert, MD .  sennosides (SENOKOT) 8.8 MG/5ML syrup 10 mL, 10 mL, Oral, BID, Lang Snow, FNP, 10 mL at 01/25/20 2232 .  vancomycin (VANCOREADY) IVPB 1250 mg/250 mL, 1,250 mg, Intravenous, BID, Polly Cobia, RPH, Last Rate: 166.7 mL/hr at 01/26/20 0546, 1,250 mg at 01/26/20 0546:  . atorvastatin  10 mg Oral q1800  . Chlorhexidine Gluconate Cloth  6 each Topical Daily  . enoxaparin (LOVENOX) injection  1 mg/kg Subcutaneous Q12H  . feeding supplement  1 Container Oral TID BM  . metoprolol tartrate  25 mg Oral BID  . pantoprazole (PROTONIX) IV  40 mg Intravenous Q12H  . sennosides  10 mL Oral BID  :  No Known Allergies:  FH: A sister died of breast cancer  SOCIAL HISTORY: He lives with his wife in [place].  He is a retired Company secretary.  He smokes cigarettes in the remote past.  He reports moderate alcohol use.  No transfusion history prior to the recent hospital admission.  No risk factor for HIV or hepatitis.  Review of Systems:  Positives include: Abdominal pain, intermittent nausea and vomiting, "black "stool, weight loss  A complete ROS was otherwise negative.   Physical Exam:  Blood pressure (!) 156/91, pulse 98, temperature 98.5 F (36.9 C), temperature source Oral, resp. rate 19,  height _0  (1.702 m), weight 188 lb (85.3 kg), SpO2 91 %.  HEENT: No thrush Lungs: Clear bilaterally Cardiac: Regular rate and rhythm Abdomen: Distended, no mass, nontender, no hepatomegaly  Vascular: No leg edema Lymph nodes: No cervical, supraclavicular, axillary, or inguinal nodes Neurologic: Alert and oriented, the motor exam appears intact in the upper and lower extremities bilaterally Skin: No rash Musculoskeletal: No spine tenderness  LABS:  Recent Labs    01/25/20 0454 01/26/20 0422  WBC 14.4* 12.1*  HGB 9.0* 9.0*  HCT 28.9* 29.3*  PLT 357 334    Recent Labs    01/25/20 0454 01/26/20 0422  NA 136 134*  K 4.3 3.8  CL 103 102  CO2 21* 24  GLUCOSE 119* 125*  BUN 13 13  CREATININE 0.74 0.74  CALCIUM 8.2* 8.2*      RADIOLOGY:  DG Abd 1 View  Result Date: 01/25/2020 CLINICAL DATA:  Ileus EXAM: ABDOMEN - 1 VIEW COMPARISON:  January 23, 2010 FINDINGS: Nasogastric tube tip and  side port in stomach. Stent in the right upper quadrant is unchanged in position. There is no bowel dilatation or air-fluid level to suggest bowel obstruction. No free air. Small probable phleboliths in the pelvis. IMPRESSION: Nasogastric tube tip and side port in stomach. No bowel dilatation or air-fluid level to suggest bowel obstruction. No evident free air. Electronically Signed   By: Lowella Grip III M.D.   On: 01/25/2020 08:02   CT ABDOMEN PELVIS W CONTRAST  Result Date: 01/22/2020 CLINICAL DATA:  Known gastric cancer. Nausea, vomiting and diarrhea. EXAM: CT ABDOMEN AND PELVIS WITH CONTRAST TECHNIQUE: Multidetector CT imaging of the abdomen and pelvis was performed using the standard protocol following bolus administration of intravenous contrast. CONTRAST:  141m OMNIPAQUE IOHEXOL 300 MG/ML  SOLN COMPARISON:  CT scan 11/19/2019 FINDINGS: Lower chest: Small to moderate-sized right pleural effusion is noted with overlying atelectasis. The left lung base is clear. No worrisome pulmonary  nodules to suggest pulmonary metastatic disease. The heart is normal in size. No pericardial effusion. There is a new enlarged epicardial lymph node measuring 10 mm on image 14/2 Hepatobiliary: Interval development of diffuse hepatic metastatic disease with numerous lesions throughout both lobes. Segment 2 lesion measures 16 mm. Segment 5 lesion on image 21/2 measures 20 mm. Gallbladder is unremarkable.  No common bile duct dilatation. Pancreas: No mass, inflammation or ductal dilatation. Spleen: Normal size.  No focal lesions. Adrenals/Urinary Tract: The adrenal glands and kidneys are unremarkable and stable. Small scattered renal cysts are unchanged. The bladder is unremarkable. Stomach/Bowel: There is a gastric wall stent in place. Gastric lesion appears unchanged. Perigastric adenopathy is again noted. The duodenum, small bowel and colon are grossly normal without oral contrast. No obstructive findings are demonstrated. Vascular/Lymphatic: The aorta and branch vessels are stable. Moderate scattered atherosclerotic calcifications. No aneurysm or dissection. The branch vessels are patent. The major venous structures are patent. Unfortunately, there has been interval development of diffuse peritoneal carcinomatosis with innumerable peritoneal and omental implants and associated ascites. Reproductive: The prostate gland and seminal vesicles are unremarkable. Other: Small amount of free pelvic fluid and moderate abdominal fluid. Musculoskeletal: No significant bony findings. No evidence of osseous metastatic disease. IMPRESSION: 1. Interval rapid development of diffuse hepatic metastatic disease and diffuse peritoneal carcinomatosis with innumerable peritoneal and omental implants and associated ascites. 2. Gastric wall stent in place.  Stable appearing gastric tumor. 3. Small to moderate-sized right pleural effusion with overlying atelectasis. 4. No findings for pulmonary metastatic disease. Aortic Atherosclerosis  (ICD10-I70.0). Electronically Signed   By: PMarijo SanesM.D.   On: 01/22/2020 14:48   DG Chest Port 1 View  Result Date: 01/22/2020 CLINICAL DATA:  Sepsis.  Tachycardia and tachypnea. EXAM: PORTABLE CHEST 1 VIEW COMPARISON:  September 25, 2011 FINDINGS: A new right Port-A-Cath terminates in central SVC. No pneumothorax. The left lung is clear. The cardiomediastinal silhouette is normal. Mild opacity in the medial right lung base. No other acute abnormalities. IMPRESSION: Mild opacity in the medial right lung base may represent vascular crowding, atelectasis, or early subtle infiltrate. Recommend clinical correlation and attention on follow-up. Electronically Signed   By: DDorise BullionIII M.D   On: 01/22/2020 13:09   DG Abd Portable 1V  Result Date: 01/24/2020 CLINICAL DATA:  Nasogastric tube placement. History of metastatic gastric cancer and distal gastric stenting. EXAM: PORTABLE ABDOMEN - 1 VIEW COMPARISON:  Radiographs 01/12/2020 and 01/23/2020.  CT 01/22/2020. FINDINGS: 1326 hours. Tip of the enteric tube projects over the mid stomach. Metallic gastroduodenal  stent appears unchanged in position. The bowel gas pattern is nonobstructive. No evidence of free intraperitoneal air. Small right pleural effusion and right basilar atelectasis again noted. IMPRESSION: Nasogastric tube tip projects over the mid stomach. Electronically Signed   By: Richardean Sale M.D.   On: 01/24/2020 13:39   DG Abd Portable 1V  Result Date: 01/23/2020 CLINICAL DATA:  Abdominal pain and swelling. EXAM: PORTABLE ABDOMEN - 1 VIEW COMPARISON:  Abdominal CT yesterday. FINDINGS: Gastroduodenal stent in the right upper quadrant appears in similar position to CT yesterday. Increased air within small bowel loops in the lower abdomen. Increased air throughout the ascending and transverse colon. No evidence of free air on this single view. No abnormal soft tissue calcifications. IMPRESSION: Increased air within small bowel and colon,  suggesting ileus. Gastroduodenal stent in the right upper quadrant, unchanged in position from CT yesterday. Electronically Signed   By: Keith Rake M.D.   On: 01/23/2020 23:45   DG Abd Portable 1 View  Result Date: 01/22/2020 CLINICAL DATA:  Sepsis EXAM: PORTABLE ABDOMEN - 1 VIEW COMPARISON:  CT scan Nov 19, 2019 FINDINGS: There is a stent in the right upper quadrant. There is a paucity of bowel gas limiting evaluation but no evidence of obstruction. No free air, portal venous gas, or pneumatosis. IMPRESSION: No cause for the patient's sepsis identified. Some sort of stent is present in the right upper quadrant. This may be within the distal stomach or proximal duodenum. Electronically Signed   By: Dorise Bullion III M.D   On: 01/22/2020 13:12    Assessment and Plan:   1.  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  CT abdomen/pelvis at [facility] 01/03/2020-masslike wall thickening of the gastric antrum, numerous liver metastases, small volume ascites, numerous peritoneal nodules, enlarged periaortic and porta hepatis lymph nodes  Upper endoscopy 01/06/2020-large ulcer in the gastric antrum with heaped up and firm edges,   Duodenal bulb largely "obliterated "by the mass, biopsy confirmed adenocarcinoma, HER-2  3+, intact mismatch repair protein expression  CT chest 01/10/2020-acute bilateral pulmonary emboli, no lymphadenopathy or suspicious lung nodules  CT abdomen/pelvis 01/22/2020, gastric mass, liver metastases, peritoneal/omental nodules, ascites, right pleural effusion  2.  Nausea/vomiting secondary to #1 with gastric outlet obstruction  Gastric stent placement 01/12/2020 3.  Acute bilateral lower lobe pulmonary emboli on CT 01/10/2020-treated with apixaban, now on Lovenox 4.  Anemia-likely secondary to bleeding from the gastric mass 5.  Status post Port-A-Cath placement 01/13/2020 6.  Multiple acute CVAs on brain MRI 01/11/2020-[facility], multiple small acute versus  subacute infarcts in the bilateral corona radiata, very small foci of mild enhancement in the bilateral frontal corona radiata-indeterminate, small metastases are possible 7.  Fever-no source for infection identified, possibly tumor fever  Drew [name] has been diagnosed with metastatic adenocarcinoma the stomach.  He has stage IV disease.  He understands no therapy will be curative.  I discussed treatment options with Drew [name].  His daughter was present by telephone for today's visit.  We discussed comfort/supportive care versus a trial of systemic therapy.  There is a significant chance of clinical improvement with chemotherapy in patients with metastatic gastric cancer, though patients with abdominal carcinomatosis and ascites have a poor prognosis.  Drew [name] indicates he would like to proceed with a trial of chemotherapy.  The tumor is HER-2 positive.  I agree with the recommendation from the [facility] oncology team to begin with FOLFOX/Herceptin chemotherapy. We will also consider adding pembrolizumab based on data from the  recent [employer] trial.  I reviewed potential toxicities associated with the FOLFOX regimen including the chance for nausea/vomiting, mucositis, diarrhea, alopecia, and hematologic toxicity.  We discussed the rash, hyperpigmentation, sun sensitivity, and hand/foot syndrome seen with 5-fluorouracil.  We discussed the allergic reaction and various types of neuropathy seen with oxaliplatin.  We reviewed the cardiac toxicity and allergic reaction seen with Herceptin.  He agrees to proceed.  He will attend a chemotherapy teaching class.  I recommend beginning chemotherapy as an outpatient next week  He is being treated with Lovenox anticoagulation for the bilateral pulmonary emboli.  I agree with Lovenox as opposed to apixaban with the gastric tumor in place.  He appears to have significant ascites and may benefit from a palliative therapeutic paracentesis.  Recommendations: 1.   Continue Lovenox anticoagulation, can convert to once daily dosing at discharge 2.  Palliative paracentesis with fluid to be sent for cytology 3.  Check PD-L1 score on the gastric biopsy 4.  Advance to liquid diet as recommended by GI 5.  Outpatient follow-up will be scheduled at the Cancer center for an office visit and FOLFOX/Herceptin/pembrolizumab during the week of 01/30/2020.  Betsy Coder, MD 01/26/2020, 8:24 AM

## 2020-01-26 NOTE — Plan of Care (Signed)
  Problem: Education: Goal: Knowledge of General Education information will improve Description: Including pain rating scale, medication(s)/side effects and non-pharmacologic comfort measures Outcome: Progressing   Problem: Clinical Measurements: Goal: Respiratory complications will improve Outcome: Progressing Goal: Cardiovascular complication will be avoided Outcome: Progressing   

## 2020-01-26 NOTE — Progress Notes (Signed)
The patient has been without NG suction now for about 24 hours and feels better than yesterday.  He still has his "discomfort" in the abdomen but is not vomiting.  He is on clear liquids and seems to be tolerating them reasonably well.  He reports that he had a medium size pasty dark stool (and actually showed me a photograph of it).  On the other hand, his BUN remains normal at 13, and his hemoglobin of 9.0 is unchanged from yesterday, and is just slightly lower from the day before yesterday.    Treatment for his gastric cancer is in the process of being arranged.  Recommendations:  1.  Concerning the dark stool, with him being on anticoagulation and having known intra-abdominal malignancy, it seems very possible that he has, or has had, some low-grade oozing from the GI tract but he probably does not have a correctable, identifiable point source of such blood loss.  (In other words, most likely he would be oozing from his tumor.)  I would not plan to attempt to address this endoscopically unless he had persistent bleeding necessitating frequent transfusions.  2.  I would consider advancing his diet slightly to full liquids such as Ensure supplements and, if that is tolerated, to starchy low-residue foods, such as soda crackers or pasta.  (I would avoid vegetables, the residue from which may clog the stent.) I explained that it appears that his gastroduodenal stent is functioning satisfactorily, but he could develop obstructive symptoms elsewhere in the abdomen, which would not be amenable to GI intervention.    Therefore, at this time, there appears to be a limited role for [place] and accordingly I will sign off. However, please call us if it is felt that we can be of further assistance in this patient's care.  Cleotis Nipper, M.D. Pager [phone] If no answer or after 5 PM call [phone]
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
## 2020-01-26 NOTE — Progress Notes (Signed)
START ON PATHWAY REGIMEN - Gastroesophageal     Pembrolizumab: A cycle is every 21 days:     Pembrolizumab    Chemotherapy/trastuzumab cycle 1: A cycle is 14 days:     Trastuzumab-xxxx      Oxaliplatin      Leucovorin      Fluorouracil      Fluorouracil    Chemotherapy/trastuzumab cycles 2 and beyond: A cycle is every 14 days:     Trastuzumab-xxxx      Oxaliplatin      Leucovorin      Fluorouracil      Fluorouracil   **Always confirm dose/schedule in your pharmacy ordering system**  Patient Characteristics: Distant Metastases (cM1/pM1) / Locally Recurrent Disease, Adenocarcinoma - Esophageal, [place], and Gastric, First Line, HER2 Positive Histology: Adenocarcinoma Disease Classification: Gastric Therapeutic Status: Distant Metastases (No Additional Staging) Line of Therapy: First Line HER2 Status: Positive Intent of Therapy: Non-Curative / Palliative Intent, Discussed with Patient

## 2020-01-26 NOTE — Progress Notes (Signed)
Met with patient and his daughter Magdalene River in room 1434 at [facility].  I introduced myself and explained my role as nurse navigator.  They were given my direct contact information and a folder with information on our support services at [facility].  His daughter is an attorney in [place] but is very supportive and plans to be here in [place] as much as possible while he is receiving treatment.  They both verbalized an understanding of Dr. Gearldine Shown plan to start treatment as soon as possible.  His discharge date from the hospital is undetermined at this time.

## 2020-01-27 ENCOUNTER — Inpatient Hospital Stay (HOSPITAL_COMMUNITY): Payer: Medicare PPO

## 2020-01-27 DIAGNOSIS — A419 Sepsis, unspecified organism: Secondary | ICD-10-CM | POA: Diagnosis not present

## 2020-01-27 DIAGNOSIS — C169 Malignant neoplasm of stomach, unspecified: Secondary | ICD-10-CM | POA: Diagnosis not present

## 2020-01-27 DIAGNOSIS — R6521 Severe sepsis with septic shock: Secondary | ICD-10-CM | POA: Diagnosis not present

## 2020-01-27 DIAGNOSIS — Z515 Encounter for palliative care: Secondary | ICD-10-CM | POA: Diagnosis not present

## 2020-01-27 DIAGNOSIS — Z7189 Other specified counseling: Secondary | ICD-10-CM

## 2020-01-27 DIAGNOSIS — R140 Abdominal distension (gaseous): Secondary | ICD-10-CM | POA: Diagnosis not present

## 2020-01-27 DIAGNOSIS — Z66 Do not resuscitate: Secondary | ICD-10-CM

## 2020-01-27 DIAGNOSIS — Z95828 Presence of other vascular implants and grafts: Secondary | ICD-10-CM

## 2020-01-27 LAB — CBC
HCT: 25.6 % — ABNORMAL LOW (ref 39.0–52.0)
Hemoglobin: 8 g/dL — ABNORMAL LOW (ref 13.0–17.0)
MCH: 27.8 pg (ref 26.0–34.0)
MCHC: 31.3 g/dL (ref 30.0–36.0)
MCV: 88.9 fL (ref 80.0–100.0)
Platelets: 306 10*3/uL (ref 150–400)
RBC: 2.88 MIL/uL — ABNORMAL LOW (ref 4.22–5.81)
RDW: 16.5 % — ABNORMAL HIGH (ref 11.5–15.5)
WBC: 9.2 10*3/uL (ref 4.0–10.5)
nRBC: 0 % (ref 0.0–0.2)

## 2020-01-27 LAB — BASIC METABOLIC PANEL
Anion gap: 9 (ref 5–15)
BUN: 9 mg/dL (ref 8–23)
CO2: 21 mmol/L — ABNORMAL LOW (ref 22–32)
Calcium: 7.9 mg/dL — ABNORMAL LOW (ref 8.9–10.3)
Chloride: 103 mmol/L (ref 98–111)
Creatinine, Ser: 0.74 mg/dL (ref 0.61–1.24)
GFR calc Af Amer: >60 mL/min (ref >60)
GFR calc non Af Amer: >60 mL/min (ref >60)
Glucose, Bld: 130 mg/dL — ABNORMAL HIGH (ref 70–99)
Potassium: 3.3 mmol/L — ABNORMAL LOW (ref 3.5–5.1)
Sodium: 133 mmol/L — ABNORMAL LOW (ref 135–145)

## 2020-01-27 LAB — BODY FLUID CELL COUNT WITH DIFFERENTIAL
Eos, Fluid: 0 %
Lymphs, Fluid: 53 %
Monocyte-Macrophage-Serous Fluid: 21 % — ABNORMAL LOW (ref 50–90)
Neutrophil Count, Fluid: 26 % — ABNORMAL HIGH (ref 0–25)
Total Nucleated Cell Count, Fluid: 595 cu mm (ref 0–1000)

## 2020-01-27 LAB — GLUCOSE, PLEURAL OR PERITONEAL FLUID: Glucose, Fluid: 174 mg/dL

## 2020-01-27 LAB — CULTURE, BLOOD (ROUTINE X 2)
Culture: NO GROWTH
Culture: NO GROWTH
Special Requests: ADEQUATE
Special Requests: ADEQUATE

## 2020-01-27 LAB — PROTEIN, PLEURAL OR PERITONEAL FLUID: Total protein, fluid: 3.6 g/dL

## 2020-01-27 LAB — ALBUMIN, PLEURAL OR PERITONEAL FLUID: Albumin, Fluid: 1.6 g/dL

## 2020-01-27 LAB — LACTATE DEHYDROGENASE, PLEURAL OR PERITONEAL FLUID: LD, Fluid: 186 U/L — ABNORMAL HIGH (ref 3–23)

## 2020-01-27 MED ORDER — POTASSIUM CHLORIDE CRYS ER 20 MEQ PO TBCR
40.0000 meq | EXTENDED_RELEASE_TABLET | Freq: Once | ORAL | Status: AC
  Administered 2020-01-27: 40 meq via ORAL
  Filled 2020-01-27: qty 2

## 2020-01-27 MED ORDER — LIDOCAINE HCL 1 % IJ SOLN
INTRAMUSCULAR | Status: AC
  Filled 2020-01-27: qty 20

## 2020-01-27 MED ORDER — LIDOCAINE-PRILOCAINE 2.5-2.5 % EX CREA: 1.0000 "application " | TOPICAL_CREAM | CUTANEOUS | 2 refills | Status: DC | PRN

## 2020-01-27 MED ORDER — ALBUMIN HUMAN 25 % IV SOLN
12.5000 g | Freq: Four times a day (QID) | INTRAVENOUS | Status: DC
  Administered 2020-01-27 – 2020-01-29 (×9): 12.5 g via INTRAVENOUS
  Filled 2020-01-27 (×12): qty 50

## 2020-01-27 MED ORDER — LIP MEDEX EX OINT
TOPICAL_OINTMENT | CUTANEOUS | Status: DC | PRN
  Filled 2020-01-27: qty 7

## 2020-01-27 NOTE — Progress Notes (Signed)
I coordinated all of this upcoming appointments and I spoke with patient's daughter Drew [name] regarding our plan to start chemo on Tuesday 7/20.  I reviewed all of his upcoming appointments.  She understands he must have the chemo education class on Monday 7/19 at 12:00. She has asked that this be done over the phone and they call his home number.  She understand he needs to be here on Tuesday 7/20 by 9:00 for lab/flush and see [name] NP at 9:45.  His infusion begins at 12 with the possibility they may be able to start sooner.  It will take around 6 hours.  She is aware she cannot be in the Infusion room during his treatment.    He has had his port for a couple of weeks so I am sending in EMLA cream to his pharmacy.  I explained how to use this over his port to numb prior to coming into [facility].    She verbalized an understanding of all the information given.
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
## 2020-01-27 NOTE — Procedures (Signed)
Ultrasound-guided diagnostic and therapeutic paracentesis performed yielding 2.1 liters of yellow fluid. No immediate complications.  A portion of the fluid was submitted to the lab for preordered studies. EBL none.

## 2020-01-27 NOTE — Progress Notes (Signed)
I responded to a [place] to provide Advance Directive information for the patient. I visited the patient's room with his daughter present. I provided an overview of the AD and answered their questions. The AD was completed and awaits notarization.    01/27/20 1241  Clinical Encounter Type  Visited With Patient and family together  Visit Type Spiritual support  Referral From Nurse  Consult/Referral To Chaplain  Spiritual Encounters  Spiritual Needs Literature    Chaplain Dr Redgie Grayer

## 2020-01-27 NOTE — Care Management (Signed)
    Durable Medical Equipment  (From admission, onward)         Start     Ordered   01/27/20 1227  For home use only DME Hospital bed  Once       Question Answer Comment  Length of Need Lifetime   Patient has (list medical condition): GERD, Peptic Ulcer Disease, Prediabetes   The above medical condition requires: Patient requires the ability to reposition frequently   Head must be elevated greater than: 45 degrees   Bed type Semi-electric   Support Surface: Gel Overlay      01/27/20 1227

## 2020-01-27 NOTE — Progress Notes (Signed)
IP PROGRESS NOTE  Subjective:   He denies pain and nausea.  He is tolerating a diet.  He would like to have a paracentesis.  Objective: Vital signs in last 24 hours: Blood pressure (!) 173/85, pulse 86, temperature 98.2 F (36.8 C), temperature source Oral, resp. rate 19, height _0  (1.702 m), weight 188 lb (85.3 kg), SpO2 98 %.  Intake/Output from previous day: 07/15 0701 - 07/16 0700 In: 5642.8 [P.O.:610; I.V.:1810; IV Piggyback:3222.8] Out: 825 [Urine:825]  Physical Exam:  Abdomen: Distended with ascites   Portacath/PICC-without erythema  Lab Results: Recent Labs    01/26/20 0422 01/27/20 0429  WBC 12.1* 9.2  HGB 9.0* 8.0*  HCT 29.3* 25.6*  PLT 334 306    BMET Recent Labs    01/26/20 0422 01/27/20 0429  NA 134* 133*  K 3.8 3.3*  CL 102 103  CO2 24 21*  GLUCOSE 125* 130*  BUN 13 9  CREATININE 0.74 0.74  CALCIUM 8.2* 7.9*     Medications: I have reviewed the patient's current medications.  Assessment/Plan:   1.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  CT abdomen/pelvis at [facility] 01/03/2020-masslike wall thickening of the gastric antrum, numerous liver metastases, small volume ascites, numerous peritoneal nodules, enlarged periaortic and porta hepatis lymph nodes  Upper endoscopy 01/06/2020-large ulcer in the gastric antrum with heaped up and firm edges,   Duodenal bulb largely "obliterated "by the mass, biopsy confirmed adenocarcinoma, HER-2  3+, intact mismatch repair protein expression  CT chest 01/10/2020-acute bilateral pulmonary emboli, no lymphadenopathy or suspicious lung nodules  CT abdomen/pelvis 01/22/2020, gastric mass, liver metastases, peritoneal/omental nodules, ascites, right pleural effusion  2.  Nausea/vomiting secondary to #1 with gastric outlet obstruction  Gastric stent placement 01/12/2020 3.  Acute bilateral lower lobe pulmonary emboli on CT 01/10/2020-treated with apixaban, now on Lovenox 4.   Anemia-likely secondary to bleeding from the gastric mass 5.  Status post Port-A-Cath placement 01/13/2020 6.  Multiple acute CVAs on brain MRI 01/11/2020-[facility], multiple small acute versus subacute infarcts in the bilateral corona radiata, very small foci of mild enhancement in the bilateral frontal corona radiata-indeterminate, small metastases are possible 7.  Fever-no source for infection identified, possibly tumor fever  Drew [name] appears unchanged.  He had a low-grade fever last night.  Cultures remain negative.  I suspect he has tumor fever, though he remains at high risk for infection.  The hemoglobin is lower today, but there has been no report of gross bleeding.  The plan is to initiate outpatient chemotherapy next week.  We will add pembrolizumab to the FOLFOX/Herceptin regimen.  We reviewed potential toxicities associated with pembrolizumab including the chance of a rash, diarrhea, and various autoimmune toxicities.  He agrees to proceed.  He will be scheduled for a chemotherapy teaching class.  I discussed the case with his daughter by telephone. Recommendations: 1.  Palliative paracentesis today 2.  Continue twice daily Lovenox, hold for gross bleeding 3.  Outpatient follow-up is scheduled at the Cancer center for an office visit and chemotherapy on 01/31/2020 4.  Please call oncology as needed over the weekend    LOS: 5 days   Drew Coder, MD   01/27/2020, 2:16 PM

## 2020-01-27 NOTE — Consult Note (Addendum)
Consultation Note Date: 01/27/2020   Patient Name: Drew [name]  DOB: 06/25/1946  MRN: [id number]  Age / Sex: 74 y.o., male  PCP: Gaynelle Arabian, MD Referring Physician: Antonieta Pert, MD  [name] for Consultation: Establishing goals of care  HPI/Patient Profile: 74 y.o. male  with past medical history of hypertension, hyperlipidemia, peptic ulcer disease, GERD, and recent diagnosis in June 2021 of metastatic gastric ademocarcinoma admitted on 01/22/2020 with sepsis, anemia, pleural effusion, and pulmonary embolism.   Drew [name] presented to [facility] ED on January 03, 2020 and was diagnosed with gastric outlet obstruction. A CT of the abdomen and pelvis was performed on January 03, 2020 showed evidence of metastatic disease involving the peritoneum, liver, and abdominal lymph nodes; also a small right pleural effusion and small volume of ascites. He had upper endoscopy on January 06, 2020 where ulcer biopsies were obtained - biopsy revealed adenocarcinoma. Palliative stent was placed January 12, 2020. He had a prolonged hospital stay at [facility] of 19 days, he was discharged January 20, 2020.  Drew [name] presented to [facility] ED on January 22, 2020 - CT of abdomen and pelvis revealed liver metastasis, peritoneal carcinomatosis, ascites, and right plural effusion.  Patient and family face treatment option decisions, advanced directive decisions, and anticipatory care needs.   Clinical Assessment and Goals of Care: I have reviewed medical records including EPIC notes, labs and imaging.  Went to visit patient at bedside - no family present. Drew [name] was sitting in the chair awake, alert, oriented, and able to participate in conversation.   I introduced Palliative Medicine as specialized medical care for people living with serious illness. It focuses on providing relief from the symptoms and stress of a serious illness. The goal is to  improve quality of life for both the patient and the family.  Drew [name] wanted to include his daughter/Drew [name] in the conversation today. Met with patient at bedside and Drew [name] via phone to discuss diagnosis, prognosis, GOC, EOL wishes, disposition, and options.  We discussed patient's current illness and what it means in the larger context of patient's on-going co-morbidities. I attempted to elicit values and goals of care important to the patient and family. Drew [name] goal is to go home and start outpatient chemotherapy with the understanding that it is not curative; his daughter states they are hopeful it will shrink the tumors. Discussed chemotherapy side effects and how we can try to help him feel as best he can now as he heads into treatments. Drew [name] states his biggest concern is his feeling of swelling and bloating, which is caused by his ascites. Discussed with patient and Drew [name] possibly performing a paracentesis for comfort and how we can also perform cultures of the fluid to assess for infection in light of his fevers - he was agreeable. In regards to his swelling, his albumin was noted to be low and education was provided on albumin and relation to edema - he was agreeable to receive albumin.   Advance directives, concepts specific to  code status, and rehospitalization were considered and discussed. Drew [name] was interested in completing a living will and HCPOA while he is in house. Medical recommendation was given for DNR/DNI with education provided - patient was agreeable to DNR/DNI. Patient and family both expressed they wanted to try and prevent rehospitalization - discussed options to try and meet this goal. Outpatient Palliative Care services were offered as well as referrals from [facility] for private duty in home care providers - patient and family were agreeable to both. Daughter would also like to speak to someone to get DME for home use.  [name] lives at home with his wife - his wife  already receives outpatient Palliative Care services so the patient and family are familiar with what support they can offer.   Drew [name] daughter/Drew [name] is very involved in his care and is supportive of his medical decisions. She lives in [place], but is currently in town to support Drew [name] and his wife with her medical needs also.  Discussed with patient/family the importance of continued conversation with the medical providers regarding overall plan of care and treatment options, ensuring decisions are within the context of the patient's values and GOCs.    Questions and concerns were addressed. The family was encouraged to call with questions or concerns.   NEXT OF KIN  Drew [name] - daughter  SUMMARY OF RECOMMENDATIONS   -Continue current medical treatment -DNR/DNI initiated -Patient's goal is to start outpatient palliative chemotherapy -Paracentesis with fluid culture ordered for assessment of infection and for comfort - concern that patient is currently on lovenox, may need to hold prior to procedure, will defer to IR -Noted albumin to be 2.4 on 7/15 and patient to have LE edema, albumin ordered -[name] consult placed for DME needs, private duty in home care provider referrals, and outpatient Palliative referral -Chaplain consult placed - patient is interested in completing living will and HCPOA -Patient states his daughter/Drew [name] is to be his primary medical decision maker -PMT will continue to follow holistically  Code Status/Advance Care Planning:  DNR   Symptom Management:   Bloating and fevers - paracentesis with fluid culture  Edema - albumin 25% solution 12.5g q6hrs  Palliative Prophylaxis:   Bowel Regimen, Frequent Pain Assessment and Oral Care  Additional Recommendations (Limitations, Scope, Preferences):  Full Scope Treatment  Psycho-social/Spiritual:   Desire for further Chaplaincy support:yes  Additional Recommendations: Caregiving  Support/Resources     Created space and opportunity for patient and family to express thoughts and feelings regarding patient's current medical situation.  Emotional support and therapeutic listening provided.  Prognosis:   < 6 months  Discharge Planning: Home with Palliative Services      Primary Diagnoses: Present on Admission: . Sepsis ([place]) . Acute blood loss anemia . Acute upper GI bleed . CVA (cerebral vascular accident) ([place]) . Gastric adenocarcinoma ([place]) . Gastric outlet obstruction . Metastasis ([place]) . Pulmonary embolism ([place]) . Severe protein-calorie malnutrition ([place]) . Pleural effusion, right   I have reviewed the medical record, interviewed the patient and family, and examined the patient. The following aspects are pertinent.  Past Medical History:  Diagnosis Date  . ED (erectile dysfunction)   . GERD (gastroesophageal reflux disease)   . High cholesterol   . Hypertension   . Hypogonadism male   . Prediabetes   . PUD (peptic ulcer disease)    Social History   Socioeconomic History  . Marital status: Married    Spouse name: Not on file  .  Number of children: Not on file  . Years of education: Not on file  . Highest education level: Not on file  Occupational History  . Not on file  Tobacco Use  . Smoking status: Former Research scientist (life sciences)  . Smokeless tobacco: Never Used  Substance and Sexual Activity  . Alcohol use: Yes    Alcohol/week: 10.0 standard drinks    Types: 10 Glasses of wine per week  . Drug use: No  . Sexual activity: Not on file  Other Topics Concern  . Not on file  Social History Narrative  . Not on file   Social Determinants of Health   Financial Resource Strain:   . Difficulty of Paying Living Expenses:   Food Insecurity:   . Worried About Charity fundraiser in the Last Year:   . Arboriculturist in the Last Year:   Transportation Needs:   . Film/video editor (Medical):   Marland Kitchen Lack of Transportation (Non-Medical):   Physical Activity:   . Days  of Exercise per Week:   . Minutes of Exercise per Session:   Stress:   . Feeling of Stress :   Social Connections:   . Frequency of Communication with Friends and Family:   . Frequency of Social Gatherings with Friends and Family:   . Attends Religious Services:   . Active Member of Clubs or Organizations:   . Attends Archivist Meetings:   Marland Kitchen Marital Status:    Family History  Problem Relation Age of Onset  . Diabetes Other   . Prostate cancer Brother        both brothers   Scheduled Meds: . atorvastatin  10 mg Oral q1800  . Chlorhexidine Gluconate Cloth  6 each Topical Daily  . enoxaparin (LOVENOX) injection  80 mg Subcutaneous Q12H  . feeding supplement  1 Container Oral TID BM  . metoprolol tartrate  25 mg Oral BID  . pantoprazole (PROTONIX) IV  40 mg Intravenous Q12H  . sennosides  10 mL Oral BID   Continuous Infusions: . albumin human    . ceFEPime (MAXIPIME) IV 2 g (01/27/20 0501)  . lactated ringers 75 mL/hr at 01/26/20 2007  . metronidazole 500 mg (01/27/20 1157)  . vancomycin 1,250 mg (01/27/20 0531)   PRN Meds:.acetaminophen **OR** acetaminophen, HYDROcodone-acetaminophen, HYDROmorphone (DILAUDID) injection, metoprolol tartrate, ondansetron **OR** ondansetron (ZOFRAN) IV, phenol Medications Prior to Admission:  Prior to Admission medications   Medication Sig Start Date End Date Taking? Authorizing Provider  atorvastatin (LIPITOR) 10 MG tablet Take 10 mg by mouth daily.   Yes [provider]  ELIQUIS 5 MG TABS tablet Take 5 mg by mouth 2 (two) times daily. 01/20/20  Yes [provider]  FEROSUL 325 (65 Fe) MG tablet Take 325 mg by mouth daily. 01/20/20  Yes [provider]  HYDROcodone-acetaminophen (NORCO/VICODIN) 5-325 MG tablet Take 1 tablet by mouth every 6 (six) hours as needed for moderate pain.  01/20/20  Yes [provider]  metoprolol succinate (TOPROL-XL) 25 MG 24 hr tablet Take 25 mg by mouth daily. 01/20/20  Yes  [provider]  Omega-3 Fatty Acids (FISH OIL) 1000 MG CAPS Take 2 capsules by mouth daily.   Yes [provider]  ondansetron (ZOFRAN) 4 MG tablet Take 4 mg by mouth every 8 (eight) hours as needed for nausea or vomiting.  01/20/20  Yes [provider]  polyethylene glycol (MIRALAX / GLYCOLAX) 17 g packet Take 17 g by mouth daily.  Yes [provider]  tamsulosin (FLOMAX) 0.4 MG CAPS capsule Take 0.4 mg by mouth daily. 12/22/19  Yes [provider]  aspirin EC 81 MG tablet Take 81 mg by mouth daily. Patient not taking: Reported on 01/22/2020    [provider]   No Known Allergies Review of Systems  Constitutional: Positive for fatigue.       Generalized edema  Gastrointestinal: Positive for abdominal distention.    Physical Exam Constitutional:      General: He is not in acute distress. Pulmonary:     Effort: No respiratory distress.  Abdominal:     General: There is distension.  Musculoskeletal:        General: Swelling present.  Neurological:     Mental Status: He is alert.  Psychiatric:        Mood and Affect: Mood normal.     Vital Signs: BP (!) 156/87 (BP Location: Right Arm)   Pulse 99   Temp 99.3 F (37.4 C) (Oral)   Resp 19   Ht 5' 7"  (1.702 m)   Wt 85.3 kg   SpO2 93%   BMI 29.44 kg/m  Pain Scale: 0-10 POSS *See Group Information*: 1-Acceptable,Awake and alert Pain Score: 0-No pain   SpO2: SpO2: 93 % O2 Device:SpO2: 93 % O2 Flow Rate: .   IO: Intake/output summary:   Intake/Output Summary (Last 24 hours) at 01/27/2020 0950 Last data filed at 01/27/2020 0558 Gross per 24 hour  Intake 5642.81 ml  Output 825 ml  Net 4817.81 ml    LBM: Last BM Date: 01/26/20 Baseline Weight: Weight: 85.3 kg Most recent weight: Weight: 85.3 kg     Palliative Assessment/Data: PPS 60%     Time In: 0930 Time Out: 1040 Time Total: 70 minutes Greater than 50%  of this time was spent counseling and coordinating  care related to the above assessment and plan.  Signed by: Lin Landsman, NP   Please contact Palliative Medicine Team phone at [phone] for questions and concerns.  For individual provider: See Shea Evans

## 2020-01-28 DIAGNOSIS — R140 Abdominal distension (gaseous): Secondary | ICD-10-CM | POA: Diagnosis not present

## 2020-01-28 DIAGNOSIS — R6521 Severe sepsis with septic shock: Secondary | ICD-10-CM | POA: Diagnosis not present

## 2020-01-28 DIAGNOSIS — Z66 Do not resuscitate: Secondary | ICD-10-CM | POA: Diagnosis not present

## 2020-01-28 DIAGNOSIS — A419 Sepsis, unspecified organism: Secondary | ICD-10-CM | POA: Diagnosis not present

## 2020-01-28 DIAGNOSIS — Z515 Encounter for palliative care: Secondary | ICD-10-CM | POA: Diagnosis not present

## 2020-01-28 LAB — BASIC METABOLIC PANEL
Anion gap: 7 (ref 5–15)
Anion gap: 9 (ref 5–15)
BUN: 6 mg/dL — ABNORMAL LOW (ref 8–23)
BUN: <5 mg/dL — ABNORMAL LOW (ref 8–23)
CO2: 25 mmol/L (ref 22–32)
CO2: 27 mmol/L (ref 22–32)
Calcium: 8.1 mg/dL — ABNORMAL LOW (ref 8.9–10.3)
Calcium: 8.4 mg/dL — ABNORMAL LOW (ref 8.9–10.3)
Chloride: 103 mmol/L (ref 98–111)
Chloride: 99 mmol/L (ref 98–111)
Creatinine, Ser: 0.66 mg/dL (ref 0.61–1.24)
Creatinine, Ser: 0.73 mg/dL (ref 0.61–1.24)
GFR calc Af Amer: >60 mL/min (ref >60)
GFR calc Af Amer: >60 mL/min (ref >60)
GFR calc non Af Amer: >60 mL/min (ref >60)
GFR calc non Af Amer: >60 mL/min (ref >60)
Glucose, Bld: 116 mg/dL — ABNORMAL HIGH (ref 70–99)
Glucose, Bld: 144 mg/dL — ABNORMAL HIGH (ref 70–99)
Potassium: 3 mmol/L — ABNORMAL LOW (ref 3.5–5.1)
Potassium: 2.9 mmol/L — ABNORMAL LOW (ref 3.5–5.1)
Sodium: 135 mmol/L (ref 135–145)
Sodium: 135 mmol/L (ref 135–145)

## 2020-01-28 LAB — HEMOGLOBIN AND HEMATOCRIT, BLOOD
HCT: 24.6 % — ABNORMAL LOW (ref 39.0–52.0)
HCT: 26.2 % — ABNORMAL LOW (ref 39.0–52.0)
HCT: 26.1 % — ABNORMAL LOW (ref 39.0–52.0)
Hemoglobin: 7.7 g/dL — ABNORMAL LOW (ref 13.0–17.0)
Hemoglobin: 8.1 g/dL — ABNORMAL LOW (ref 13.0–17.0)
Hemoglobin: 8.3 g/dL — ABNORMAL LOW (ref 13.0–17.0)

## 2020-01-28 LAB — GRAM STAIN: Gram Stain: NONE SEEN

## 2020-01-28 MED ORDER — FUROSEMIDE 10 MG/ML IJ SOLN
20.0000 mg | Freq: Once | INTRAMUSCULAR | Status: AC
  Administered 2020-01-28: 20 mg via INTRAVENOUS
  Filled 2020-01-28: qty 2

## 2020-01-28 MED ORDER — POTASSIUM CHLORIDE 10 MEQ/100ML IV SOLN
10.0000 meq | INTRAVENOUS | Status: AC
  Administered 2020-01-28 (×2): 10 meq via INTRAVENOUS
  Filled 2020-01-28: qty 100

## 2020-01-28 MED ORDER — POTASSIUM CHLORIDE CRYS ER 20 MEQ PO TBCR
40.0000 meq | EXTENDED_RELEASE_TABLET | Freq: Once | ORAL | Status: AC
  Administered 2020-01-28: 40 meq via ORAL
  Filled 2020-01-28: qty 2

## 2020-01-28 MED ORDER — POTASSIUM CHLORIDE CRYS ER 20 MEQ PO TBCR
20.0000 meq | EXTENDED_RELEASE_TABLET | Freq: Once | ORAL | Status: AC
  Administered 2020-01-29: 20 meq via ORAL
  Filled 2020-01-28: qty 1

## 2020-01-28 MED ORDER — POTASSIUM CHLORIDE 10 MEQ/100ML IV SOLN: 10.0000 meq | INTRAVENOUS | Status: DC

## 2020-01-28 MED ORDER — POTASSIUM CHLORIDE 10 MEQ/100ML IV SOLN
10.0000 meq | INTRAVENOUS | Status: AC
  Administered 2020-01-28 (×2): 10 meq via INTRAVENOUS
  Filled 2020-01-28 (×3): qty 100

## 2020-01-28 MED ORDER — BISACODYL 10 MG RE SUPP
10.0000 mg | Freq: Every day | RECTAL | Status: DC | PRN
  Administered 2020-01-28 – 2020-01-29 (×2): 10 mg via RECTAL
  Filled 2020-01-28 (×3): qty 1
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
## 2020-01-28 NOTE — Progress Notes (Signed)
Daily Progress Note   Patient Name: Drew [name]       Date: 01/28/2020 DOB: [birth year]  Age: 74 y.o. MRN#: [id number] Attending Physician: Antonieta Pert, MD Primary Care Physician: Gaynelle Arabian, MD Admit Date: 01/22/2020  Reason for Consultation/Follow-up: Establishing goals of care, Non pain symptom management, Pain control and Psychosocial/spiritual support  Subjective: Went to visit patient at bedside - no family present. He was awake, lying in bed eating breakfast, and able to participate in conversation.  Mr. Drew [name] states he still has slight discomfort in his abdomen but feels much better after the paracentesis yesterday - they were able to remove 2.1L of fluid. He denies having pain or nausea at this time.  [facility] was able to complete AD paperwork yesterday and patient was appreciative - patient is waiting for notary to finalize.  Mr. Drew [name] is states he is ready to go home today.  Length of Stay: 6  Current Medications: Scheduled Meds:   atorvastatin  10 mg Oral q1800   Chlorhexidine Gluconate Cloth  6 each Topical Daily   enoxaparin (LOVENOX) injection  80 mg Subcutaneous Q12H   feeding supplement  1 Container Oral TID BM   metoprolol tartrate  25 mg Oral BID   pantoprazole (PROTONIX) IV  40 mg Intravenous Q12H   sennosides  10 mL Oral BID    Continuous Infusions:  albumin human 12.5 g (01/28/20 0355)   ceFEPime (MAXIPIME) IV 2 g (01/28/20 0707)   metronidazole 500 mg (01/28/20 0539)    PRN Meds: acetaminophen **OR** acetaminophen, HYDROcodone-acetaminophen, HYDROmorphone (DILAUDID) injection, lip balm, metoprolol tartrate, ondansetron **OR** ondansetron (ZOFRAN) IV, phenol  Physical Exam Vitals and nursing note reviewed.  Constitutional:      General:  He is in acute distress.  Pulmonary:     Effort: No respiratory distress.  Abdominal:     General: There is distension.  Musculoskeletal:     Right lower leg: Edema present.     Left lower leg: Edema present.  Skin:    General: Skin is warm and dry.  Neurological:     Mental Status: He is alert.  Psychiatric:        Attention and Perception: Attention normal.        Behavior: Behavior is cooperative.        Cognition and Memory: Cognition  normal.             Vital Signs: BP (!) 150/76 (BP Location: Right Arm)    Pulse 96    Temp 98.3 F (36.8 C) (Oral)    Resp 19    Ht 5\' 7"  (1.702 m)    Wt 85.3 kg    SpO2 91%    BMI 29.44 kg/m  SpO2: SpO2: 91 % O2 Device: O2 Device: Room Air O2 Flow Rate:    Intake/output summary:   Intake/Output Summary (Last 24 hours) at 01/28/2020 0845 Last data filed at 01/28/2020 4076 Gross per 24 hour  Intake 566.29 ml  Output 475 ml  Net 91.29 ml   LBM: Last BM Date: 01/26/20 Baseline Weight: Weight: 85.3 kg Most recent weight: Weight: 85.3 kg       Palliative Assessment/Data: PPS 60%      Patient Active Problem List   Diagnosis Date Noted   Abdominal distention    Palliative care by specialist    DNR (do not resuscitate)    DNR (do not resuscitate) discussion    Advanced care planning/counseling discussion    Goals of care, counseling/discussion 01/26/2020   Sepsis ([place]) 01/22/2020   CVA (cerebral vascular accident) ([place]) 01/20/2020   Gastric outlet obstruction 01/20/2020   Pulmonary embolism ([place]) 01/20/2020   Severe protein-calorie malnutrition ([place]) 01/20/2020   Gastric adenocarcinoma ([place]) 01/17/2020   Iron deficiency 01/06/2020   Acute blood loss anemia 01/03/2020   Acute upper GI bleed 01/03/2020   Metastasis ([place]) 01/03/2020   Pleural effusion, right 01/03/2020    Palliative Care Assessment & Plan   Patient Profile: 74 y.o. male  with past medical history of hypertension, hyperlipidemia, peptic ulcer  disease, GERD, and recent diagnosis in June 2021 of metastatic gastric ademocarcinoma admitted on 01/22/2020 with sepsis, anemia, pleural effusion, and pulmonary embolism.   Mr. Drew [name] presented to [facility] ED on January 03, 2020 and was diagnosed with gastric outlet obstruction. A CT of the abdomen and pelvis was performed on January 03, 2020 showed evidence of metastatic disease involving the peritoneum, liver, and abdominal lymph nodes; also a small right pleural effusion and small volume of ascites. He had upper endoscopy on January 06, 2020 where ulcer biopsies were obtained - biopsy revealed adenocarcinoma. Palliative stent was placed January 12, 2020. He had a prolonged hospital stay at [facility] of 19 days, he was discharged January 20, 2020.  Mr. Drew [name] presented to [facility] ED on January 22, 2020 - CT of abdomen and pelvis revealed liver metastasis, peritoneal carcinomatosis, ascites, and right plural effusion.  Paracentesis performed 01/27/20.  Assessment: Sepsis with unclear etiology Pulmonary embolism Gastric outlet obstruction Gastric adenocarcinoma Abdominal carcinomatosis Acute on chronic anemia Pleural effusion  Recommendations/Plan: Continue current medical treatment Continue DNR/DNI Patient is ready for discharge when medically stable. He will start outpatient chemotherapy next week with outpatient Palliative Care to follow - [name] previously consulted Yellow DNR form completed - electronic copy placed in Media tab of EMR and hard copy placed in shadow chart PMT will continue to follow holistically  Goals of Care and Additional Recommendations: Limitations on Scope of Treatment: Full Scope Treatment  Code Status:    Code Status Orders  (From admission, onward)         Start     Ordered   01/27/20 0937  Do not attempt resuscitation (DNR)  Continuous       Question Answer Comment  In the event of cardiac or respiratory ARREST Do not  call a code blue   In the event of cardiac or  respiratory ARREST Do not perform Intubation, CPR, defibrillation or ACLS   In the event of cardiac or respiratory ARREST Use medication by any route, position, wound care, and other measures to relive pain and suffering. May use oxygen, suction and manual treatment of airway obstruction as needed for comfort.      01/27/20 0936        Code Status History    Date Active Date Inactive Code Status Order ID Comments User Context   01/25/2020 1147 01/27/2020 0936 Full Code [id number]  Antonieta Pert, MD Inpatient   01/24/2020 0947 01/25/2020 1147 DNR [id number]  Antonieta Pert, MD Inpatient   01/22/2020 1834 01/24/2020 0946 Full Code [id number]  Donnamae Jude, MD Inpatient   Advance Care Planning Activity      Prognosis:  < 6 months  Discharge Planning: Home with [place] was discussed with primary RN, patient  Thank you for allowing the Palliative Medicine Team to assist in the care of this patient.   Total Time 25 minutes Prolonged Time Billed  no       Greater than 50%  of this time was spent counseling and coordinating care related to the above assessment and plan.  Lin Landsman, NP  Please contact Palliative Medicine Team phone at [phone] for questions and concerns.

## 2020-01-28 NOTE — Progress Notes (Signed)
Pharmacy Antibiotic Note  Drew [name] is a 74 y.o. male with metastatic gastric cancer presented to the ED on 01/22/2020 with c/o n/v/d.  He was started on broad abx cefepime and flagyl for suspected sepsis with vancomycin added on 7/12 for PNA.  Today, 01/28/2020: - Day 7 Cefepime/Flagyl - Tmax 100.1 - WBC 7.7 stable -  scr 0.66 stable (crcl~87) - all cultures have been negative thus far   Plan: Continue Cefepime 2g IV q8 Flagyl per Md What is plan for LOT for abx - ? Discontinue after today to complete 7 days  ___________________________________  Height: 5\' 7"  (170.2 cm) Weight: 85.3 kg (188 lb) IBW/kg (Calculated) : 66.1  Temp (24hrs), Avg:98.8 F (37.1 C), Min:98.2 F (36.8 C), Max:100.1 F (37.8 C)  Recent Labs  Lab 01/22/20 1201 01/22/20 1211 01/23/20 0152 01/23/20 0152 01/23/20 0812 01/24/20 0101 01/24/20 0101 01/24/20 0640 01/24/20 1952 01/25/20 0454 01/26/20 0422 01/27/20 0429 01/28/20 0547  WBC   < >  --  14.3*  --   --  13.8*  --   --   --  14.4* 12.1* 9.2  --   CREATININE   < >  --  0.82   < >  --  0.80   < >  --  0.77 0.74 0.74 0.74 0.66  LATICACIDVEN  --  5.3*  --   --  1.1 1.2  --  1.1 1.5  --   --   --   --    < > = values in this interval not displayed.    Estimated Creatinine Clearance: 87.1 mL/min (by C-G formula based on SCr of 0.66 mg/dL).    No Known Allergies   Antimicrobials this admission: 7/11 Vancomycin x 1, resume 7/12 >> 7/16 7/11 Cefepime >> 7/11 Metronidazole >>   Microbiology results: 7/11 BCx: ngtd 7/11 UCx: NGF 7/11 MRSA PCR: neg 7/16 body fluid cx: sent  Thank you for allowing pharmacy to be a part of this patient's care.  Kara Mead 01/28/2020 8:55 AM

## 2020-01-28 NOTE — Progress Notes (Signed)
DNR

## 2020-01-29 DIAGNOSIS — R6520 Severe sepsis without septic shock: Secondary | ICD-10-CM

## 2020-01-29 DIAGNOSIS — A419 Sepsis, unspecified organism: Secondary | ICD-10-CM | POA: Diagnosis not present

## 2020-01-29 LAB — BASIC METABOLIC PANEL
Anion gap: 9 (ref 5–15)
BUN: 6 mg/dL — ABNORMAL LOW (ref 8–23)
CO2: 26 mmol/L (ref 22–32)
Calcium: 8.4 mg/dL — ABNORMAL LOW (ref 8.9–10.3)
Chloride: 102 mmol/L (ref 98–111)
Creatinine, Ser: 0.75 mg/dL (ref 0.61–1.24)
GFR calc Af Amer: >60 mL/min (ref >60)
GFR calc non Af Amer: >60 mL/min (ref >60)
Glucose, Bld: 143 mg/dL — ABNORMAL HIGH (ref 70–99)
Potassium: 3.3 mmol/L — ABNORMAL LOW (ref 3.5–5.1)
Sodium: 137 mmol/L (ref 135–145)

## 2020-01-29 LAB — PH, BODY FLUID: pH, Body Fluid: 7.6

## 2020-01-29 LAB — HEMOGLOBIN AND HEMATOCRIT, BLOOD
HCT: 25.6 % — ABNORMAL LOW (ref 39.0–52.0)
Hemoglobin: 8 g/dL — ABNORMAL LOW (ref 13.0–17.0)

## 2020-01-29 LAB — TRIGLYCERIDES, BODY FLUIDS: Triglycerides, Fluid: 21 mg/dL

## 2020-01-29 MED ORDER — POTASSIUM CHLORIDE CRYS ER 20 MEQ PO TBCR
40.0000 meq | EXTENDED_RELEASE_TABLET | Freq: Once | ORAL | Status: AC
  Administered 2020-01-29: 40 meq via ORAL
  Filled 2020-01-29: qty 2

## 2020-01-29 MED ORDER — BISACODYL 10 MG RE SUPP: 10.0000 mg | RECTAL | 0 refills | Status: DC | PRN

## 2020-01-29 MED ORDER — POLYETHYLENE GLYCOL 3350 17 G PO PACK: 17.0000 g | PACK | Freq: Every day | ORAL | 0 refills | Status: DC | PRN

## 2020-01-29 MED ORDER — FUROSEMIDE 20 MG PO TABS
20.0000 mg | ORAL_TABLET | Freq: Every day | ORAL | 11 refills | Status: DC
Start: 2020-01-29 — End: 2020-01-31

## 2020-01-29 MED ORDER — POTASSIUM CHLORIDE CRYS ER 20 MEQ PO TBCR: 20.0000 meq | EXTENDED_RELEASE_TABLET | Freq: Every day | ORAL | 0 refills | Status: DC

## 2020-01-29 MED ORDER — POTASSIUM CHLORIDE CRYS ER 20 MEQ PO TBCR
20.0000 meq | EXTENDED_RELEASE_TABLET | Freq: Two times a day (BID) | ORAL | Status: DC
  Administered 2020-01-29: 20 meq via ORAL
  Filled 2020-01-29: qty 1

## 2020-01-29 MED ORDER — SENNOSIDES 8.8 MG/5ML PO SYRP: 10.0000 mL | ORAL_SOLUTION | Freq: Two times a day (BID) | ORAL | 0 refills | Status: DC

## 2020-01-29 MED ORDER — HEPARIN SOD (PORK) LOCK FLUSH 100 UNIT/ML IV SOLN
500.0000 [IU] | INTRAVENOUS | Status: AC | PRN
  Administered 2020-01-29: 500 [IU]
  Filled 2020-01-29: qty 5

## 2020-01-29 MED ORDER — PANTOPRAZOLE SODIUM 40 MG PO TBEC: 40.0000 mg | DELAYED_RELEASE_TABLET | Freq: Two times a day (BID) | ORAL | 11 refills | Status: DC

## 2020-01-29 MED ORDER — AMOXICILLIN-POT CLAVULANATE 875-125 MG PO TABS
1.0000 | ORAL_TABLET | Freq: Two times a day (BID) | ORAL | 0 refills | Status: AC
Start: 2020-01-29 — End: 2020-02-01

## 2020-01-29 NOTE — TOC Progression Note (Signed)
Transition of Care [facility]) - Progression Note    Patient Details  Name: Drew [name] MRN: [id number] Date of Birth: 1945-12-18  [facility]) CM/SW Contact  Joaquin Courts, RN Phone Number: 01/29/2020, 2:52 PM  Clinical Narrative:    CM confirmed patient set up for hospital bed delivery with [employer].  [facility] to provide HHPT and RN services.         Expected Discharge Plan and Services           Expected Discharge Date: 01/29/20                         HH Arranged: PT, RN Fernando Salinas Agency: [facility] [facility] [employer] Date [place]: 01/29/20 Time [place]: 1452 Representative spoke with at [place]: [place] ([place]) Interventions    Readmission Risk Interventions No flowsheet data found.

## 2020-01-29 NOTE — Progress Notes (Signed)
AVS given to patient and explained at the bedside. Medications and follow up appointments have been explained with pt verbalizing understanding.  

## 2020-01-29 NOTE — Evaluation (Signed)
Physical Therapy Evaluation Patient Details Name: Drew [name] MRN: [id number] DOB: January 28, 1946 Today's Date: 01/29/2020   History of Present Illness  Pt admitted with abdominal pain and upper GI obstruction and with hx of Adenocarcinoma with mets, and recent PEs  Clinical Impression  Pt admitted as above and presenting with mild balance, strength and endurance deficits associated with week long hospital stay.  Pt able to perform transfers and mobilize without assist including stepping backward and sideways with min difficulty and no LOB.  Pt with mild SOB after ambulating in hall but with quick recovery.  Pt states he is near his PLOF.  No further PT needs identified and PT service will sign off.    Follow Up Recommendations No PT follow up    Equipment Recommendations  None recommended by PT    Recommendations for Other Services       Precautions / Restrictions Restrictions Weight Bearing Restrictions: No      Mobility  Bed Mobility               General bed mobility comments: Pt up in chair and requests back to same  Transfers Overall transfer level: Needs assistance Equipment used: None Transfers: Sit to/from Stand Sit to Stand: Modified independent (Device/Increase time)         General transfer comment: use of UEs to self assist  Ambulation/Gait Ambulation/Gait assistance: Independent Gait Distance (Feet): 450 Feet Assistive device: None Gait Pattern/deviations: Step-through pattern;Shuffle;Wide base of support Gait velocity: mod pace   General Gait Details: mild instability compensated with widened BOS and no overt LOB noted  Stairs            Wheelchair Mobility    Modified Rankin (Stroke Patients Only)       Balance Overall balance assessment: Needs assistance;Independent Sitting-balance support: No upper extremity supported;Feet supported Sitting balance-Leahy Scale: Normal     Standing balance support: No upper extremity  supported Standing balance-Leahy Scale: Good                               Pertinent Vitals/Pain Pain Assessment: No/denies pain    Home Living Family/patient expects to be discharged to:: Private residence Living Arrangements: Spouse/significant other Available Help at Discharge: Family Type of Home: House Home Access: Level entry     Home Layout: One level;Able to live on main level with bedroom/bathroom Home Equipment: Gilford Rile - 2 wheels;Cane - single point Additional Comments: Spouse is limited by COPD    Prior Function Level of Independence: Independent         Comments: Pt states dtr does most house work and meals are delivered by various agencies     Hand Dominance        Extremity/Trunk Assessment   Upper Extremity Assessment Upper Extremity Assessment: Overall WFL for tasks assessed    Lower Extremity Assessment Lower Extremity Assessment: Overall WFL for tasks assessed       Communication   Communication: No difficulties  Cognition Arousal/Alertness: Awake/alert Behavior During Therapy: WFL for tasks assessed/performed Overall Cognitive Status: Within Functional Limits for tasks assessed                                        General Comments      Exercises     Assessment/Plan    PT Assessment Patent does not need  any further PT services  PT Problem List         PT Treatment Interventions      PT Goals (Current goals can be found in the Care Plan section)  Acute Rehab PT Goals Patient Stated Goal: HOME PT Goal Formulation: All assessment and education complete, DC therapy    Frequency     Barriers to discharge        Co-evaluation               AM-PAC PT "6 Clicks" Mobility  Outcome Measure Help needed turning from your back to your side while in a flat bed without using bedrails?: None Help needed moving from lying on your back to sitting on the side of a flat bed without using bedrails?:  None Help needed moving to and from a bed to a chair (including a wheelchair)?: None Help needed standing up from a chair using your arms (e.g., wheelchair or bedside chair)?: None Help needed to walk in hospital room?: None Help needed climbing 3-5 steps with a railing? : None 6 Click Score: 24    End of Session Equipment Utilized During Treatment: Gait belt Activity Tolerance: Patient tolerated treatment well Patient left: in chair;with call bell/phone within reach Nurse Communication: Mobility status PT Visit Diagnosis: Difficulty in walking, not elsewhere classified (R26.2)    Time: 3403-7096 PT Time Calculation (min) (ACUTE ONLY): 14 min   Charges:   PT Evaluation $PT Eval Low Complexity: 1 Low          [place] Pager [phone] Office [phone]   Darrill Vreeland 01/29/2020, 4:07 PM

## 2020-01-29 NOTE — Discharge Summary (Signed)
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 01/24/2020 13:39   DG Abd Portable 1V  Result Date: 01/23/2020 CLINICAL DATA:  Abdominal pain and swelling. EXAM: PORTABLE ABDOMEN - 1 VIEW COMPARISON:  Abdominal CT yesterday. FINDINGS: Gastroduodenal stent in the right upper quadrant appears in similar position to CT yesterday. Increased air within small bowel loops in the lower abdomen. Increased air throughout the ascending and transverse colon. No evidence of free air on this single view. No abnormal soft tissue calcifications. IMPRESSION: Increased air within small bowel and colon, suggesting ileus. Gastroduodenal stent in the right upper quadrant, unchanged in  position from CT yesterday. Electronically Signed   By: Keith Rake M.D.   On: 01/23/2020 23:45   DG Abd Portable 1 View  Result Date: 01/22/2020 CLINICAL DATA:  Sepsis EXAM: PORTABLE ABDOMEN - 1 VIEW COMPARISON:  CT scan Nov 19, 2019 FINDINGS: There is a stent in the right upper quadrant. There is a paucity of bowel gas limiting evaluation but no evidence of obstruction. No free air, portal venous gas, or pneumatosis. IMPRESSION: No cause for the patient's sepsis identified. Some sort of stent is present in the right upper quadrant. This may be within the distal stomach or proximal duodenum. Electronically Signed   By: Dorise Bullion III M.D   On: 01/22/2020 13:12     Labs: BNP (last 3 results) No results for input(s): BNP in the last 8760 hours. Basic Metabolic Panel: Recent Labs  Lab 01/24/20 0101 01/24/20 1952 01/26/20 0422 01/27/20 0429 01/28/20 0547 01/28/20 1800 01/29/20 0603  NA 134*   < > 134* 133* 135 135 137  K 3.1*   < > 3.8 3.3* 3.0* 2.9* 3.3*  CL 102   < > 102 103 103 99 102  CO2 22   < > 24 21* 25 27 26   GLUCOSE 150*   < > 125* 130* 116* 144* 143*  BUN 10   < > 13 9 6* <5* 6*  CREATININE 0.80   < > 0.74 0.74 0.66 0.73 0.75  CALCIUM 8.1*   < > 8.2* 7.9* 8.1* 8.4* 8.4*  MG 1.8  --   --   --   --   --   --    < > = values in this interval not displayed.   Liver Function Tests: Recent Labs  Lab 01/24/20 0101 01/25/20 0454 01/26/20 0422  AST 67* 36 31  ALT 27 20 18   ALKPHOS 86 71 68  BILITOT 1.0 0.7 0.6  PROT 6.5 5.7* 5.9*  ALBUMIN 2.7* 2.2* 2.4*   No results for input(s): LIPASE, AMYLASE in the last 168 hours. No results for input(s): AMMONIA in the last 168 hours. CBC: Recent Labs  Lab 01/23/20 0152 01/23/20 0152 01/24/20 0101 01/24/20 0101 01/25/20 0454 01/25/20 0454 01/26/20 0422 01/26/20 0422 01/27/20 0429 01/28/20 0547 01/28/20 1300 01/28/20 2100 01/29/20 0603  WBC 14.3*  --  13.8*  --  14.4*  --  12.1*  --  9.2  --   --   --   --    NEUTROABS  --   --  12.0*  --   --   --   --   --   --   --   --   --   --  HGB 8.9*   < > 9.5*   < > 9.0*   < > 9.0*   < > 8.0* 7.7* 8.1* 8.3* 8.0*  HCT 29.3*   < > 30.4*   < > 28.9*   < > 29.3*   < > 25.6* 24.6* 26.2* 26.1* 25.6*  MCV 93.0  --  88.9  --  89.8  --  91.0  --  88.9  --   --   --   --   PLT 304  --  299  --  357  --  334  --  306  --   --   --   --    < > = values in this interval not displayed.   Cardiac Enzymes: No results for input(s): CKTOTAL, CKMB, CKMBINDEX, TROPONINI in the last 168 hours. BNP: Invalid input(s): POCBNP CBG: No results for input(s): GLUCAP in the last 168 hours. D-Dimer No results for input(s): DDIMER in the last 72 hours. Hgb A1c No results for input(s): HGBA1C in the last 72 hours. Lipid Profile No results for input(s): CHOL, HDL, LDLCALC, TRIG, CHOLHDL, LDLDIRECT in the last 72 hours. Thyroid function studies No results for input(s): TSH, T4TOTAL, T3FREE, THYROIDAB in the last 72 hours.  Invalid input(s): FREET3 Anemia work up No results for input(s): VITAMINB12, FOLATE, FERRITIN, TIBC, IRON, RETICCTPCT in the last 72 hours. Urinalysis    Component Value Date/Time   COLORURINE YELLOW 01/22/2020 1201   APPEARANCEUR CLEAR 01/22/2020 1201   LABSPEC >1.046 (H) 01/22/2020 1201   PHURINE 6.0 01/22/2020 1201   GLUCOSEU NEGATIVE 01/22/2020 1201   HGBUR NEGATIVE 01/22/2020 1201   BILIRUBINUR NEGATIVE 01/22/2020 1201   KETONESUR NEGATIVE 01/22/2020 1201   PROTEINUR NEGATIVE 01/22/2020 1201   UROBILINOGEN 0.2 09/25/2011 2206   NITRITE NEGATIVE 01/22/2020 1201   LEUKOCYTESUR NEGATIVE 01/22/2020 1201   Sepsis Labs Invalid input(s): PROCALCITONIN,  WBC,  LACTICIDVEN Microbiology Recent Results (from the past 240 hour(s))  Blood Culture (routine x 2)     Status: None   Collection Time: 01/22/20 12:11 PM   Specimen: BLOOD LEFT HAND  Result Value Ref Range Status   Specimen Description   Final    BLOOD LEFT HAND Performed at [facility], [place] [address], [place], [place] [address]    Special Requests   Final    BOTTLES DRAWN AEROBIC AND ANAEROBIC Blood Culture adequate volume Performed at [place] [address] [place], [place] [address]    Culture   Final    NO GROWTH 5 DAYS Performed at [facility], [place] [address], [place], [place] [address]    Report Status 01/27/2020 FINAL  Final  Urine culture     Status: None   Collection Time: 01/22/20 12:11 PM   Specimen: In/Out Cath Urine  Result Value Ref Range Status   Specimen Description   Final    IN/OUT CATH URINE Performed at [place] [address] [place], [place] [address]    Special Requests   Final    NONE Performed at [facility], [place] [address], [place], [place] [address]    Culture   Final    NO GROWTH Performed at [facility], [place] [address], [place], [place] [address]    Report Status 01/23/2020 FINAL  Final  Blood Culture (routine x 2)     Status: None   Collection Time: 01/22/20 12:16 PM   Specimen: BLOOD RIGHT WRIST  Result Value Ref Range Status   Specimen Description   Final    BLOOD RIGHT WRIST Performed at [place] [address], [place], [place] [address]    Special Requests   Final    BOTTLES DRAWN AEROBIC AND ANAEROBIC Blood Culture adequate volume Performed at [place] [address], [place], [place] [address]    Culture   Final    NO GROWTH 5 DAYS Performed at [facility], [place] [address] [place], [place] [address]    Report Status 01/27/2020 FINAL  Final  SARS Coronavirus 2 by RT PCR (hospital order, performed in [facility] hospital lab) Nasopharyngeal Nasopharyngeal Swab     Status: None   Collection Time: 01/22/20 12:20 PM   Specimen: Nasopharyngeal Swab  Result Value Ref Range Status   SARS Coronavirus 2 NEGATIVE NEGATIVE Final    Comment: (NOTE) SARS-CoV-2 target nucleic acids are NOT  DETECTED.  The SARS-CoV-2 RNA is generally detectable in upper and lower respiratory specimens during the acute phase of infection. The lowest concentration of SARS-CoV-2 viral copies this assay can detect is 250 copies / mL. A negative result does not preclude SARS-CoV-2 infection and should not be used as the sole basis for treatment or other patient management decisions.  A negative result may occur with improper specimen collection / handling, submission of specimen other than nasopharyngeal swab, presence of viral mutation(s) within the areas targeted by this assay, and inadequate number of viral copies (<250 copies / mL). A negative result must be combined with clinical observations, patient history, and epidemiological information.  Fact Sheet for Patients:   StrictlyIdeas.no  Fact Sheet for Healthcare Providers: BankingDealers.co.za  This test is not yet approved or  cleared by the [place] FDA and has been authorized for detection and/or diagnosis of SARS-CoV-2 by FDA under an Emergency Use Authorization (EUA).  This EUA will remain in effect (meaning this test can be used) for the duration of the COVID-19 declaration under Section 564(b)(1) of the Act, 21 U.S.C. section 360bbb-3(b)(1), unless the authorization is terminated or revoked sooner.  Performed at [facility], [place] [address] [place], [place] [address]   MRSA PCR Screening     Status: None   Collection Time: 01/22/20  6:38 PM   Specimen: Nasal Mucosa; Nasopharyngeal  Result Value Ref Range Status   MRSA by PCR NEGATIVE NEGATIVE Final    Comment:        The GeneXpert MRSA Assay (FDA approved for NASAL specimens only), is one component of a comprehensive MRSA colonization surveillance program. It is not intended to diagnose MRSA infection nor to guide or monitor treatment for MRSA infections. Performed at [facility], [place] [address] [place], [place] [address]   Culture, body fluid-bottle     Status: None (Preliminary result)   Collection Time: 01/27/20  2:24 PM   Specimen: Fluid  Result Value Ref Range Status   Specimen Description FLUID PERITONEAL  Final   Special Requests BOTTLES DRAWN AEROBIC AND ANAEROBIC  Final   Culture   Final    NO GROWTH 2 DAYS Performed at [facility], [address] [place], [place] [address]    Report Status PENDING  Incomplete  Gram stain     Status: None   Collection Time: 01/27/20  2:24 PM   Specimen: Fluid  Result Value Ref Range Status   Specimen Description FLUID PERITONEAL  Final   Special Requests  NONE  Final   Gram Stain   Final    NO WBC SEEN NO ORGANISMS SEEN Performed at [facility], [place] [address], [place], [place] [address]    Report Status 01/28/2020 FINAL  Final     Time coordinating discharge: 35  minutes  SIGNED: Antonieta Pert, MD  [facility] 01/29/2020, 5:37 PM  If 7PM-7AM, please contact night-coverage [email or web address]

## 2020-01-30 ENCOUNTER — Inpatient Hospital Stay: Payer: Medicare PPO | Attending: Nurse Practitioner

## 2020-01-30 DIAGNOSIS — Z5111 Encounter for antineoplastic chemotherapy: Secondary | ICD-10-CM | POA: Insufficient documentation

## 2020-01-30 DIAGNOSIS — K311 Adult hypertrophic pyloric stenosis: Secondary | ICD-10-CM | POA: Insufficient documentation

## 2020-01-30 DIAGNOSIS — R141 Gas pain: Secondary | ICD-10-CM | POA: Insufficient documentation

## 2020-01-30 DIAGNOSIS — I2699 Other pulmonary embolism without acute cor pulmonale: Secondary | ICD-10-CM | POA: Diagnosis not present

## 2020-01-30 DIAGNOSIS — Z79899 Other long term (current) drug therapy: Secondary | ICD-10-CM | POA: Insufficient documentation

## 2020-01-30 DIAGNOSIS — I10 Essential (primary) hypertension: Secondary | ICD-10-CM | POA: Insufficient documentation

## 2020-01-30 DIAGNOSIS — I639 Cerebral infarction, unspecified: Secondary | ICD-10-CM | POA: Diagnosis not present

## 2020-01-30 DIAGNOSIS — R599 Enlarged lymph nodes, unspecified: Secondary | ICD-10-CM | POA: Insufficient documentation

## 2020-01-30 DIAGNOSIS — R140 Abdominal distension (gaseous): Secondary | ICD-10-CM | POA: Diagnosis not present

## 2020-01-30 DIAGNOSIS — C169 Malignant neoplasm of stomach, unspecified: Secondary | ICD-10-CM | POA: Diagnosis not present

## 2020-01-30 DIAGNOSIS — K219 Gastro-esophageal reflux disease without esophagitis: Secondary | ICD-10-CM | POA: Insufficient documentation

## 2020-01-30 DIAGNOSIS — K5900 Constipation, unspecified: Secondary | ICD-10-CM | POA: Insufficient documentation

## 2020-01-30 DIAGNOSIS — R5383 Other fatigue: Secondary | ICD-10-CM | POA: Insufficient documentation

## 2020-01-30 DIAGNOSIS — Z7901 Long term (current) use of anticoagulants: Secondary | ICD-10-CM | POA: Insufficient documentation

## 2020-01-30 DIAGNOSIS — C787 Secondary malignant neoplasm of liver and intrahepatic bile duct: Secondary | ICD-10-CM | POA: Insufficient documentation

## 2020-01-30 DIAGNOSIS — R7303 Prediabetes: Secondary | ICD-10-CM | POA: Insufficient documentation

## 2020-01-30 DIAGNOSIS — Z5112 Encounter for antineoplastic immunotherapy: Secondary | ICD-10-CM | POA: Insufficient documentation

## 2020-01-30 DIAGNOSIS — D649 Anemia, unspecified: Secondary | ICD-10-CM | POA: Insufficient documentation

## 2020-01-30 DIAGNOSIS — Z8711 Personal history of peptic ulcer disease: Secondary | ICD-10-CM | POA: Insufficient documentation

## 2020-01-30 DIAGNOSIS — E7800 Pure hypercholesterolemia, unspecified: Secondary | ICD-10-CM | POA: Insufficient documentation

## 2020-01-30 LAB — CYTOLOGY - NON PAP

## 2020-01-31 ENCOUNTER — Other Ambulatory Visit: Payer: Self-pay

## 2020-01-31 ENCOUNTER — Other Ambulatory Visit: Payer: Medicare PPO

## 2020-01-31 ENCOUNTER — Inpatient Hospital Stay (HOSPITAL_BASED_OUTPATIENT_CLINIC_OR_DEPARTMENT_OTHER): Payer: Medicare PPO | Admitting: Nurse Practitioner

## 2020-01-31 ENCOUNTER — Encounter: Payer: Self-pay | Admitting: Nurse Practitioner

## 2020-01-31 ENCOUNTER — Inpatient Hospital Stay: Payer: Medicare PPO

## 2020-01-31 ENCOUNTER — Telehealth: Payer: Self-pay | Admitting: Nurse Practitioner

## 2020-01-31 ENCOUNTER — Encounter: Payer: Self-pay | Admitting: *Deleted

## 2020-01-31 VITALS — HR 99

## 2020-01-31 VITALS — BP 144/71 | HR 108 | Temp 99.5°F | Resp 14 | Ht 67.0 in | Wt 191.7 lb

## 2020-01-31 DIAGNOSIS — C169 Malignant neoplasm of stomach, unspecified: Secondary | ICD-10-CM

## 2020-01-31 DIAGNOSIS — Z7189 Other specified counseling: Secondary | ICD-10-CM

## 2020-01-31 DIAGNOSIS — Z79899 Other long term (current) drug therapy: Secondary | ICD-10-CM | POA: Diagnosis not present

## 2020-01-31 DIAGNOSIS — R180 Malignant ascites: Secondary | ICD-10-CM | POA: Diagnosis not present

## 2020-01-31 DIAGNOSIS — Z95828 Presence of other vascular implants and grafts: Secondary | ICD-10-CM

## 2020-01-31 DIAGNOSIS — Z7901 Long term (current) use of anticoagulants: Secondary | ICD-10-CM | POA: Diagnosis not present

## 2020-01-31 DIAGNOSIS — K311 Adult hypertrophic pyloric stenosis: Secondary | ICD-10-CM | POA: Diagnosis not present

## 2020-01-31 DIAGNOSIS — C787 Secondary malignant neoplasm of liver and intrahepatic bile duct: Secondary | ICD-10-CM | POA: Diagnosis not present

## 2020-01-31 DIAGNOSIS — Z5111 Encounter for antineoplastic chemotherapy: Secondary | ICD-10-CM | POA: Diagnosis not present

## 2020-01-31 DIAGNOSIS — C186 Malignant neoplasm of descending colon: Secondary | ICD-10-CM | POA: Diagnosis not present

## 2020-01-31 DIAGNOSIS — R141 Gas pain: Secondary | ICD-10-CM | POA: Diagnosis not present

## 2020-01-31 DIAGNOSIS — R599 Enlarged lymph nodes, unspecified: Secondary | ICD-10-CM | POA: Diagnosis not present

## 2020-01-31 DIAGNOSIS — R5383 Other fatigue: Secondary | ICD-10-CM | POA: Diagnosis not present

## 2020-01-31 DIAGNOSIS — I10 Essential (primary) hypertension: Secondary | ICD-10-CM | POA: Diagnosis not present

## 2020-01-31 DIAGNOSIS — Z5112 Encounter for antineoplastic immunotherapy: Secondary | ICD-10-CM | POA: Diagnosis not present

## 2020-01-31 DIAGNOSIS — E7800 Pure hypercholesterolemia, unspecified: Secondary | ICD-10-CM | POA: Diagnosis not present

## 2020-01-31 DIAGNOSIS — K219 Gastro-esophageal reflux disease without esophagitis: Secondary | ICD-10-CM | POA: Diagnosis not present

## 2020-01-31 DIAGNOSIS — D649 Anemia, unspecified: Secondary | ICD-10-CM | POA: Diagnosis not present

## 2020-01-31 DIAGNOSIS — Z8711 Personal history of peptic ulcer disease: Secondary | ICD-10-CM | POA: Diagnosis not present

## 2020-01-31 DIAGNOSIS — K5900 Constipation, unspecified: Secondary | ICD-10-CM | POA: Diagnosis not present

## 2020-01-31 DIAGNOSIS — R7303 Prediabetes: Secondary | ICD-10-CM | POA: Diagnosis not present

## 2020-01-31 LAB — COMPREHENSIVE METABOLIC PANEL
ALT: 11 U/L (ref 0–44)
AST: 28 U/L (ref 15–41)
Albumin: 2.9 g/dL — ABNORMAL LOW (ref 3.5–5.0)
Alkaline Phosphatase: 90 U/L (ref 38–126)
Anion gap: 10 (ref 5–15)
BUN: 7 mg/dL — ABNORMAL LOW (ref 8–23)
CO2: 25 mmol/L (ref 22–32)
Calcium: 8.6 mg/dL — ABNORMAL LOW (ref 8.9–10.3)
Chloride: 103 mmol/L (ref 98–111)
Creatinine, Ser: 0.84 mg/dL (ref 0.61–1.24)
GFR calc Af Amer: >60 mL/min (ref >60)
GFR calc non Af Amer: >60 mL/min (ref >60)
Glucose, Bld: 213 mg/dL — ABNORMAL HIGH (ref 70–99)
Potassium: 3.6 mmol/L (ref 3.5–5.1)
Sodium: 138 mmol/L (ref 135–145)
Total Bilirubin: 0.5 mg/dL (ref 0.3–1.2)
Total Protein: 6.4 g/dL — ABNORMAL LOW (ref 6.5–8.1)

## 2020-01-31 LAB — CBC WITH DIFFERENTIAL/PLATELET
Abs Immature Granulocytes: 0.07 10*3/uL (ref 0.00–0.07)
Basophils Absolute: 0.1 10*3/uL (ref 0.0–0.1)
Basophils Relative: 1 %
Eosinophils Absolute: 0.1 10*3/uL (ref 0.0–0.5)
Eosinophils Relative: 1 %
HCT: 26.7 % — ABNORMAL LOW (ref 39.0–52.0)
Hemoglobin: 8.3 g/dL — ABNORMAL LOW (ref 13.0–17.0)
Immature Granulocytes: 1 %
Lymphocytes Relative: 7 %
Lymphs Abs: 0.7 10*3/uL (ref 0.7–4.0)
MCH: 27.7 pg (ref 26.0–34.0)
MCHC: 31.1 g/dL (ref 30.0–36.0)
MCV: 89 fL (ref 80.0–100.0)
Monocytes Absolute: 0.5 10*3/uL (ref 0.1–1.0)
Monocytes Relative: 6 %
Neutro Abs: 8.3 10*3/uL — ABNORMAL HIGH (ref 1.7–7.7)
Neutrophils Relative %: 84 %
Platelets: 356 10*3/uL (ref 150–400)
RBC: 3 MIL/uL — ABNORMAL LOW (ref 4.22–5.81)
RDW: 16.8 % — ABNORMAL HIGH (ref 11.5–15.5)
WBC: 9.7 10*3/uL (ref 4.0–10.5)
nRBC: 0 % (ref 0.0–0.2)

## 2020-01-31 LAB — TSH: TSH: 1.031 u[IU]/mL (ref 0.320–4.118)

## 2020-01-31 MED ORDER — TRASTUZUMAB-ANNS CHEMO 150 MG IV SOLR
6.0000 mg/kg | Freq: Once | INTRAVENOUS | Status: AC
  Administered 2020-01-31: 504 mg via INTRAVENOUS
  Filled 2020-01-31: qty 24

## 2020-01-31 MED ORDER — SODIUM CHLORIDE 0.9 % IV SOLN
2000.0000 mg/m2 | INTRAVENOUS | Status: DC
  Administered 2020-01-31: 4000 mg via INTRAVENOUS
  Filled 2020-01-31: qty 80

## 2020-01-31 MED ORDER — PROCHLORPERAZINE MALEATE 10 MG PO TABS
10.0000 mg | ORAL_TABLET | Freq: Four times a day (QID) | ORAL | 1 refills | Status: DC | PRN
Start: 2020-01-31 — End: 2020-02-22

## 2020-01-31 MED ORDER — SODIUM CHLORIDE 0.9 % IV SOLN
INTRAVENOUS | Status: DC
  Filled 2020-01-31: qty 250

## 2020-01-31 MED ORDER — SODIUM CHLORIDE 0.9% FLUSH
10.0000 mL | Freq: Once | INTRAVENOUS | Status: AC
  Administered 2020-01-31: 10 mL
  Filled 2020-01-31: qty 10

## 2020-01-31 MED ORDER — PALONOSETRON HCL INJECTION 0.25 MG/5ML
INTRAVENOUS | Status: AC
  Filled 2020-01-31: qty 5

## 2020-01-31 MED ORDER — DIPHENHYDRAMINE HCL 25 MG PO CAPS
50.0000 mg | ORAL_CAPSULE | Freq: Once | ORAL | Status: AC
  Administered 2020-01-31: 50 mg via ORAL

## 2020-01-31 MED ORDER — SODIUM CHLORIDE 0.9% FLUSH
10.0000 mL | INTRAVENOUS | Status: DC | PRN
  Filled 2020-01-31: qty 10

## 2020-01-31 MED ORDER — FLUOROURACIL CHEMO INJECTION 2.5 GM/50ML
300.0000 mg/m2 | Freq: Once | INTRAVENOUS | Status: AC
  Administered 2020-01-31: 600 mg via INTRAVENOUS
  Filled 2020-01-31: qty 12

## 2020-01-31 MED ORDER — ACETAMINOPHEN 325 MG PO TABS
650.0000 mg | ORAL_TABLET | Freq: Once | ORAL | Status: AC
  Administered 2020-01-31: 650 mg via ORAL

## 2020-01-31 MED ORDER — SODIUM CHLORIDE 0.9 % IV SOLN
200.0000 mg | Freq: Once | INTRAVENOUS | Status: AC
  Administered 2020-01-31: 200 mg via INTRAVENOUS
  Filled 2020-01-31: qty 8

## 2020-01-31 MED ORDER — OXALIPLATIN CHEMO INJECTION 100 MG/20ML
85.0000 mg/m2 | Freq: Once | INTRAVENOUS | Status: AC
  Administered 2020-01-31: 170 mg via INTRAVENOUS
  Filled 2020-01-31: qty 34

## 2020-01-31 MED ORDER — ACETAMINOPHEN 325 MG PO TABS
ORAL_TABLET | ORAL | Status: AC
  Filled 2020-01-31: qty 2

## 2020-01-31 MED ORDER — DIPHENHYDRAMINE HCL 25 MG PO CAPS
ORAL_CAPSULE | ORAL | Status: AC
  Filled 2020-01-31: qty 2

## 2020-01-31 MED ORDER — SODIUM CHLORIDE 0.9 % IV SOLN
Freq: Once | INTRAVENOUS | Status: AC
  Filled 2020-01-31: qty 250

## 2020-01-31 MED ORDER — DEXTROSE 5 % IV SOLN
Freq: Once | INTRAVENOUS | Status: AC
  Filled 2020-01-31: qty 250

## 2020-01-31 MED ORDER — SODIUM CHLORIDE 0.9 % IV SOLN
10.0000 mg | Freq: Once | INTRAVENOUS | Status: AC
  Administered 2020-01-31: 10 mg via INTRAVENOUS
  Filled 2020-01-31: qty 10

## 2020-01-31 MED ORDER — PALONOSETRON HCL INJECTION 0.25 MG/5ML
0.2500 mg | Freq: Once | INTRAVENOUS | Status: AC
  Administered 2020-01-31: 0.25 mg via INTRAVENOUS

## 2020-01-31 MED ORDER — LEUCOVORIN CALCIUM INJECTION 350 MG
300.0000 mg/m2 | Freq: Once | INTRAVENOUS | Status: AC
  Administered 2020-01-31: 604 mg via INTRAVENOUS
  Filled 2020-01-31: qty 30.2

## 2020-01-31 NOTE — Telephone Encounter (Signed)
Scheduled per los. Gave avs and calendar  

## 2020-01-31 NOTE — Progress Notes (Signed)
Referral to CSW made today at daughter's request.  Also added PD-L1 and CPS testing to CASE: [id number].  Also faxed [facility]'s pathology department to add to previous specimen collected at their facility.

## 2020-01-31 NOTE — Progress Notes (Signed)
[place] [facility]  Clinical Social Work received referral for transportation needs.  CSW contacted patients daughter by phone to offer support and assess for needs.  Patients daughter lives out of town, and would like to discuss resources for her father when she is not available.  CSW and patients daughter discussed support services at [facility] including transportation.  Patient has [facility], and could possibly have transportation benefits through [employer].  CSW provided patients daughter with [employer] contact information, and encouraged her to call the number on the back of patients insurance card to determine details of patients transportation benefit.  CSW also provided information on the transportation program at [facility].  Patients daughter requested a referral to the transportation team.  CSW completed the referral, and including patients daughters contact information.  CSW and patients daughter discussed advance directives.  Patient and family have the advance directives booklet and plan to get documents notarized.  CSW provided contact information and encouraged patient and family to call with questions or concerns.        Johnnye Lana, MSW, LCSW, OSW-C Clinical Social Worker [facility] [phone]

## 2020-01-31 NOTE — Patient Instructions (Signed)
[facility] Discharge Instructions for Patients Receiving Chemotherapy  Today you received the following chemotherapy agents: pembrolizumab, trastuzumab, oxaliplatin, leucovorin, fluorouracil.   To help prevent nausea and vomiting after your treatment, we encourage you to take your nausea medication as prescribed by your physician.    If you develop nausea and vomiting that is not controlled by your nausea medication, call the clinic.   BELOW ARE SYMPTOMS THAT SHOULD BE REPORTED IMMEDIATELY:  *FEVER GREATER THAN 100.5 F  *CHILLS WITH OR WITHOUT FEVER  NAUSEA AND VOMITING THAT IS NOT CONTROLLED WITH YOUR NAUSEA MEDICATION  *UNUSUAL SHORTNESS OF BREATH  *UNUSUAL BRUISING OR BLEEDING  TENDERNESS IN MOUTH AND THROAT WITH OR WITHOUT PRESENCE OF ULCERS  *URINARY PROBLEMS  *BOWEL PROBLEMS  UNUSUAL RASH Items with * indicate a potential emergency and should be followed up as soon as possible.  Feel free to call the clinic should you have any questions or concerns. The clinic phone number is [phone].  Please show the CHEMO ALERT CARD at check-in to the Emergency Department and triage nurse.  Pembrolizumab injection What is this medicine? PEMBROLIZUMAB (pem broe liz ue mab) is a monoclonal antibody. It is used to treat certain types of cancer. This medicine may be used for other purposes; ask your health care provider or pharmacist if you have questions. COMMON BRAND NAME(S): Keytruda What should I tell my health care provider before I take this medicine? They need to know if you have any of these conditions:  diabetes  immune system problems  inflammatory bowel disease  liver disease  lung or breathing disease  lupus  received or scheduled to receive an organ transplant or a stem-cell transplant that uses donor stem cells  an unusual or allergic reaction to pembrolizumab, other medicines, foods, dyes, or preservatives  pregnant or trying to get  pregnant  breast-feeding How should I use this medicine? This medicine is for infusion into a vein. It is given by a health care professional in a hospital or clinic setting. A special MedGuide will be given to you before each treatment. Be sure to read this information carefully each time. Talk to your pediatrician regarding the use of this medicine in children. While this drug may be prescribed for children as young as 6 months for selected conditions, precautions do apply. Overdosage: If you think you have taken too much of this medicine contact a poison control center or emergency room at once. NOTE: This medicine is only for you. Do not share this medicine with others. What if I miss a dose? It is important not to miss your dose. Call your doctor or health care professional if you are unable to keep an appointment. What may interact with this medicine? Interactions have not been studied. Give your health care provider a list of all the medicines, herbs, non-prescription drugs, or dietary supplements you use. Also tell them if you smoke, drink alcohol, or use illegal drugs. Some items may interact with your medicine. This list may not describe all possible interactions. Give your health care provider a list of all the medicines, herbs, non-prescription drugs, or dietary supplements you use. Also tell them if you smoke, drink alcohol, or use illegal drugs. Some items may interact with your medicine. What should I watch for while using this medicine? Your condition will be monitored carefully while you are receiving this medicine. You may need blood work done while you are taking this medicine. Do not become pregnant while taking this medicine   or for 4 months after stopping it. Women should inform their doctor if they wish to become pregnant or think they might be pregnant. There is a potential for serious side effects to an unborn child. Talk to your health care professional or pharmacist for  more information. Do not breast-feed an infant while taking this medicine or for 4 months after the last dose. What side effects may I notice from receiving this medicine? Side effects that you should report to your doctor or health care professional as soon as possible:  allergic reactions like skin rash, itching or hives, swelling of the face, lips, or tongue  bloody or black, tarry  breathing problems  changes in vision  chest pain  chills  confusion  constipation  cough  diarrhea  dizziness or feeling faint or lightheaded  fast or irregular heartbeat  fever  flushing  joint pain  low blood counts - this medicine may decrease the number of white blood cells, red blood cells and platelets. You may be at increased risk for infections and bleeding.  muscle pain  muscle weakness  pain, tingling, numbness in the hands or feet  persistent headache  redness, blistering, peeling or loosening of the skin, including inside the mouth  signs and symptoms of high blood sugar such as dizziness; dry mouth; dry skin; fruity breath; nausea; stomach pain; increased hunger or thirst; increased urination  signs and symptoms of kidney injury like trouble passing urine or change in the amount of urine  signs and symptoms of liver injury like dark urine, light-colored stools, loss of appetite, nausea, right upper belly pain, yellowing of the eyes or skin  sweating  swollen lymph nodes  weight loss Side effects that usually do not require medical attention (report to your doctor or health care professional if they continue or are bothersome):  decreased appetite  hair loss  muscle pain  tiredness This list may not describe all possible side effects. Call your doctor for medical advice about side effects. You may report side effects to FDA at 1-800-FDA-1088. Where should I keep my medicine? This drug is given in a hospital or clinic and will not be stored at home. NOTE:  This sheet is a summary. It may not cover all possible information. If you have questions about this medicine, talk to your doctor, pharmacist, or health care provider.  2020 Elsevier/Gold Standard (2019-05-06 18:07:58)  Trastuzumab injection for infusion What is this medicine? TRASTUZUMAB (tras TOO zoo mab) is a monoclonal antibody. It is used to treat breast cancer and stomach cancer. This medicine may be used for other purposes; ask your health care provider or pharmacist if you have questions. COMMON BRAND NAME(S): Herceptin, Herzuma, KANJINTI, Ogivri, Ontruzant, Trazimera What should I tell my health care provider before I take this medicine? They need to know if you have any of these conditions:  heart disease  heart failure  lung or breathing disease, like asthma  an unusual or allergic reaction to trastuzumab, benzyl alcohol, or other medications, foods, dyes, or preservatives  pregnant or trying to get pregnant  breast-feeding How should I use this medicine? This drug is given as an infusion into a vein. It is administered in a hospital or clinic by a specially trained health care professional. Talk to your pediatrician regarding the use of this medicine in children. This medicine is not approved for use in children. Overdosage: If you think you have taken too much of this medicine contact a poison control center or   emergency room at once. NOTE: This medicine is only for you. Do not share this medicine with others. What if I miss a dose? It is important not to miss a dose. Call your doctor or health care professional if you are unable to keep an appointment. What may interact with this medicine? This medicine may interact with the following medications:  certain types of chemotherapy, such as daunorubicin, doxorubicin, epirubicin, and idarubicin This list may not describe all possible interactions. Give your health care provider a list of all the medicines, herbs,  non-prescription drugs, or dietary supplements you use. Also tell them if you smoke, drink alcohol, or use illegal drugs. Some items may interact with your medicine. What should I watch for while using this medicine? Visit your doctor for checks on your progress. Report any side effects. Continue your course of treatment even though you feel ill unless your doctor tells you to stop. Call your doctor or health care professional for advice if you get a fever, chills or sore throat, or other symptoms of a cold or flu. Do not treat yourself. Try to avoid being around people who are sick. You may experience fever, chills and shaking during your first infusion. These effects are usually mild and can be treated with other medicines. Report any side effects during the infusion to your health care professional. Fever and chills usually do not happen with later infusions. Do not become pregnant while taking this medicine or for 7 months after stopping it. Women should inform their doctor if they wish to become pregnant or think they might be pregnant. Women of child-bearing potential will need to have a negative pregnancy test before starting this medicine. There is a potential for serious side effects to an unborn child. Talk to your health care professional or pharmacist for more information. Do not breast-feed an infant while taking this medicine or for 7 months after stopping it. Women must use effective birth control with this medicine. What side effects may I notice from receiving this medicine? Side effects that you should report to your doctor or health care professional as soon as possible:  allergic reactions like skin rash, itching or hives, swelling of the face, lips, or tongue  chest pain or palpitations  cough  dizziness  feeling faint or lightheaded, falls  fever  general ill feeling or flu-like symptoms  signs of worsening heart failure like breathing problems; swelling in your legs and  feet  unusually weak or tired Side effects that usually do not require medical attention (report to your doctor or health care professional if they continue or are bothersome):  bone pain  changes in taste  diarrhea  joint pain  nausea/vomiting  weight loss This list may not describe all possible side effects. Call your doctor for medical advice about side effects. You may report side effects to FDA at 1-800-FDA-1088. Where should I keep my medicine? This drug is given in a hospital or clinic and will not be stored at home. NOTE: This sheet is a summary. It may not cover all possible information. If you have questions about this medicine, talk to your doctor, pharmacist, or health care provider.  2020 Elsevier/Gold Standard (2016-06-24 14:37:52)  Oxaliplatin Injection What is this medicine? OXALIPLATIN (ox AL i PLA tin) is a chemotherapy drug. It targets fast dividing cells, like cancer cells, and causes these cells to die. This medicine is used to treat cancers of the colon and rectum, and many other cancers. This medicine may   be used for other purposes; ask your health care provider or pharmacist if you have questions. COMMON BRAND NAME(S): Eloxatin What should I tell my health care provider before I take this medicine? They need to know if you have any of these conditions:  heart disease  history of irregular heartbeat  liver disease  low blood counts, like white cells, platelets, or red blood cells  lung or breathing disease, like asthma  take medicines that treat or prevent blood clots  tingling of the fingers or toes, or other nerve disorder  an unusual or allergic reaction to oxaliplatin, other chemotherapy, other medicines, foods, dyes, or preservatives  pregnant or trying to get pregnant  breast-feeding How should I use this medicine? This drug is given as an infusion into a vein. It is administered in a hospital or clinic by a specially trained health care  professional. Talk to your pediatrician regarding the use of this medicine in children. Special care may be needed. Overdosage: If you think you have taken too much of this medicine contact a poison control center or emergency room at once. NOTE: This medicine is only for you. Do not share this medicine with others. What if I miss a dose? It is important not to miss a dose. Call your doctor or health care professional if you are unable to keep an appointment. What may interact with this medicine? Do not take this medicine with any of the following medications:  cisapride  dronedarone  pimozide  thioridazine This medicine may also interact with the following medications:  aspirin and aspirin-like medicines  certain medicines that treat or prevent blood clots like warfarin, apixaban, dabigatran, and rivaroxaban  cisplatin  cyclosporine  diuretics  medicines for infection like acyclovir, adefovir, amphotericin B, bacitracin, cidofovir, foscarnet, ganciclovir, gentamicin, pentamidine, vancomycin  NSAIDs, medicines for pain and inflammation, like ibuprofen or naproxen  other medicines that prolong the QT interval (an abnormal heart rhythm)  pamidronate  zoledronic acid This list may not describe all possible interactions. Give your health care provider a list of all the medicines, herbs, non-prescription drugs, or dietary supplements you use. Also tell them if you smoke, drink alcohol, or use illegal drugs. Some items may interact with your medicine. What should I watch for while using this medicine? Your condition will be monitored carefully while you are receiving this medicine. You may need blood work done while you are taking this medicine. This medicine may make you feel generally unwell. This is not uncommon as chemotherapy can affect healthy cells as well as cancer cells. Report any side effects. Continue your course of treatment even though you feel ill unless your  healthcare professional tells you to stop. This medicine can make you more sensitive to cold. Do not drink cold drinks or use ice. Cover exposed skin before coming in contact with cold temperatures or cold objects. When out in cold weather wear warm clothing and cover your mouth and nose to warm the air that goes into your lungs. Tell your doctor if you get sensitive to the cold. Do not become pregnant while taking this medicine or for 9 months after stopping it. Women should inform their health care professional if they wish to become pregnant or think they might be pregnant. Men should not father a child while taking this medicine and for 6 months after stopping it. There is potential for serious side effects to an unborn child. Talk to your health care professional for more information. Do not breast-feed a   child while taking this medicine or for 3 months after stopping it. This medicine has caused ovarian failure in some women. This medicine may make it more difficult to get pregnant. Talk to your health care professional if you are concerned about your fertility. This medicine has caused decreased sperm counts in some men. This may make it more difficult to father a child. Talk to your health care professional if you are concerned about your fertility. This medicine may increase your risk of getting an infection. Call your health care professional for advice if you get a fever, chills, or sore throat, or other symptoms of a cold or flu. Do not treat yourself. Try to avoid being around people who are sick. Avoid taking medicines that contain aspirin, acetaminophen, ibuprofen, naproxen, or ketoprofen unless instructed by your health care professional. These medicines may hide a fever. Be careful brushing or flossing your teeth or using a toothpick because you may get an infection or bleed more easily. If you have any dental work done, tell your dentist you are receiving this medicine. What side effects  may I notice from receiving this medicine? Side effects that you should report to your doctor or health care professional as soon as possible:  allergic reactions like skin rash, itching or hives, swelling of the face, lips, or tongue  breathing problems  cough  low blood counts - this medicine may decrease the number of white blood cells, red blood cells, and platelets. You may be at increased risk for infections and bleeding  nausea, vomiting  pain, redness, or irritation at site where injected  pain, tingling, numbness in the hands or feet  signs and symptoms of bleeding such as bloody or black, tarry stools; red or dark brown urine; spitting up blood or brown material that looks like coffee grounds; red spots on the skin; unusual bruising or bleeding from the eyes, gums, or nose  signs and symptoms of a dangerous change in heartbeat or heart rhythm like chest pain; dizziness; fast, irregular heartbeat; palpitations; feeling faint or lightheaded; falls  signs and symptoms of infection like fever; chills; cough; sore throat; pain or trouble passing urine  signs and symptoms of liver injury like dark yellow or brown urine; general ill feeling or flu-like symptoms; light-colored stools; loss of appetite; nausea; right upper belly pain; unusually weak or tired; yellowing of the eyes or skin  signs and symptoms of low red blood cells or anemia such as unusually weak or tired; feeling faint or lightheaded; falls  signs and symptoms of muscle injury like dark urine; trouble passing urine or change in the amount of urine; unusually weak or tired; muscle pain; back pain Side effects that usually do not require medical attention (report to your doctor or health care professional if they continue or are bothersome):  changes in taste  diarrhea  gas  hair loss  loss of appetite  mouth sores This list may not describe all possible side effects. Call your doctor for medical advice about  side effects. You may report side effects to FDA at 1-800-FDA-1088. Where should I keep my medicine? This drug is given in a hospital or clinic and will not be stored at home. NOTE: This sheet is a summary. It may not cover all possible information. If you have questions about this medicine, talk to your doctor, pharmacist, or health care provider.  2020 Elsevier/Gold Standard (2018-11-17 12:20:35)  Leucovorin injection What is this medicine? LEUCOVORIN (loo koe VOR in) is used   to prevent or treat the harmful effects of some medicines. This medicine is used to treat anemia caused by a low amount of folic acid in the body. It is also used with 5-fluorouracil (5-FU) to treat colon cancer. This medicine may be used for other purposes; ask your health care provider or pharmacist if you have questions. What should I tell my health care provider before I take this medicine? They need to know if you have any of these conditions:  anemia from low levels of vitamin B-12 in the blood  an unusual or allergic reaction to leucovorin, folic acid, other medicines, foods, dyes, or preservatives  pregnant or trying to get pregnant  breast-feeding How should I use this medicine? This medicine is for injection into a muscle or into a vein. It is given by a health care professional in a hospital or clinic setting. Talk to your pediatrician regarding the use of this medicine in children. Special care may be needed. Overdosage: If you think you have taken too much of this medicine contact a poison control center or emergency room at once. NOTE: This medicine is only for you. Do not share this medicine with others. What if I miss a dose? This does not apply. What may interact with this medicine?  capecitabine  fluorouracil  phenobarbital  phenytoin  primidone  trimethoprim-sulfamethoxazole This list may not describe all possible interactions. Give your health care provider a list of all the  medicines, herbs, non-prescription drugs, or dietary supplements you use. Also tell them if you smoke, drink alcohol, or use illegal drugs. Some items may interact with your medicine. What should I watch for while using this medicine? Your condition will be monitored carefully while you are receiving this medicine. This medicine may increase the side effects of 5-fluorouracil, 5-FU. Tell your doctor or health care professional if you have diarrhea or mouth sores that do not get better or that get worse. What side effects may I notice from receiving this medicine? Side effects that you should report to your doctor or health care professional as soon as possible:  allergic reactions like skin rash, itching or hives, swelling of the face, lips, or tongue  breathing problems  fever, infection  mouth sores  unusual bleeding or bruising  unusually weak or tired Side effects that usually do not require medical attention (report to your doctor or health care professional if they continue or are bothersome):  constipation or diarrhea  loss of appetite  nausea, vomiting This list may not describe all possible side effects. Call your doctor for medical advice about side effects. You may report side effects to FDA at 1-800-FDA-1088. Where should I keep my medicine? This drug is given in a hospital or clinic and will not be stored at home. NOTE: This sheet is a summary. It may not cover all possible information. If you have questions about this medicine, talk to your doctor, pharmacist, or health care provider.  2020 Elsevier/Gold Standard (2008-01-04 16:50:29)  Fluorouracil, 5-FU injection What is this medicine? FLUOROURACIL, 5-FU (flure oh YOOR a sil) is a chemotherapy drug. It slows the growth of cancer cells. This medicine is used to treat many types of cancer like breast cancer, colon or rectal cancer, pancreatic cancer, and stomach cancer. This medicine may be used for other purposes; ask  your health care provider or pharmacist if you have questions. COMMON BRAND NAME(S): Adrucil What should I tell my health care provider before I take this medicine? They need to   know if you have any of these conditions:  blood disorders  dihydropyrimidine dehydrogenase (DPD) deficiency  infection (especially a virus infection such as chickenpox, cold sores, or herpes)  kidney disease  liver disease  malnourished, poor nutrition  recent or ongoing radiation therapy  an unusual or allergic reaction to fluorouracil, other chemotherapy, other medicines, foods, dyes, or preservatives  pregnant or trying to get pregnant  breast-feeding How should I use this medicine? This drug is given as an infusion or injection into a vein. It is administered in a hospital or clinic by a specially trained health care professional. Talk to your pediatrician regarding the use of this medicine in children. Special care may be needed. Overdosage: If you think you have taken too much of this medicine contact a poison control center or emergency room at once. NOTE: This medicine is only for you. Do not share this medicine with others. What if I miss a dose? It is important not to miss your dose. Call your doctor or health care professional if you are unable to keep an appointment. What may interact with this medicine?  allopurinol  cimetidine  dapsone  digoxin  hydroxyurea  leucovorin  levamisole  medicines for seizures like ethotoin, fosphenytoin, phenytoin  medicines to increase blood counts like filgrastim, pegfilgrastim, sargramostim  medicines that treat or prevent blood clots like warfarin, enoxaparin, and dalteparin  methotrexate  metronidazole  pyrimethamine  some other chemotherapy drugs like busulfan, cisplatin, estramustine, vinblastine  trimethoprim  trimetrexate  vaccines Talk to your doctor or health care professional before taking any of these  medicines:  acetaminophen  aspirin  ibuprofen  ketoprofen  naproxen This list may not describe all possible interactions. Give your health care provider a list of all the medicines, herbs, non-prescription drugs, or dietary supplements you use. Also tell them if you smoke, drink alcohol, or use illegal drugs. Some items may interact with your medicine. What should I watch for while using this medicine? Visit your doctor for checks on your progress. This drug may make you feel generally unwell. This is not uncommon, as chemotherapy can affect healthy cells as well as cancer cells. Report any side effects. Continue your course of treatment even though you feel ill unless your doctor tells you to stop. In some cases, you may be given additional medicines to help with side effects. Follow all directions for their use. Call your doctor or health care professional for advice if you get a fever, chills or sore throat, or other symptoms of a cold or flu. Do not treat yourself. This drug decreases your body's ability to fight infections. Try to avoid being around people who are sick. This medicine may increase your risk to bruise or bleed. Call your doctor or health care professional if you notice any unusual bleeding. Be careful brushing and flossing your teeth or using a toothpick because you may get an infection or bleed more easily. If you have any dental work done, tell your dentist you are receiving this medicine. Avoid taking products that contain aspirin, acetaminophen, ibuprofen, naproxen, or ketoprofen unless instructed by your doctor. These medicines may hide a fever. Do not become pregnant while taking this medicine. Women should inform their doctor if they wish to become pregnant or think they might be pregnant. There is a potential for serious side effects to an unborn child. Talk to your health care professional or pharmacist for more information. Do not breast-feed an infant while taking  this medicine. Men should inform   their doctor if they wish to father a child. This medicine may lower sperm counts. Do not treat diarrhea with over the counter products. Contact your doctor if you have diarrhea that lasts more than 2 days or if it is severe and watery. This medicine can make you more sensitive to the sun. Keep out of the sun. If you cannot avoid being in the sun, wear protective clothing and use sunscreen. Do not use sun lamps or tanning beds/booths. What side effects may I notice from receiving this medicine? Side effects that you should report to your doctor or health care professional as soon as possible:  allergic reactions like skin rash, itching or hives, swelling of the face, lips, or tongue  low blood counts - this medicine may decrease the number of white blood cells, red blood cells and platelets. You may be at increased risk for infections and bleeding.  signs of infection - fever or chills, cough, sore throat, pain or difficulty passing urine  signs of decreased platelets or bleeding - bruising, pinpoint red spots on the skin, black, tarry stools, blood in the urine  signs of decreased red blood cells - unusually weak or tired, fainting spells, lightheadedness  breathing problems  changes in vision  chest pain  mouth sores  nausea and vomiting  pain, swelling, redness at site where injected  pain, tingling, numbness in the hands or feet  redness, swelling, or sores on hands or feet  stomach pain  unusual bleeding Side effects that usually do not require medical attention (report to your doctor or health care professional if they continue or are bothersome):  changes in finger or toe nails  diarrhea  dry or itchy skin  hair loss  headache  loss of appetite  sensitivity of eyes to the light  stomach upset  unusually teary eyes This list may not describe all possible side effects. Call your doctor for medical advice about side effects.  You may report side effects to FDA at 1-800-FDA-1088. Where should I keep my medicine? This drug is given in a hospital or clinic and will not be stored at home. NOTE: This sheet is a summary. It may not cover all possible information. If you have questions about this medicine, talk to your doctor, pharmacist, or health care provider.  2020 Elsevier/Gold Standard (2007-11-03 13:53:16)     

## 2020-01-31 NOTE — Progress Notes (Addendum)
Drew [name]   [phone] [phone]   Clinic Follow up Note   Patient Care Team: Gaynelle Arabian, MD as PCP - General (Family Medicine) Jonnie Finner, RN as Oncology Nurse Navigator Ladell Pier, MD as Consulting Physician (Oncology) 01/31/2020  CHIEF COMPLAINT: Metastatic gastric cancer, first outpatient follow-up  CURRENT THERAPY: Pending FOLFOX with trastuzumab (q14 days) and pembrolizumab (q21 days), starting 01/31/20  INTERVAL HISTORY: Drew [name] presents with his daughter for follow-up as scheduled.  He was discharged from the hospital on 01/29/2020.  He is doing well.  Since discharge, he has been active at home around the house, being outside, taking the stairs at least 4 times per day. He is bathing independently. Out of bed/chair more than 1/2 the day. He has mild exertional dyspnea, no cough, chest pain or recurrent fever.  He is eating small frequent meals.  Occasionally gags on his toothbrush without overt nausea/vomiting.  Abdomen feels tight again, he felt temporary relief after paracentesis on 7/16 but felt fluid reaccumulated the next day.  Dyspnea does not worsen when he lays back.  He takes 2 Tylenol at night.  He has gas pains but is passing gas and had good BM this morning.  Takes 2 stool softener daily.  Denies bleeding. His daughter is here from [place], planning to be here at least until his next treatment.    MEDICAL HISTORY:  Past Medical History:  Diagnosis Date  . ED (erectile dysfunction)   . GERD (gastroesophageal reflux disease)   . High cholesterol   . Hypertension   . Hypogonadism male   . Prediabetes   . PUD (peptic ulcer disease)     SURGICAL HISTORY: Past Surgical History:  Procedure Laterality Date  . FINGER AMPUTATION    . TONSILLECTOMY      I have reviewed the social history and family history with the patient and they are unchanged from previous note.  ALLERGIES:  has No Known  Allergies.  MEDICATIONS:  Current Outpatient Medications  Medication Sig Dispense Refill  . amoxicillin-clavulanate (AUGMENTIN) 875-125 MG tablet Take 1 tablet by mouth 2 (two) times daily for 3 days. 6 tablet 0  . atorvastatin (LIPITOR) 10 MG tablet Take 10 mg by mouth daily.    . bisacodyl (DULCOLAX) 10 MG suppository Place 1 suppository (10 mg total) rectally as needed for moderate constipation. 12 suppository 0  . ELIQUIS 5 MG TABS tablet Take 5 mg by mouth 2 (two) times daily.    . FEROSUL 325 (65 Fe) MG tablet Take 325 mg by mouth daily.    Marland Kitchen HYDROcodone-acetaminophen (NORCO/VICODIN) 5-325 MG tablet Take 1 tablet by mouth every 6 (six) hours as needed for moderate pain.     Marland Kitchen lidocaine-prilocaine (EMLA) cream Apply 1 application topically as needed. 30 g 2  . metoprolol succinate (TOPROL-XL) 25 MG 24 hr tablet Take 25 mg by mouth daily.    . Omega-3 Fatty Acids (FISH OIL) 1000 MG CAPS Take 2 capsules by mouth daily.    . ondansetron (ZOFRAN) 4 MG tablet Take 4 mg by mouth every 8 (eight) hours as needed for nausea or vomiting.     . pantoprazole (PROTONIX) 40 MG tablet Take 1 tablet (40 mg total) by mouth 2 (two) times daily. 60 tablet 11  . polyethylene glycol (MIRALAX / GLYCOLAX) 17 g packet Take 17 g by mouth daily as needed. 14 each 0  . potassium chloride SA (KLOR-CON) 20 MEQ tablet Take 1 tablet (20 mEq  total) by mouth daily for 14 doses. 14 tablet 0  . sennosides (SENOKOT) 8.8 MG/5ML syrup Take 10 mLs by mouth 2 (two) times daily. 240 mL 0  . tamsulosin (FLOMAX) 0.4 MG CAPS capsule Take 0.4 mg by mouth daily.    . prochlorperazine (COMPAZINE) 10 MG tablet Take 1 tablet (10 mg total) by mouth every 6 (six) hours as needed for nausea or vomiting. 30 tablet 1   No current facility-administered medications for this visit.   Facility-Administered Medications Ordered in Other Visits  Medication Dose Route Frequency Provider Last Rate Last Admin  . 0.9 %  sodium chloride infusion    Intravenous Continuous Ladell Pier, MD      . dexamethasone (DECADRON) 10 mg in sodium chloride 0.9 % 50 mL IVPB  10 mg Intravenous Once Ladell Pier, MD      . dextrose 5 % solution   Intravenous Once Ladell Pier, MD      . fluorouracil (ADRUCIL) 4,000 mg in sodium chloride 0.9 % 70 mL chemo infusion  2,000 mg/m2 (Treatment Plan Recorded) Intravenous 1 day or 1 dose Ladell Pier, MD      . fluorouracil (ADRUCIL) chemo injection 600 mg  300 mg/m2 (Treatment Plan Recorded) Intravenous Once Ladell Pier, MD      . leucovorin 604 mg in dextrose 5 % 250 mL infusion  300 mg/m2 (Treatment Plan Recorded) Intravenous Once Ladell Pier, MD      . oxaliplatin (ELOXATIN) 170 mg in dextrose 5 % 500 mL chemo infusion  85 mg/m2 (Treatment Plan Recorded) Intravenous Once Ladell Pier, MD      . pembrolizumab [facility]) 200 mg in sodium chloride 0.9 % 50 mL chemo infusion  200 mg Intravenous Once Betsy Coder B, MD      . sodium chloride flush (NS) 0.9 % injection 10 mL  10 mL Intracatheter PRN Ladell Pier, MD      . Theotis [facility]) 504 mg in sodium chloride 0.9 % 250 mL chemo infusion  6 mg/kg (Treatment Plan Recorded) Intravenous Once Ladell Pier, MD        PHYSICAL EXAMINATION: ECOG PERFORMANCE STATUS: 1 - Symptomatic but completely ambulatory  Vitals:   01/31/20 0922  BP: (!) 144/71  Pulse: (!) 108  Resp: 14  Temp: 99.5 F (37.5 C)  SpO2: 100%   Filed Weights   01/31/20 0922 01/31/20 0925  Weight: 191 lb 11.2 oz (87 kg) 191 lb 11.2 oz (87 kg)    GENERAL:alert, no distress and comfortable SKIN: no rash to exposed skin  EYES:  sclera clear NECK: without mass LUNGS: clear with normal breathing effort HEART: tachycardic, regular rhythm, mild bilateral lower extremity and pedal edema ABDOMEN: distended non-tender and normal bowel sounds Musculoskeletal: abrasion to left heel, no tissue edema, erythema, or drainage   NEURO: alert & oriented x 3  with fluent speech PAC without erythema   LABORATORY DATA:  I have reviewed the data as listed CBC Latest Ref Rng & Units 01/31/2020 01/29/2020 01/28/2020  WBC 4.0 - 10.5 K/uL 9.7 - -  Hemoglobin 13.0 - 17.0 g/dL 8.3(L) 8.0(L) 8.3(L)  Hematocrit 39 - 52 % 26.7(L) 25.6(L) 26.1(L)  Platelets 150 - 400 K/uL 356 - -     CMP Latest Ref Rng & Units 01/31/2020 01/29/2020 01/28/2020  Glucose 70 - 99 mg/dL 213(H) 143(H) 144(H)  BUN 8 - 23 mg/dL 7(L) 6(L) <5(L)  Creatinine 0.61 - 1.24 mg/dL 0.84 0.75 0.73  Sodium 135 - 145 mmol/L 138 137 135  Potassium 3.5 - 5.1 mmol/L 3.6 3.3(L) 2.9(L)  Chloride 98 - 111 mmol/L 103 102 99  CO2 22 - 32 mmol/L _0 Calcium 8.9 - 10.3 mg/dL 8.6(L) 8.4(L) 8.4(L)  Total Protein 6.5 - 8.1 g/dL 6.4(L) - -  Total Bilirubin 0.3 - 1.2 mg/dL 0.5 - -  Alkaline Phos 38 - 126 U/L 90 - -  AST 15 - 41 U/L 28 - -  ALT 0 - 44 U/L 11 - -      RADIOGRAPHIC STUDIES: I have personally reviewed the radiological images as listed and agreed with the findings in the report. No results found.   ASSESSMENT & PLAN:   1.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  CT abdomen/pelvis at [facility] 01/03/2020-masslike wall thickening of the gastric antrum, numerous liver metastases, small volume ascites, numerous peritoneal nodules, enlarged periaortic and porta hepatis lymph nodes  Upper endoscopy 01/06/2020-large ulcer in the gastric antrum with heaped up and firm edges, Duodenal bulb largely "obliterated "by the mass, biopsy confirmed adenocarcinoma, HER-2 3+, intact mismatch repair protein expression  CT chest 01/10/2020-acute bilateral pulmonary emboli, no lymphadenopathy or suspicious lung nodules  Baseline echo 01/17/20 at [facility], EF 60-65%  CT abdomen/pelvis 01/22/2020, gastric mass, liver metastases, peritoneal/omental nodules, ascites, right pleural effusion  Therapeutic and diagnostic paracentesis on 01/27/2020, 2.1 L removed; cytology shows  malignant cells, malignant ascites  PD-L1 pending on initial biopsy (at [facility]) and ascites fluid from 01/27/2020  Cycle 1 FOLFOX with trastuzumab and pembrolizumab, 01/31/2020  2.Nausea/vomiting secondary to #1 with gastric outlet obstruction  Gastric stent placement 01/12/2020, n/v resolved   3.Acute bilateral lower lobe pulmonary emboli on CT 01/10/2020-treated with apixaban, (given Lovenox during hospitalization)  4.Anemia-likely secondary to bleeding from the gastric mass  5.Status post Port-A-Cath placement 01/13/2020  6.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 Pattison appears stable.  He has recovered from recent hospitalization with good performance status.  We reviewed cytology from the paracentesis on 7/16 which shows malignant cells.  He presents with abdominal distention consistent with fluid re-accumulation.  He has been scheduled for a therapeutic paracentesis on 02/03/20.  CBC and CMP adequate to proceed with cycle 1 dose-reduced FOLFOX with trastuzumab and pembrolizumab today.  He understands the goal is palliative. TSH is normal.  We will follow-up on the pending PD-L1 report.  We again reviewed potential toxicities, symptom management, and signs and symptoms to call and report.  I have called in Compazine.  He will return for toxicity check on 7/28.  The patient was seen today with Dr. Benay Spice.  He has been referred to social work for transportation and to complete advanced directives.  His daughter will be in town for the next few weeks and has plans to meet with the financial advocate.  Their  questions were answered at this time.   Orders Placed This Encounter  Procedures  . US Paracentesis    Standing Status:   Future    Standing Expiration Date:   01/30/2021    Order Specific Question:   If therapeutic, is there a maximum amount of fluid to be removed?    Answer:   Yes  Order Specific Question:   What is the maximum amount of fluide to be removed?    Answer:   5    Order Specific Question:   Are labs required for specimen collection?    Answer:   No    Order Specific Question:   Is Albumin medication needed?    Answer:   No    Order Specific Question:   Reason for Exam (SYMPTOM  OR DIAGNOSIS REQUIRED)    Answer:   therapeutic, malignant ascites    Order Specific Question:   Preferred imaging location?    Answer:   [facility]  . [facility] (IN HOUSE-[facility])    Standing Status:   Future    Standing Expiration Date:   01/30/2021  . CBC with Differential (Cancer Center Only)    Standing Status:   Future    Standing Expiration Date:   01/30/2021  . CMP ([facility] only)    Standing Status:   Future    Standing Expiration Date:   01/30/2021  . Ambulatory referral to Social Work    Referral Priority:   Routine    Referral Type:   Consultation    Referral Reason:   Specialty Services Required    Number of Visits Requested:   1  . Sample to Blood Bank    Standing Status:   Future    Standing Expiration Date:   01/30/2021   All questions were answered. The patient knows to call the clinic with any problems, questions or concerns. No barriers to learning were detected.     Alla Feeling, NP 01/31/20  This was a shared visit with Hilma Favors.  Mr. Tarbet was interviewed and examined.  He appears stable to begin treatment today.  The plan is to proceed with FOLFOX/Herceptin/pembrolizumab.  We reviewed the treatment plan with Mr. Dumont and his daughter.  He will hold aspirin and Lasix.  We will recommend resuming Lovenox and discontinuing apixaban if he has GI  bleeding.  Mr. Baratta will return for an office visit next week.  Julieanne Manson, MD

## 2020-01-31 NOTE — Progress Notes (Signed)
Met with patient and his daughter Drew [name] today for first treatment.  She was given the number for [employer] as well as Stefanie Libel in [employer].  I have made a referral to CSW and sent them a staff message regarding his needs.

## 2020-02-01 ENCOUNTER — Other Ambulatory Visit: Payer: Self-pay

## 2020-02-01 DIAGNOSIS — Z483 Aftercare following surgery for neoplasm: Secondary | ICD-10-CM | POA: Diagnosis not present

## 2020-02-01 DIAGNOSIS — C786 Secondary malignant neoplasm of retroperitoneum and peritoneum: Secondary | ICD-10-CM | POA: Diagnosis not present

## 2020-02-01 DIAGNOSIS — C169 Malignant neoplasm of stomach, unspecified: Secondary | ICD-10-CM | POA: Diagnosis not present

## 2020-02-01 DIAGNOSIS — K567 Ileus, unspecified: Secondary | ICD-10-CM | POA: Diagnosis not present

## 2020-02-01 DIAGNOSIS — A419 Sepsis, unspecified organism: Secondary | ICD-10-CM | POA: Diagnosis not present

## 2020-02-01 DIAGNOSIS — I10 Essential (primary) hypertension: Secondary | ICD-10-CM | POA: Diagnosis not present

## 2020-02-01 DIAGNOSIS — J189 Pneumonia, unspecified organism: Secondary | ICD-10-CM | POA: Diagnosis not present

## 2020-02-01 DIAGNOSIS — C779 Secondary and unspecified malignant neoplasm of lymph node, unspecified: Secondary | ICD-10-CM | POA: Diagnosis not present

## 2020-02-01 DIAGNOSIS — C787 Secondary malignant neoplasm of liver and intrahepatic bile duct: Secondary | ICD-10-CM | POA: Diagnosis not present

## 2020-02-01 LAB — CULTURE, BODY FLUID W GRAM STAIN -BOTTLE: Culture: NO GROWTH

## 2020-02-01 LAB — TOTAL BILIRUBIN, BODY FLUID: Total bilirubin, fluid: <0.2 mg/dL

## 2020-02-01 LAB — CHOLESTEROL, BODY FLUID: Cholesterol, Fluid: 62 mg/dL

## 2020-02-02 ENCOUNTER — Inpatient Hospital Stay: Payer: Medicare PPO

## 2020-02-02 ENCOUNTER — Other Ambulatory Visit: Payer: Self-pay

## 2020-02-02 ENCOUNTER — Encounter: Payer: Self-pay | Admitting: Oncology

## 2020-02-02 ENCOUNTER — Telehealth: Payer: Self-pay | Admitting: *Deleted

## 2020-02-02 VITALS — BP 156/73 | HR 107 | Temp 98.5°F | Resp 18

## 2020-02-02 DIAGNOSIS — R599 Enlarged lymph nodes, unspecified: Secondary | ICD-10-CM | POA: Diagnosis not present

## 2020-02-02 DIAGNOSIS — C169 Malignant neoplasm of stomach, unspecified: Secondary | ICD-10-CM | POA: Diagnosis not present

## 2020-02-02 DIAGNOSIS — Z5112 Encounter for antineoplastic immunotherapy: Secondary | ICD-10-CM | POA: Diagnosis not present

## 2020-02-02 DIAGNOSIS — D649 Anemia, unspecified: Secondary | ICD-10-CM | POA: Diagnosis not present

## 2020-02-02 DIAGNOSIS — R5383 Other fatigue: Secondary | ICD-10-CM | POA: Diagnosis not present

## 2020-02-02 DIAGNOSIS — K5900 Constipation, unspecified: Secondary | ICD-10-CM | POA: Diagnosis not present

## 2020-02-02 DIAGNOSIS — Z5111 Encounter for antineoplastic chemotherapy: Secondary | ICD-10-CM | POA: Diagnosis not present

## 2020-02-02 DIAGNOSIS — R141 Gas pain: Secondary | ICD-10-CM | POA: Diagnosis not present

## 2020-02-02 DIAGNOSIS — C787 Secondary malignant neoplasm of liver and intrahepatic bile duct: Secondary | ICD-10-CM | POA: Diagnosis not present

## 2020-02-02 MED ORDER — HEPARIN SOD (PORK) LOCK FLUSH 100 UNIT/ML IV SOLN
500.0000 [IU] | Freq: Once | INTRAVENOUS | Status: AC | PRN
  Administered 2020-02-02: 500 [IU]
  Filled 2020-02-02: qty 5

## 2020-02-02 MED ORDER — SODIUM CHLORIDE 0.9% FLUSH
10.0000 mL | INTRAVENOUS | Status: DC | PRN
  Administered 2020-02-02: 10 mL
  Filled 2020-02-02: qty 10

## 2020-02-02 NOTE — Progress Notes (Signed)
Met with patient in blue waiting room to introduce myself as Arboriculturist and to offer available resources.  Discussed one-time $1000 Radio broadcast assistant to assist with personal expenses while going through treatment.  Gave him my card if interested in applying and for any additional financial questions or concerns.

## 2020-02-02 NOTE — Telephone Encounter (Signed)
Admitted to services today. Requesting order to approve RN visit X1 this week; X 2 next 2 weeks, then 1 x week for 2 weeks. Can extend if needed. Also requesting script for 3 in 1 commode be faxed to [facility] [phone]) and they will forward to DME. Provided OK for nursing visits and script sent. She also reports that [facility] will be seeing him for [facility] as well.

## 2020-02-02 NOTE — Telephone Encounter (Signed)
Reports fatigue, but no other adverse effects from chemotherapy. Had small BM yesterday. Informed him that FOLFOX can cause constipation, so be sure to stay on top of that. He is able to eat and drink without issue. Will come in later today for pump d/c. He denied having any questions or concerns at this time.

## 2020-02-03 ENCOUNTER — Telehealth: Payer: Self-pay | Admitting: Family Medicine

## 2020-02-03 ENCOUNTER — Ambulatory Visit (HOSPITAL_COMMUNITY)
Admission: RE | Admit: 2020-02-03 | Discharge: 2020-02-03 | Disposition: A | Discharge status: Home / Self Care | Payer: Medicare PPO | Source: Ambulatory Visit | Attending: Nurse Practitioner | Admitting: Nurse Practitioner

## 2020-02-03 ENCOUNTER — Other Ambulatory Visit: Payer: Self-pay | Admitting: Nurse Practitioner

## 2020-02-03 DIAGNOSIS — A419 Sepsis, unspecified organism: Secondary | ICD-10-CM | POA: Diagnosis not present

## 2020-02-03 DIAGNOSIS — J189 Pneumonia, unspecified organism: Secondary | ICD-10-CM | POA: Diagnosis not present

## 2020-02-03 DIAGNOSIS — R180 Malignant ascites: Secondary | ICD-10-CM | POA: Diagnosis not present

## 2020-02-03 DIAGNOSIS — K567 Ileus, unspecified: Secondary | ICD-10-CM | POA: Diagnosis not present

## 2020-02-03 DIAGNOSIS — C786 Secondary malignant neoplasm of retroperitoneum and peritoneum: Secondary | ICD-10-CM | POA: Diagnosis not present

## 2020-02-03 DIAGNOSIS — C169 Malignant neoplasm of stomach, unspecified: Secondary | ICD-10-CM | POA: Insufficient documentation

## 2020-02-03 DIAGNOSIS — I10 Essential (primary) hypertension: Secondary | ICD-10-CM | POA: Diagnosis not present

## 2020-02-03 DIAGNOSIS — C779 Secondary and unspecified malignant neoplasm of lymph node, unspecified: Secondary | ICD-10-CM | POA: Diagnosis not present

## 2020-02-03 DIAGNOSIS — R188 Other ascites: Secondary | ICD-10-CM | POA: Diagnosis not present

## 2020-02-03 DIAGNOSIS — C787 Secondary malignant neoplasm of liver and intrahepatic bile duct: Secondary | ICD-10-CM | POA: Diagnosis not present

## 2020-02-03 DIAGNOSIS — Z483 Aftercare following surgery for neoplasm: Secondary | ICD-10-CM | POA: Diagnosis not present

## 2020-02-03 NOTE — Progress Notes (Signed)
Limited US Abdomen today shows a very small pocket of fluid with limited window for paracentesis. Patient just had 2.1 liters fluid removed 7/16.  No procedure performed today.  Patient discharged home.   Brynda Greathouse, MS RD PA-C 9:49 AM

## 2020-02-03 NOTE — Telephone Encounter (Signed)
   KAZUO DURNIL DOB: 05-08-1946 MRN: [id number]   RIDER WAIVER AND RELEASE OF LIABILITY  For purposes of improving physical access to our facilities, [facility] is pleased to partner with third parties to provide [facility] patients or other authorized individuals the option of convenient, on-demand ground transportation services (the [place]") through use of the technology service that enables users to request on-demand ground transportation from independent third-party providers.  By opting to use and accept these [employer], I, the undersigned, hereby agree on behalf of myself, and on behalf of any minor child using the [employer] for whom I am the parent or legal guardian, as follows:  1. Government social research officer provided to me are provided by independent third-party transportation providers who are not [employer] or employees and who are unaffiliated with [employer]. 2. [facility] is neither a transportation carrier nor a common or public carrier. 3. [facility] has no control over the quality or safety of the transportation that occurs as a result of the [employer]. 4. [facility] cannot guarantee that any third-party transportation provider will complete any arranged transportation service. 5. [facility] makes no representation, warranty, or guarantee regarding the reliability, timeliness, quality, safety, suitability, or availability of any of the Transport Services or that they will be error free. 6. I fully understand that traveling by vehicle involves risks and dangers of serious bodily injury, including permanent disability, paralysis, and death. I agree, on behalf of myself and on behalf of any minor child using the Transport Services for whom I am the parent or legal guardian, that the entire risk arising out of my use of the [employer] remains solely with me, to the maximum extent permitted under applicable law. 7. The [employer] are provided "as is" and "as available." [facility] disclaims all representations and warranties, express, implied or statutory, not expressly set out in these terms, including the implied warranties of merchantability and fitness for a particular purpose. 8. I hereby waive and release [facility], its agents, employees, officers, directors, representatives, insurers, attorneys, assigns, successors, subsidiaries, and affiliates from any and all past, present, or future claims, demands, liabilities, actions, causes of action, or suits of any kind directly or indirectly arising from acceptance and use of the [employer]. 9. I further waive and release [facility] and its affiliates from all present and future liability and responsibility for any injury or death to persons or damages to property caused by or related to the use of the [employer]. 10. I have read this Waiver and Release of Liability, and I understand the terms used in it and their legal significance. This Waiver is freely and voluntarily given with the understanding that my right (as well as the right of any minor child for whom I am the parent or legal guardian using the [employer]) to legal recourse against [facility] in connection with the [employer] is knowingly surrendered in return for use of these services.   I attest that I read the consent document to Curtis Sites, gave Mr. Berger the opportunity to ask questions and answered the questions asked (if any). I affirm that Curtis Sites then provided consent for he's participation in this program.     Legrand Pitts

## 2020-02-07 NOTE — Progress Notes (Signed)
[facility]   [phone] [phone]   Clinic Follow up Note   Patient Care Team: Gaynelle Arabian, MD as PCP - General (Family Medicine) Jonnie Finner, RN as Oncology Nurse Navigator Ladell Pier, MD as Consulting Physician (Oncology) 02/08/2020  CHIEF COMPLAINT: Metastatic gastric cancer  CURRENT THERAPY:  FOLFOX with trastuzumab (q14 days) and pembrolizumab (q21 days), starting 01/31/20  INTERVAL HISTORY: Drew [name] returns for follow-up as scheduled.  He completed cycle 1 FOLFOX, trastuzumab, and pembrolizumab on 01/31/2020.  The treatment itself went well.  Two days ago he developed moderate fatigue, he remains functional but "just tired."  He naps during the day.  He has no increased bloating, leg edema has improved.  Denies new or worsening abdominal pain.  Takes Tylenol at night.  Denies nausea/vomiting on daily Compazine.  He is dealing with constipation, noted blood with BM x1 this morning after straining.  His weight is decreased he attributes mostly to fluid.  He is trying to eat small frequent meals with protein and Ensure.  He is staying hydrated.  Denies mucositis.  He is sensitive to cold without residual neuropathy.  Denies recent fever, chills, cough, chest pain, dyspnea, rash, hand-foot redness or sensitivity.   MEDICAL HISTORY:  Past Medical History:  Diagnosis Date  . ED (erectile dysfunction)   . GERD (gastroesophageal reflux disease)   . High cholesterol   . Hypertension   . Hypogonadism male   . Prediabetes   . PUD (peptic ulcer disease)     SURGICAL HISTORY: Past Surgical History:  Procedure Laterality Date  . FINGER AMPUTATION    . TONSILLECTOMY      I have reviewed the social history and family history with the patient and they are unchanged from previous note.  ALLERGIES:  has No Known Allergies.  MEDICATIONS:  Current Outpatient Medications  Medication Sig Dispense Refill  . bisacodyl (DULCOLAX) 10 MG  suppository Place 1 suppository (10 mg total) rectally as needed for moderate constipation. 12 suppository 0  . ELIQUIS 5 MG TABS tablet Take 5 mg by mouth 2 (two) times daily.    . FEROSUL 325 (65 Fe) MG tablet Take 325 mg by mouth daily.    Marland Kitchen lidocaine-prilocaine (EMLA) cream Apply 1 application topically as needed. 30 g 2  . metoprolol succinate (TOPROL-XL) 25 MG 24 hr tablet Take 25 mg by mouth daily.    . Omega-3 Fatty Acids (FISH OIL) 1000 MG CAPS Take 2 capsules by mouth daily.    . ondansetron (ZOFRAN) 4 MG tablet Take 4 mg by mouth every 8 (eight) hours as needed for nausea or vomiting.     . pantoprazole (PROTONIX) 40 MG tablet Take 1 tablet (40 mg total) by mouth 2 (two) times daily. 60 tablet 11  . polyethylene glycol (MIRALAX / GLYCOLAX) 17 g packet Take 17 g by mouth daily as needed. 14 each 0  . potassium chloride SA (KLOR-CON) 20 MEQ tablet Take 1 tablet (20 mEq total) by mouth daily for 14 doses. 14 tablet 0  . prochlorperazine (COMPAZINE) 10 MG tablet Take 1 tablet (10 mg total) by mouth every 6 (six) hours as needed for nausea or vomiting. 30 tablet 1  . sennosides (SENOKOT) 8.8 MG/5ML syrup Take 10 mLs by mouth 2 (two) times daily. 240 mL 0  . atorvastatin (LIPITOR) 10 MG tablet Take 10 mg by mouth daily.    Marland Kitchen HYDROcodone-acetaminophen (NORCO/VICODIN) 5-325 MG tablet Take 1 tablet by mouth every 6 (six)  hours as needed for moderate pain.     . tamsulosin (FLOMAX) 0.4 MG CAPS capsule Take 0.4 mg by mouth daily.     No current facility-administered medications for this visit.    PHYSICAL EXAMINATION: ECOG PERFORMANCE STATUS: 1-2  Vitals:   02/08/20 1152  BP: (!) 142/85  Pulse: 100  Resp: 20  Temp: 98.6 F (37 C)  SpO2: 100%   Filed Weights   02/08/20 1200  Weight: 170 lb 6.4 oz (77.3 kg)    GENERAL:alert, no distress and comfortable SKIN: No rash to exposed skin EYES:  sclera clear NECK: Without mass LUNGS: Decreased in bases, otherwise clear with normal  breathing effort HEART: regular rate & rhythm, trace lower extremity edema ABDOMEN:abdomen soft, non-tender and normal bowel sounds NEURO: alert & oriented x 3 with fluent speech, no focal motor/sensory deficits PAC without erythema  LABORATORY DATA:  I have reviewed the data as listed CBC Latest Ref Rng & Units 02/08/2020 01/31/2020 01/29/2020  WBC 4.0 - 10.5 K/uL 7.6 9.7 -  Hemoglobin 13.0 - 17.0 g/dL 8.4(L) 8.3(L) 8.0(L)  Hematocrit 39 - 52 % 27.7(L) 26.7(L) 25.6(L)  Platelets 150 - 400 K/uL 409(H) 356 -     CMP Latest Ref Rng & Units 02/08/2020 01/31/2020 01/29/2020  Glucose 70 - 99 mg/dL 151(H) 213(H) 143(H)  BUN 8 - 23 mg/dL 9 7(L) 6(L)  Creatinine 0.61 - 1.24 mg/dL 0.84 0.84 0.75  Sodium 135 - 145 mmol/L 136 138 137  Potassium 3.5 - 5.1 mmol/L 4.0 3.6 3.3(L)  Chloride 98 - 111 mmol/L 102 103 102  CO2 22 - 32 mmol/L 26 25 26  Calcium 8.9 - 10.3 mg/dL 9.6 8.6(L) 8.4(L)  Total Protein 6.5 - 8.1 g/dL 6.9 6.4(L) -  Total Bilirubin 0.3 - 1.2 mg/dL 0.4 0.5 -  Alkaline Phos 38 - 126 U/L 117 90 -  AST 15 - 41 U/L 35 28 -  ALT 0 - 44 U/L 24 11 -      RADIOGRAPHIC STUDIES: I have personally reviewed the radiological images as listed and agreed with the findings in the report. No results found.   ASSESSMENT & PLAN:    1.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  CT abdomen/pelvis at [facility] 01/03/2020-masslike wall thickening of the gastric antrum, numerous liver metastases, small volume ascites, numerous peritoneal nodules, enlarged periaortic and porta hepatis lymph nodes  Upper endoscopy 01/06/2020-large ulcer in the gastric antrum with heaped up and firm edges, Duodenal bulb largely "obliterated "by the mass, biopsy confirmed adenocarcinoma, HER-2 3+, intact mismatch repair protein expression  CT chest 01/10/2020-acute bilateral pulmonary emboli, no lymphadenopathy or suspicious lung nodules  Baseline echo 01/17/20 at [facility], EF 60-65%  CT  abdomen/pelvis 01/22/2020, gastric mass, liver metastases, peritoneal/omental nodules, ascites, right pleural effusion  Therapeutic and diagnostic paracentesis on 01/27/2020, 2.1 L removed; cytology shows malignant cells, malignant ascites  PD-L1 combined positive score 1% (from ascites/periteoneal fluid), reported 02/01/20   Cycle 1 FOLFOX with trastuzumab and pembrolizumab, 01/31/2020  Baseline CEA 13.6 (02/08/2020)  2.Nausea/vomiting secondary to #1 with gastric outlet obstruction  Gastric stent placement 01/12/2020, n/v resolved   3.Acute bilateral lower lobe pulmonary emboli on CT 01/10/2020-treated with apixaban, (given Lovenox during hospitalization)  4.Anemia-likely secondary to bleeding from the gastric mass  5.Status post Port-A-Cath placement 01/13/2020  6.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 Drew [name] appears stable.  He received cycle 1 FOLFOX, trastuzumab, and pembrolizumab on 01/31/2020.  He tolerated treatment well with fatigue and constipation.  He had one episode of rectal bleeding with straining, on Eliquis.  We reviewed symptom management, he will begin bowel regimen today. If he has evidence of worsening GI bleeding we will likely recommend resuming lovenox and discontinuing eliquis.   Encouraged him to remain hydrated and active to improve fatigue. He is recovering well. No need for supportive care today.   He has had dramatic weight loss after cycle 1, I feel this is largely due to improved ascites and lower extremity edema. He is reportedly eating and drinking and using nutrition supplements.  I have placed an urgent referral to dietitian nonetheless.  CBC and CMP reviewed. Anemia is stable hg 8.4. Baseline CEA is is 13.66.  He will return for lab, follow up, and cycle 2 FOLFOX and trastuzumab next week, then pembrolizumab in 2 weeks. I discussed the case with Dr. Sherrill.   The patient was referred to [facility] neuro but wishes to keep care within the [facility].    No problem-specific Assessment & Plan notes found for this encounter.   Orders Placed This Encounter  Procedures  . Ambulatory referral to Nutrition and Diabetic E    Referral Priority:   Urgent    Referral Type:   Consultation    Referral Reason:   Specialty Services Required    Number of Visits Requested:   1  . Ambulatory referral to Neurology    Referral Priority:   Routine    Referral Type:   Consultation    Referral Reason:   Specialty Services Required    Requested Specialty:   Neurology    Number of Visits Requested:   1   All questions were answered. The patient knows to call the clinic with any problems, questions or concerns. No barriers to learning were detected.      K , NP 02/08/20    

## 2020-02-08 ENCOUNTER — Inpatient Hospital Stay: Payer: Medicare PPO

## 2020-02-08 ENCOUNTER — Telehealth: Payer: Self-pay | Admitting: Nurse Practitioner

## 2020-02-08 ENCOUNTER — Encounter: Payer: Self-pay | Admitting: Nurse Practitioner

## 2020-02-08 ENCOUNTER — Inpatient Hospital Stay (HOSPITAL_BASED_OUTPATIENT_CLINIC_OR_DEPARTMENT_OTHER): Payer: Medicare PPO | Admitting: Nurse Practitioner

## 2020-02-08 ENCOUNTER — Other Ambulatory Visit: Payer: Self-pay

## 2020-02-08 VITALS — BP 142/85 | HR 100 | Temp 98.6°F | Resp 20 | Ht 67.0 in | Wt 170.4 lb

## 2020-02-08 DIAGNOSIS — C169 Malignant neoplasm of stomach, unspecified: Secondary | ICD-10-CM | POA: Diagnosis not present

## 2020-02-08 DIAGNOSIS — R599 Enlarged lymph nodes, unspecified: Secondary | ICD-10-CM | POA: Diagnosis not present

## 2020-02-08 DIAGNOSIS — D649 Anemia, unspecified: Secondary | ICD-10-CM | POA: Diagnosis not present

## 2020-02-08 DIAGNOSIS — I639 Cerebral infarction, unspecified: Secondary | ICD-10-CM

## 2020-02-08 DIAGNOSIS — Z5112 Encounter for antineoplastic immunotherapy: Secondary | ICD-10-CM | POA: Diagnosis not present

## 2020-02-08 DIAGNOSIS — R5383 Other fatigue: Secondary | ICD-10-CM | POA: Diagnosis not present

## 2020-02-08 DIAGNOSIS — Z95828 Presence of other vascular implants and grafts: Secondary | ICD-10-CM

## 2020-02-08 DIAGNOSIS — K5900 Constipation, unspecified: Secondary | ICD-10-CM | POA: Diagnosis not present

## 2020-02-08 DIAGNOSIS — C787 Secondary malignant neoplasm of liver and intrahepatic bile duct: Secondary | ICD-10-CM | POA: Diagnosis not present

## 2020-02-08 DIAGNOSIS — R141 Gas pain: Secondary | ICD-10-CM | POA: Diagnosis not present

## 2020-02-08 DIAGNOSIS — Z5111 Encounter for antineoplastic chemotherapy: Secondary | ICD-10-CM | POA: Diagnosis not present

## 2020-02-08 LAB — CMP (CANCER CENTER ONLY)
ALT: 24 U/L (ref 0–44)
AST: 35 U/L (ref 15–41)
Albumin: 3.1 g/dL — ABNORMAL LOW (ref 3.5–5.0)
Alkaline Phosphatase: 117 U/L (ref 38–126)
Anion gap: 8 (ref 5–15)
BUN: 9 mg/dL (ref 8–23)
CO2: 26 mmol/L (ref 22–32)
Calcium: 9.6 mg/dL (ref 8.9–10.3)
Chloride: 102 mmol/L (ref 98–111)
Creatinine: 0.84 mg/dL (ref 0.61–1.24)
GFR, Est AFR Am: >60 mL/min (ref >60)
GFR, Estimated: >60 mL/min (ref >60)
Glucose, Bld: 151 mg/dL — ABNORMAL HIGH (ref 70–99)
Potassium: 4 mmol/L (ref 3.5–5.1)
Sodium: 136 mmol/L (ref 135–145)
Total Bilirubin: 0.4 mg/dL (ref 0.3–1.2)
Total Protein: 6.9 g/dL (ref 6.5–8.1)

## 2020-02-08 LAB — CBC WITH DIFFERENTIAL (CANCER CENTER ONLY)
Abs Immature Granulocytes: 0.1 10*3/uL — ABNORMAL HIGH (ref 0.00–0.07)
Basophils Absolute: 0.1 10*3/uL (ref 0.0–0.1)
Basophils Relative: 1 %
Eosinophils Absolute: 0.1 10*3/uL (ref 0.0–0.5)
Eosinophils Relative: 1 %
HCT: 27.7 % — ABNORMAL LOW (ref 39.0–52.0)
Hemoglobin: 8.4 g/dL — ABNORMAL LOW (ref 13.0–17.0)
Immature Granulocytes: 1 %
Lymphocytes Relative: 10 %
Lymphs Abs: 0.8 10*3/uL (ref 0.7–4.0)
MCH: 27.2 pg (ref 26.0–34.0)
MCHC: 30.3 g/dL (ref 30.0–36.0)
MCV: 89.6 fL (ref 80.0–100.0)
Monocytes Absolute: 0.5 10*3/uL (ref 0.1–1.0)
Monocytes Relative: 6 %
Neutro Abs: 6.1 10*3/uL (ref 1.7–7.7)
Neutrophils Relative %: 81 %
Platelet Count: 409 10*3/uL — ABNORMAL HIGH (ref 150–400)
RBC: 3.09 MIL/uL — ABNORMAL LOW (ref 4.22–5.81)
RDW: 17.1 % — ABNORMAL HIGH (ref 11.5–15.5)
WBC Count: 7.6 10*3/uL (ref 4.0–10.5)
nRBC: 0 % (ref 0.0–0.2)

## 2020-02-08 LAB — CEA (IN HOUSE-CHCC): CEA (CHCC-In House): 13.66 ng/mL — ABNORMAL HIGH (ref 0.00–5.00)

## 2020-02-08 LAB — SAMPLE TO BLOOD BANK

## 2020-02-08 MED ORDER — ACETAMINOPHEN 325 MG PO TABS
ORAL_TABLET | ORAL | Status: AC
  Filled 2020-02-08: qty 2

## 2020-02-08 MED ORDER — DIPHENHYDRAMINE HCL 25 MG PO CAPS
ORAL_CAPSULE | ORAL | Status: AC
  Filled 2020-02-08: qty 2

## 2020-02-08 MED ORDER — SODIUM CHLORIDE 0.9% FLUSH
10.0000 mL | Freq: Once | INTRAVENOUS | Status: AC
  Administered 2020-02-08: 10 mL
  Filled 2020-02-08: qty 10

## 2020-02-08 MED ORDER — FAMOTIDINE IN NACL 20-0.9 MG/50ML-% IV SOLN
INTRAVENOUS | Status: AC
  Filled 2020-02-08: qty 50

## 2020-02-08 MED ORDER — HEPARIN SOD (PORK) LOCK FLUSH 100 UNIT/ML IV SOLN
500.0000 [IU] | Freq: Once | INTRAVENOUS | Status: AC
  Administered 2020-02-08: 500 [IU]
  Filled 2020-02-08: qty 5

## 2020-02-08 MED ORDER — MONTELUKAST SODIUM 10 MG PO TABS
ORAL_TABLET | ORAL | Status: AC
  Filled 2020-02-08: qty 1

## 2020-02-08 NOTE — Telephone Encounter (Signed)
Scheduled appt per 7/28 sch msg - pt aware of appt.

## 2020-02-08 NOTE — Patient Instructions (Signed)

## 2020-02-09 ENCOUNTER — Telehealth: Payer: Self-pay | Admitting: Nurse Practitioner

## 2020-02-09 DIAGNOSIS — C169 Malignant neoplasm of stomach, unspecified: Secondary | ICD-10-CM | POA: Diagnosis not present

## 2020-02-09 DIAGNOSIS — Z483 Aftercare following surgery for neoplasm: Secondary | ICD-10-CM | POA: Diagnosis not present

## 2020-02-09 DIAGNOSIS — K567 Ileus, unspecified: Secondary | ICD-10-CM | POA: Diagnosis not present

## 2020-02-09 DIAGNOSIS — C786 Secondary malignant neoplasm of retroperitoneum and peritoneum: Secondary | ICD-10-CM | POA: Diagnosis not present

## 2020-02-09 DIAGNOSIS — J189 Pneumonia, unspecified organism: Secondary | ICD-10-CM | POA: Diagnosis not present

## 2020-02-09 DIAGNOSIS — I10 Essential (primary) hypertension: Secondary | ICD-10-CM | POA: Diagnosis not present

## 2020-02-09 DIAGNOSIS — A419 Sepsis, unspecified organism: Secondary | ICD-10-CM | POA: Diagnosis not present

## 2020-02-09 DIAGNOSIS — C779 Secondary and unspecified malignant neoplasm of lymph node, unspecified: Secondary | ICD-10-CM | POA: Diagnosis not present

## 2020-02-09 DIAGNOSIS — C787 Secondary malignant neoplasm of liver and intrahepatic bile duct: Secondary | ICD-10-CM | POA: Diagnosis not present

## 2020-02-09 NOTE — Telephone Encounter (Signed)
Scheduled per 7/28 los. Noted to give pt appt calendar on next visit.

## 2020-02-09 NOTE — Telephone Encounter (Signed)
Scheduled per 7/28 los. Noted to give pt appt calendar. No availability with [facility] on 8/11.

## 2020-02-10 ENCOUNTER — Encounter: Payer: Self-pay | Admitting: Neurology

## 2020-02-12 ENCOUNTER — Other Ambulatory Visit: Payer: Self-pay

## 2020-02-12 ENCOUNTER — Other Ambulatory Visit: Payer: Self-pay | Admitting: Oncology

## 2020-02-12 DIAGNOSIS — C482 Malignant neoplasm of peritoneum, unspecified: Secondary | ICD-10-CM | POA: Diagnosis not present

## 2020-02-12 DIAGNOSIS — I10 Essential (primary) hypertension: Secondary | ICD-10-CM | POA: Insufficient documentation

## 2020-02-12 DIAGNOSIS — R0902 Hypoxemia: Secondary | ICD-10-CM | POA: Diagnosis not present

## 2020-02-12 DIAGNOSIS — R109 Unspecified abdominal pain: Secondary | ICD-10-CM | POA: Insufficient documentation

## 2020-02-12 DIAGNOSIS — K5900 Constipation, unspecified: Secondary | ICD-10-CM | POA: Insufficient documentation

## 2020-02-12 DIAGNOSIS — C169 Malignant neoplasm of stomach, unspecified: Secondary | ICD-10-CM | POA: Diagnosis not present

## 2020-02-12 DIAGNOSIS — C787 Secondary malignant neoplasm of liver and intrahepatic bile duct: Secondary | ICD-10-CM | POA: Diagnosis not present

## 2020-02-12 DIAGNOSIS — J90 Pleural effusion, not elsewhere classified: Secondary | ICD-10-CM | POA: Diagnosis not present

## 2020-02-12 DIAGNOSIS — K219 Gastro-esophageal reflux disease without esophagitis: Secondary | ICD-10-CM | POA: Diagnosis not present

## 2020-02-12 DIAGNOSIS — R10816 Epigastric abdominal tenderness: Secondary | ICD-10-CM | POA: Diagnosis not present

## 2020-02-12 DIAGNOSIS — R509 Fever, unspecified: Secondary | ICD-10-CM | POA: Diagnosis not present

## 2020-02-12 DIAGNOSIS — R000 Tachycardia, unspecified: Secondary | ICD-10-CM | POA: Diagnosis not present

## 2020-02-12 DIAGNOSIS — Z87891 Personal history of nicotine dependence: Secondary | ICD-10-CM | POA: Diagnosis not present

## 2020-02-13 ENCOUNTER — Emergency Department (HOSPITAL_COMMUNITY): Payer: Medicare PPO

## 2020-02-13 ENCOUNTER — Encounter (HOSPITAL_COMMUNITY): Payer: Self-pay | Admitting: Emergency Medicine

## 2020-02-13 ENCOUNTER — Other Ambulatory Visit: Payer: Self-pay

## 2020-02-13 ENCOUNTER — Telehealth: Payer: Self-pay | Admitting: *Deleted

## 2020-02-13 ENCOUNTER — Emergency Department (HOSPITAL_COMMUNITY)
Admission: EM | Admit: 2020-02-13 | Discharge: 2020-02-13 | Disposition: A | Discharge status: Home / Self Care | Payer: Medicare PPO | Attending: Emergency Medicine | Admitting: Emergency Medicine

## 2020-02-13 DIAGNOSIS — C787 Secondary malignant neoplasm of liver and intrahepatic bile duct: Secondary | ICD-10-CM | POA: Diagnosis not present

## 2020-02-13 DIAGNOSIS — C762 Malignant neoplasm of abdomen: Secondary | ICD-10-CM

## 2020-02-13 DIAGNOSIS — C169 Malignant neoplasm of stomach, unspecified: Secondary | ICD-10-CM | POA: Diagnosis not present

## 2020-02-13 DIAGNOSIS — J90 Pleural effusion, not elsewhere classified: Secondary | ICD-10-CM | POA: Diagnosis not present

## 2020-02-13 DIAGNOSIS — C482 Malignant neoplasm of peritoneum, unspecified: Secondary | ICD-10-CM | POA: Diagnosis not present

## 2020-02-13 LAB — CBC WITH DIFFERENTIAL/PLATELET
Abs Immature Granulocytes: 0.06 10*3/uL (ref 0.00–0.07)
Basophils Absolute: 0 10*3/uL (ref 0.0–0.1)
Basophils Relative: 0 %
Eosinophils Absolute: 0 10*3/uL (ref 0.0–0.5)
Eosinophils Relative: 0 %
HCT: 31.1 % — ABNORMAL LOW (ref 39.0–52.0)
Hemoglobin: 9.4 g/dL — ABNORMAL LOW (ref 13.0–17.0)
Immature Granulocytes: 1 %
Lymphocytes Relative: 4 %
Lymphs Abs: 0.4 10*3/uL — ABNORMAL LOW (ref 0.7–4.0)
MCH: 26.6 pg (ref 26.0–34.0)
MCHC: 30.2 g/dL (ref 30.0–36.0)
MCV: 88.1 fL (ref 80.0–100.0)
Monocytes Absolute: 0.1 10*3/uL (ref 0.1–1.0)
Monocytes Relative: 1 %
Neutro Abs: 10.9 10*3/uL — ABNORMAL HIGH (ref 1.7–7.7)
Neutrophils Relative %: 94 %
Platelets: 384 10*3/uL (ref 150–400)
RBC: 3.53 MIL/uL — ABNORMAL LOW (ref 4.22–5.81)
RDW: 17 % — ABNORMAL HIGH (ref 11.5–15.5)
WBC: 11.5 10*3/uL — ABNORMAL HIGH (ref 4.0–10.5)
nRBC: 0 % (ref 0.0–0.2)

## 2020-02-13 LAB — COMPREHENSIVE METABOLIC PANEL
ALT: 23 U/L (ref 0–44)
AST: 37 U/L (ref 15–41)
Albumin: 3.8 g/dL (ref 3.5–5.0)
Alkaline Phosphatase: 115 U/L (ref 38–126)
Anion gap: 14 (ref 5–15)
BUN: 11 mg/dL (ref 8–23)
CO2: 23 mmol/L (ref 22–32)
Calcium: 9.5 mg/dL (ref 8.9–10.3)
Chloride: 100 mmol/L (ref 98–111)
Creatinine, Ser: 0.97 mg/dL (ref 0.61–1.24)
GFR calc Af Amer: >60 mL/min (ref >60)
GFR calc non Af Amer: >60 mL/min (ref >60)
Glucose, Bld: 134 mg/dL — ABNORMAL HIGH (ref 70–99)
Potassium: 3.9 mmol/L (ref 3.5–5.1)
Sodium: 137 mmol/L (ref 135–145)
Total Bilirubin: 0.7 mg/dL (ref 0.3–1.2)
Total Protein: 7.7 g/dL (ref 6.5–8.1)

## 2020-02-13 LAB — URINALYSIS, ROUTINE W REFLEX MICROSCOPIC
Bilirubin Urine: NEGATIVE
Glucose, UA: NEGATIVE mg/dL
Hgb urine dipstick: NEGATIVE
Ketones, ur: NEGATIVE mg/dL
Leukocytes,Ua: NEGATIVE
Nitrite: NEGATIVE
Protein, ur: NEGATIVE mg/dL
Specific Gravity, Urine: 1.02 (ref 1.005–1.030)
pH: 5 (ref 5.0–8.0)

## 2020-02-13 LAB — LIPASE, BLOOD: Lipase: 59 U/L — ABNORMAL HIGH (ref 11–51)

## 2020-02-13 LAB — LACTIC ACID, PLASMA
Lactic Acid, Venous: 3.8 mmol/L (ref 0.5–1.9)
Lactic Acid, Venous: 1.3 mmol/L (ref 0.5–1.9)

## 2020-02-13 MED ORDER — FLEET ENEMA 7-19 GM/118ML RE ENEM
1.0000 | ENEMA | Freq: Once | RECTAL | Status: AC
  Administered 2020-02-13: 1 via RECTAL
  Filled 2020-02-13: qty 1

## 2020-02-13 MED ORDER — IOHEXOL 300 MG/ML  SOLN
100.0000 mL | Freq: Once | INTRAMUSCULAR | Status: AC | PRN
  Administered 2020-02-13: 100 mL via INTRAVENOUS

## 2020-02-13 MED ORDER — SODIUM CHLORIDE 0.9 % IV BOLUS
1000.0000 mL | Freq: Once | INTRAVENOUS | Status: AC
  Administered 2020-02-13: 1000 mL via INTRAVENOUS

## 2020-02-13 MED ORDER — ACETAMINOPHEN 325 MG PO TABS
650.0000 mg | ORAL_TABLET | Freq: Once | ORAL | Status: AC
  Administered 2020-02-13: 650 mg via ORAL
  Filled 2020-02-13: qty 2

## 2020-02-13 MED ORDER — SODIUM CHLORIDE (PF) 0.9 % IJ SOLN
INTRAMUSCULAR | Status: AC
  Administered 2020-02-13: 3 mL via INTRAVENOUS
  Filled 2020-02-13: qty 50

## 2020-02-13 MED ORDER — SODIUM CHLORIDE 0.9% FLUSH: 3.0000 mL | Freq: Once | INTRAVENOUS | Status: AC

## 2020-02-13 NOTE — Telephone Encounter (Signed)
Daughter reports he was in ER last night for chills, fever (100.8), abdominal pain and constipation. Was given IV fluids and enema w/some results. CT showed disease progression. Asking for next steps and what to do if fever/chills return?

## 2020-02-13 NOTE — ED Triage Notes (Signed)
Patient is coming from by [facility]. Patient is complaining of constipation for three days. Patient has a hx of stage 4 abdominal cancer.

## 2020-02-13 NOTE — ED Notes (Signed)
Verbalized understanding discharge instructions and follow-up. In no acute distress.   

## 2020-02-13 NOTE — ED Notes (Signed)
Pt reports having a small BM.  Sts "it was nothing to write home about."

## 2020-02-13 NOTE — ED Provider Notes (Signed)
[facility] DEPT Provider Note   CSN: [id number] Arrival date & time: 02/12/20  2355     History Chief Complaint  Patient presents with  . Fever  . Abdominal Pain    Drew [name] is a 74 y.o. male hx of HL, HTN, here presenting with abdominal pain and constipation, fever.  Patient states that he is constipated for the last 3 days.  Patient tried MiraLAX and stool softeners with no relief.  Patient states that his abdomen is more distended as well.  Denies any vomiting.  Patient was noted to have a fever of 100.8 in the ED.  However he states that he is not running fevers or chills at home.  Denies any cough.  Patient was recently admitted for small bowel obstruction.  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  The history is provided by the patient.       Past Medical History:  Diagnosis Date  . ED (erectile dysfunction)   . GERD (gastroesophageal reflux disease)   . High cholesterol   . Hypertension   . Hypogonadism male   . Prediabetes   . PUD (peptic ulcer disease)     Patient Active Problem List   Diagnosis Date Noted  . Port-A-Cath in place 01/31/2020  . Abdominal distention   . Palliative care by specialist   . DNR (do not resuscitate)   . DNR (do not resuscitate) discussion   . Advanced care planning/counseling discussion   . Goals of care, counseling/discussion 01/26/2020  . Sepsis ([place]) 01/22/2020  . CVA (cerebral vascular accident) ([place]) 01/20/2020  . Gastric outlet obstruction 01/20/2020  . Pulmonary embolism ([place]) 01/20/2020  . Severe protein-calorie malnutrition ([place]) 01/20/2020  . Gastric adenocarcinoma ([place]) 01/17/2020  . Iron deficiency 01/06/2020  . Acute blood loss anemia 01/03/2020  . Acute upper GI bleed 01/03/2020  . Metastasis ([place]) 01/03/2020  . Pleural effusion, right 01/03/2020    Past Surgical History:  Procedure Laterality Date  . FINGER AMPUTATION    . TONSILLECTOMY         Family  History  Problem Relation Age of Onset  . Diabetes Other   . Prostate cancer Brother        both brothers    Social History   Tobacco Use  . Smoking status: Former Research scientist (life sciences)  . Smokeless tobacco: Never Used  Substance Use Topics  . Alcohol use: Yes    Alcohol/week: 10.0 standard drinks    Types: 10 Glasses of wine per week  . Drug use: No    Home Medications Prior to Admission medications   Medication Sig Start Date End Date Taking? Authorizing Provider  atorvastatin (LIPITOR) 10 MG tablet Take 10 mg by mouth daily.    [provider]  bisacodyl (DULCOLAX) 10 MG suppository Place 1 suppository (10 mg total) rectally as needed for moderate constipation. 01/29/20   Antonieta Pert, MD  ELIQUIS 5 MG TABS tablet Take 5 mg by mouth 2 (two) times daily. 01/20/20   [provider]  FEROSUL 325 (65 Fe) MG tablet Take 325 mg by mouth daily. 01/20/20   [provider]  HYDROcodone-acetaminophen (NORCO/VICODIN) 5-325 MG tablet Take 1 tablet by mouth every 6 (six) hours as needed for moderate pain.  01/20/20   [provider]  lidocaine-prilocaine (EMLA) cream Apply 1 application topically as needed. 01/27/20   Ladell Pier, MD  metoprolol succinate (TOPROL-XL) 25 MG 24 hr tablet Take 25 mg by mouth daily. 01/20/20  [provider]  Omega-3 Fatty Acids (FISH OIL) 1000 MG CAPS Take 2 capsules by mouth daily.    [provider]  ondansetron (ZOFRAN) 4 MG tablet Take 4 mg by mouth every 8 (eight) hours as needed for nausea or vomiting.  01/20/20   [provider]  pantoprazole (PROTONIX) 40 MG tablet Take 1 tablet (40 mg total) by mouth 2 (two) times daily. 01/29/20 01/28/21  Antonieta Pert, MD  polyethylene glycol (MIRALAX / GLYCOLAX) 17 g packet Take 17 g by mouth daily as needed. 01/29/20   Antonieta Pert, MD  potassium chloride SA (KLOR-CON) 20 MEQ tablet Take 1 tablet (20 mEq total) by mouth daily for 14 doses. 01/29/20 02/12/20  Antonieta Pert, MD    prochlorperazine (COMPAZINE) 10 MG tablet Take 1 tablet (10 mg total) by mouth every 6 (six) hours as needed for nausea or vomiting. 01/31/20   Alla Feeling, NP  sennosides (SENOKOT) 8.8 MG/5ML syrup Take 10 mLs by mouth 2 (two) times daily. 01/29/20   Antonieta Pert, MD  tamsulosin (FLOMAX) 0.4 MG CAPS capsule Take 0.4 mg by mouth daily. 12/22/19   [provider]    Allergies    Patient has no known allergies.  Review of Systems   Review of Systems  Constitutional: Positive for fever.  Gastrointestinal: Positive for abdominal pain.  All other systems reviewed and are negative.   Physical Exam Updated Vital Signs BP 119/72   Pulse 94   Temp 97.6 F (36.4 C) (Oral)   Resp 17   Ht 5\' 7"  (1.702 m)   Wt 77.1 kg   SpO2 97%   BMI 26.63 kg/m   Physical Exam Vitals and nursing note reviewed.  Constitutional:      Comments: Chronically ill   HENT:     Head: Normocephalic.  Eyes:     Extraocular Movements: Extraocular movements intact.  Cardiovascular:     Rate and Rhythm: Normal rate and regular rhythm.     Heart sounds: Normal heart sounds.  Pulmonary:     Effort: Pulmonary effort is normal.     Breath sounds: Normal breath sounds.  Abdominal:     General: Bowel sounds are normal.     Palpations: Abdomen is soft.     Comments: + distended, + epigastric tenderness   Genitourinary:    Comments: Rectal- minimal stool at the vault  Skin:    General: Skin is warm.     Capillary Refill: Capillary refill takes less than 2 seconds.  Neurological:     General: No focal deficit present.     Mental Status: He is oriented to person, place, and time.  Psychiatric:        Mood and Affect: Mood normal.        Behavior: Behavior normal.     ED Results / Procedures / Treatments   Labs (all labs ordered are listed, but only abnormal results are displayed) Labs Reviewed  LACTIC ACID, PLASMA - Abnormal; Notable for the following components:      Result Value   Lactic  Acid, Venous 3.8 (*)    All other components within normal limits  COMPREHENSIVE METABOLIC PANEL - Abnormal; Notable for the following components:   Glucose, Bld 134 (*)    All other components within normal limits  CBC WITH DIFFERENTIAL/PLATELET - Abnormal; Notable for the following components:   WBC 11.5 (*)    RBC 3.53 (*)    Hemoglobin 9.4 (*)    HCT 31.1 (*)  RDW 17.0 (*)    Neutro Abs 10.9 (*)    Lymphs Abs 0.4 (*)    All other components within normal limits  LIPASE, BLOOD - Abnormal; Notable for the following components:   Lipase 59 (*)    All other components within normal limits  CULTURE, BLOOD (ROUTINE X 2)  CULTURE, BLOOD (ROUTINE X 2)  LACTIC ACID, PLASMA  URINALYSIS, ROUTINE W REFLEX MICROSCOPIC  LACTIC ACID, PLASMA    EKG None  Radiology DG Chest 2 View  Result Date: 02/13/2020 CLINICAL DATA:  Constipation for 3 days. History of stage IV abdominal cancer. EXAM: CHEST - 2 VIEW COMPARISON:  01/22/2020 FINDINGS: Right-sided diffuse a port with catheter tip in the cavoatrial junction region. Mild hyperinflation suggesting emphysema. Normal heart size and pulmonary vascularity. Small right pleural effusion with basilar atelectasis, increasing since prior study. Degenerative changes in the spine. A stent is demonstrated in the upper abdomen. IMPRESSION: Small right pleural effusion with basilar atelectasis, increasing since prior study. Electronically Signed   By: Lucienne Capers M.D.   On: 02/13/2020 00:29   CT ABDOMEN PELVIS W CONTRAST  Result Date: 02/13/2020 CLINICAL DATA:  Abdominal distension, pain, stage IV gastric cancer EXAM: CT ABDOMEN AND PELVIS WITH CONTRAST TECHNIQUE: Multidetector CT imaging of the abdomen and pelvis was performed using the standard protocol following bolus administration of intravenous contrast. CONTRAST:  176mL OMNIPAQUE IOHEXOL 300 MG/ML  SOLN COMPARISON:  January 22, 2020 FINDINGS: Lower chest: The visualized heart size within normal limits.  There is a trace pericardial effusion. Again noted is an anterior epicardial lymph node measuring up to 1.2 cm in maximum dimension, slightly increased in size from the prior exam. A small right pleural effusion is noted with basilar atelectasis. Hepatobiliary: Numerous peripherally enhancing hypodense lesions are seen throughout the liver parenchyma as on the prior exam. These are not significantly changed in size from the prior exam for reference in the anterior left liver lesion measuring 1.5 cm. The portal vein is patent. There is mild periportal edema. The gallbladder is partially decompressed. Pancreas: Unremarkable. No pancreatic ductal dilatation or surrounding inflammatory changes. Spleen: Normal in size without focal abnormality. Adrenals/Urinary Tract: Both adrenal glands appear normal. Bilateral low-density lesions are again identified, likely renal cyst. No renal or collecting system calculi. The bladder is partially decompressed. Stomach/Bowel: Again noted is a distal gastric stent with diffuse circumferential wall thickening. There is fluid seen within the gastric stent. The small bowel and colon are grossly unremarkable. There is a moderate amount of colonic stool present. Vascular/Lymphatic: Again noted is diffuse peritoneal carcinomatosis with omental and peritoneal studding with slight interval progression from the prior exam. There is slight interval increase in size in the peritoneal implants seen adjacent to the greater curvature of the stomach now measuring up to 1.4 cm and previously 1.0 cm. A small amount of abdominopelvic ascites is seen. Reproductive: The prostate is unremarkable. Other: No evidence of abdominal wall mass or hernia. Musculoskeletal: No acute or significant osseous findings. IMPRESSION: 1. Interval progression in the diffuse peritoneal carcinomatosis with interval increase in number and size of the peritoneal/omental studding. 2. Unchanged extensive hepatic metastases. 3.  Slight interval increase in the epicardial lymphadenopathy 4. Small right pleural effusion 5. Small amount of abdominopelvic ascites 6.  Aortic Atherosclerosis (ICD10-I70.0). Electronically Signed   By: Prudencio Pair M.D.   On: 02/13/2020 02:14    Procedures Procedures (including critical care time)  Medications Ordered in ED Medications  sodium chloride flush (NS) 0.9 %  injection 3 mL (3 mLs Intravenous Given 02/13/20 0440)  acetaminophen (TYLENOL) tablet 650 mg (650 mg Oral Given 02/13/20 0302)  sodium chloride 0.9 % bolus 1,000 mL (0 mLs Intravenous Stopped 02/13/20 0412)  sodium phosphate (FLEET) 7-19 GM/118ML enema 1 enema (1 enema Rectal Given 02/13/20 0304)  iohexol (OMNIPAQUE) 300 MG/ML solution 100 mL (100 mLs Intravenous Contrast Given 02/13/20 0148)    ED Course  I have reviewed the triage vital signs and the nursing notes.  Pertinent labs & imaging results that were available during my care of the patient were reviewed by me and considered in my medical decision making (see chart for details).    MDM Rules/Calculators/A&P                          KAREY STUCKI is a 74 y.o. male here presenting with abdominal pain and constipation.  Patient's rectal exam showed no stool impaction.  Given patient has recent small bowel obstruction, will get CT abdomen pelvis to further assess. Will get cbc, cmp, lipase as well.   5:18 AM Patient's initial lactate was 3.8 and improved to 1.3 after hydration.  Patient remains afebrile.  His LFTs were normal.  His CT showed increase peritoneal carcinomatosis with no obstruction.  Patient had some stool after enema.  I told patient that he needs to see his oncologist to discuss options regarding the worsening carcinomatosis.  Stable for discharge.  Final Clinical Impression(s) / ED Diagnoses Final diagnoses:  None    Rx / DC Orders ED Discharge Orders    None       Drenda Freeze, MD 02/13/20 [phone]

## 2020-02-13 NOTE — Discharge Instructions (Signed)
Your carcinomatosis became worse.  This is likely causing your fevers as well as constipation.  You may continue MiraLAX for constipation.  Please take Tylenol and Motrin for fever.  Please mention to your oncologist that your carcinomatosis is now worse.  Please discuss options with your oncologist.  Return to ER if you have worse abdominal pain, vomiting, dehydration.

## 2020-02-13 NOTE — ED Notes (Signed)
CRITICAL VALUE STICKER  CRITICAL VALUE: Lactic 3.8  RECEIVER (on-site recipient of call): Maylon Cos T RN  DATE & TIME NOTIFIED: 02/13/20 0154  MESSENGER (representative from lab): Sharee Pimple  MD NOTIFIED: Darl Householder  TIME OF NOTIFICATION: 0154  RESPONSE: see orders

## 2020-02-14 ENCOUNTER — Other Ambulatory Visit: Payer: Self-pay

## 2020-02-14 ENCOUNTER — Inpatient Hospital Stay: Payer: Medicare PPO | Attending: Oncology | Admitting: Nutrition

## 2020-02-14 ENCOUNTER — Other Ambulatory Visit: Payer: Self-pay | Admitting: Nurse Practitioner

## 2020-02-14 ENCOUNTER — Encounter: Payer: Self-pay | Admitting: General Practice

## 2020-02-14 ENCOUNTER — Telehealth: Payer: Self-pay

## 2020-02-14 DIAGNOSIS — Z7901 Long term (current) use of anticoagulants: Secondary | ICD-10-CM | POA: Insufficient documentation

## 2020-02-14 DIAGNOSIS — I10 Essential (primary) hypertension: Secondary | ICD-10-CM | POA: Insufficient documentation

## 2020-02-14 DIAGNOSIS — K5900 Constipation, unspecified: Secondary | ICD-10-CM | POA: Insufficient documentation

## 2020-02-14 DIAGNOSIS — E785 Hyperlipidemia, unspecified: Secondary | ICD-10-CM | POA: Insufficient documentation

## 2020-02-14 DIAGNOSIS — C169 Malignant neoplasm of stomach, unspecified: Secondary | ICD-10-CM | POA: Insufficient documentation

## 2020-02-14 DIAGNOSIS — R112 Nausea with vomiting, unspecified: Secondary | ICD-10-CM | POA: Insufficient documentation

## 2020-02-14 DIAGNOSIS — E7800 Pure hypercholesterolemia, unspecified: Secondary | ICD-10-CM | POA: Insufficient documentation

## 2020-02-14 DIAGNOSIS — C787 Secondary malignant neoplasm of liver and intrahepatic bile duct: Secondary | ICD-10-CM | POA: Insufficient documentation

## 2020-02-14 DIAGNOSIS — Z79899 Other long term (current) drug therapy: Secondary | ICD-10-CM | POA: Insufficient documentation

## 2020-02-14 DIAGNOSIS — Z5112 Encounter for antineoplastic immunotherapy: Secondary | ICD-10-CM | POA: Insufficient documentation

## 2020-02-14 DIAGNOSIS — C786 Secondary malignant neoplasm of retroperitoneum and peritoneum: Secondary | ICD-10-CM | POA: Insufficient documentation

## 2020-02-14 DIAGNOSIS — K219 Gastro-esophageal reflux disease without esophagitis: Secondary | ICD-10-CM | POA: Insufficient documentation

## 2020-02-14 DIAGNOSIS — Z8711 Personal history of peptic ulcer disease: Secondary | ICD-10-CM | POA: Insufficient documentation

## 2020-02-14 DIAGNOSIS — D649 Anemia, unspecified: Secondary | ICD-10-CM | POA: Insufficient documentation

## 2020-02-14 DIAGNOSIS — Z5111 Encounter for antineoplastic chemotherapy: Secondary | ICD-10-CM | POA: Insufficient documentation

## 2020-02-14 DIAGNOSIS — K311 Adult hypertrophic pyloric stenosis: Secondary | ICD-10-CM | POA: Insufficient documentation

## 2020-02-14 DIAGNOSIS — R5383 Other fatigue: Secondary | ICD-10-CM | POA: Insufficient documentation

## 2020-02-14 NOTE — Telephone Encounter (Addendum)
TC to Pt spoke with wife about Pt coming in for visit on 02/15/20 wife stated Pt went to the ER over the weekend for fever and nausea. She stated he was given IV fluids which he has been doing well since then. Pt's wife also asked if she could give Pt Benadryl stating he was bitten by a bee. Informed wife she can give Pt Benadryl. Pt also requesting handicap parking form. Informed her to ask when she comes with Pt for visit. Pt.'s wife verbalized all things discussed. No further problems or concerns noted.

## 2020-02-14 NOTE — Progress Notes (Signed)
74 year old male diagnosed with metastatic gastric cancer.  History of gastric outlet obstruction status post stent.  He is a patient of Dr. Benay Spice.    CURRENT THERAPY: FOLFOX with trastuzumab(q14 days)and pembrolizumab(q21 days), starting 01/31/20  Past medical history includes PUD, prediabetes, hypertension, hypercholesterolemia, and GERD.  Medications include fish oil, Zofran, Protonix, MiraLAX, and Compazine.  Labs include glucose 134 on August 2.  Height: 5 feet 7 inches. Weight: 170 pounds on August 2. Usual body weight: 200 pounds Nov 19, 2019. BMI: 26.63.  Patient states he is status post 1 treatment.  Reports tolerating soft food diet as long as he takes small bites and chews food well. Reports his nausea is controlled with medication. Reports history of eating with food coming back up but denies this currently.  He is status post stent placement He has been drinking alkaline water because someone told him it was better. Dietary recall reveals patient eats 2 eggs plus and Ensure compact or a smoothie at breakfast, cream soup with carrots and an Ensure or quitting at lunch and dinner.  He tries to eat ice cream later in the evening.  Reports generally he is drinking 2-3 oral nutrition supplements daily. Patient reports he has continued sensitivity to cold after oxaliplatin. Nutrition focused physical exam was deferred. Provider notes dramatic weight loss after cycle 1 largely due to improved ascites and lower extremity edema.  Nutrition diagnosis: Unintended weight loss related to metastatic gastric cancer as evidenced by 15% weight loss in less than 3 months which is significant.(? How much loss related to fluid changes.)  Intervention: Educated patient on the importance of consuming low fiber diet but using high-calorie, high-protein foods as tolerated. Encouraged high-calorie snacks. Recommended patient continue 2-3 cartons of oral nutrition supplements or smoothies  daily. Discussed efficacy and evidence based information related to alkaline water. I provided specific fact sheets.  Questions were answered.  Teach back method used.  Contact information given.  Monitoring, evaluation, goals: Patient will tolerate increased calories and protein to minimize further weight loss.  Next visit: Wednesday, August 11 during infusion.  **Disclaimer: This note was dictated with voice recognition software. Similar sounding words can inadvertently be transcribed and this note may contain transcription errors which may not have been corrected upon publication of note.**

## 2020-02-14 NOTE — Progress Notes (Signed)
[facility] CSW Progress Notes  Call from daughter Magdalene River [phone]) - she would like patient to get handicap placard.  SM sent to Dionne Milo RN to help w this need.  Also wants to apply for [employer] - daughter states she is now helping him handle his finances.  Message sent to [place] to connect w daughter to discuss grant specifics.  Edwyna Shell, LCSW Clinical Social Worker Phone:  [phone] Cell:  [phone]

## 2020-02-14 NOTE — Progress Notes (Signed)
[place]   Telephone:([phone] Fax:([phone]) [phone]   Clinic Follow up Note   Patient Care Team: Gaynelle Arabian, MD as PCP - General (Family Medicine) Jonnie Finner, RN as Oncology Nurse Navigator Ladell Pier, MD as Consulting Physician (Oncology) 02/15/2020  CHIEF COMPLAINT: Follow-up metastatic gastric cancer, ED follow-up  CURRENT THERAPY: FOLFOX with trastuzumab(q14 days)and pembrolizumab(q21 days), starting 01/31/20  INTERVAL HISTORY: Mr. Nardelli returns for follow-up as scheduled.  He was last seen by me on 7/28 for toxicity check.  He was doing well except cold sensitivity, fatigue, and constipation.  On 8/2 he presented to ED after an episode of increased abdominal pain and chills after taking a laxative at home.  T-max in ED was 100.8, CBC mildly elevated, lactic acid initially 3.8 but normalized after hydration.  Lipase slightly high 59.  UA was normal.  Blood cultures no growth to date.  Chest x-ray showed small right pleural effusion increased from prior study.  CT AP showed no bowel obstruction, but did mention mild interval progression of peritoneal carcinomatosis and stable liver metastases.  He was given IV fluids and an enema with small BM.  He was discharged home in stable condition.  Today, Mr. Nutting presents with his daughter for second cycle FOLFOX/herceptin.  He continues to have constipation, takes Senokot 1-2 times daily.  He had a BM last night after drinking warm lemon water and prune juice.  Abdominal pain is back to baseline, managed with 2 Tylenol at night.  Denies worsening abdominal bloating.  He vomited once after eating ice cream and fish together, no other nausea/vomiting.  Cold sensitivity resolved after more than a week, no residual neuropathy.  Denies cough, chest pain, shortness of breath, leg swelling or other new concerns.   MEDICAL HISTORY:  Past Medical History:  Diagnosis Date   ED (erectile dysfunction)    GERD  (gastroesophageal reflux disease)    High cholesterol    Hypertension    Hypogonadism male    Prediabetes    PUD (peptic ulcer disease)     SURGICAL HISTORY: Past Surgical History:  Procedure Laterality Date   FINGER AMPUTATION     TONSILLECTOMY      I have reviewed the social history and family history with the patient and they are unchanged from previous note.  ALLERGIES:  has No Known Allergies.  MEDICATIONS:  Current Outpatient Medications  Medication Sig Dispense Refill   atorvastatin (LIPITOR) 10 MG tablet Take 10 mg by mouth daily.     bisacodyl (DULCOLAX) 10 MG suppository Place 1 suppository (10 mg total) rectally as needed for moderate constipation. 12 suppository 0   ELIQUIS 5 MG TABS tablet Take 5 mg by mouth 2 (two) times daily.     FEROSUL 325 (65 Fe) MG tablet Take 325 mg by mouth daily.     HYDROcodone-acetaminophen (NORCO/VICODIN) 5-325 MG tablet Take 1 tablet by mouth every 6 (six) hours as needed for moderate pain.      lidocaine-prilocaine (EMLA) cream Apply 1 application topically as needed. 30 g 2   metoprolol succinate (TOPROL-XL) 25 MG 24 hr tablet Take by mouth.     Omega-3 Fatty Acids (FISH OIL) 1000 MG CAPS Take 2 capsules by mouth daily.     ondansetron (ZOFRAN) 4 MG tablet Take 4 mg by mouth every 8 (eight) hours as needed for nausea or vomiting.      pantoprazole (PROTONIX) 40 MG tablet Take 1 tablet (40 mg total) by mouth 2 (two)  times daily. 60 tablet 11   polyethylene glycol (MIRALAX / GLYCOLAX) 17 g packet Take 17 g by mouth daily as needed. 14 each 0   prochlorperazine (COMPAZINE) 10 MG tablet Take 1 tablet (10 mg total) by mouth every 6 (six) hours as needed for nausea or vomiting. 30 tablet 1   sennosides (SENOKOT) 8.8 MG/5ML syrup Take 10 mLs by mouth 2 (two) times daily. 240 mL 0   tamsulosin (FLOMAX) 0.4 MG CAPS capsule Take 0.4 mg by mouth daily.     potassium chloride SA (KLOR-CON) 20 MEQ tablet Take 1 tablet (20 mEq  total) by mouth daily for 14 doses. 14 tablet 0   No current facility-administered medications for this visit.   Facility-Administered Medications Ordered in Other Visits  Medication Dose Route Frequency Provider Last Rate Last Admin   0.9 %  sodium chloride infusion   Intravenous Continuous Ladell Pier, MD 20 mL/hr at 02/15/20 1256 New Bag at 02/15/20 1256   fluorouracil (ADRUCIL) 4,000 mg in sodium chloride 0.9 % 70 mL chemo infusion  2,000 mg/m2 (Treatment Plan Recorded) Intravenous 1 day or 1 dose Ladell Pier, MD       fluorouracil (ADRUCIL) chemo injection 600 mg  300 mg/m2 (Treatment Plan Recorded) Intravenous Once Ladell Pier, MD       heparin lock flush 100 unit/mL  500 Units Intracatheter Once PRN Ladell Pier, MD       leucovorin 604 mg in dextrose 5 % 250 mL infusion  300 mg/m2 (Treatment Plan Recorded) Intravenous Once Ladell Pier, MD 140 mL/hr at 02/15/20 1445 604 mg at 02/15/20 1445   oxaliplatin (ELOXATIN) 170 mg in dextrose 5 % 500 mL chemo infusion  85 mg/m2 (Treatment Plan Recorded) Intravenous Once Ladell Pier, MD 267 mL/hr at 02/15/20 1443 170 mg at 02/15/20 1443   sodium chloride flush (NS) 0.9 % injection 10 mL  10 mL Intracatheter PRN Ladell Pier, MD        PHYSICAL EXAMINATION:  Vitals:   02/15/20 1203  BP: (!) 150/75  Pulse: 92  Resp: 20  Temp: 98.1 F (36.7 C)  SpO2: 100%   Filed Weights   02/15/20 1203  Weight: 167 lb 4.8 oz (75.9 kg)    GENERAL:alert, no distress and comfortable SKIN: No rash to exposed skin EYES: sclera clear NECK: Without mass LYMPH:  no palpable cervical or supraclavicular lymphadenopathy LUNGS: clear with normal breathing effort HEART: regular rate & rhythm, no lower extremity edema ABDOMEN: abdomen soft, non-tender and normal bowel sounds. Fullness in the RUQ NEURO: alert & oriented x 3 with fluent speech PAC without erythema   LABORATORY DATA:  I have reviewed the data as listed CBC  Latest Ref Rng & Units 02/15/2020 02/13/2020 02/08/2020  WBC 4.0 - 10.5 K/uL 6.8 11.5(H) 7.6  Hemoglobin 13.0 - 17.0 g/dL 8.6(L) 9.4(L) 8.4(L)  Hematocrit 39 - 52 % 28.0(L) 31.1(L) 27.7(L)  Platelets 150 - 400 K/uL 287 384 409(H)     CMP Latest Ref Rng & Units 02/15/2020 02/13/2020 02/08/2020  Glucose 70 - 99 mg/dL 157(H) 134(H) 151(H)  BUN 8 - 23 mg/dL 7(L) 11 9  Creatinine 0.61 - 1.24 mg/dL 0.84 0.97 0.84  Sodium 135 - 145 mmol/L 138 137 136  Potassium 3.5 - 5.1 mmol/L 3.9 3.9 4.0  Chloride 98 - 111 mmol/L 103 100 102  CO2 22 - 32 mmol/L _0 Calcium 8.9 - 10.3 mg/dL 9.6 9.5 9.6  Total Protein  6.5 - 8.1 g/dL 7.0 7.7 6.9  Total Bilirubin 0.3 - 1.2 mg/dL 0.4 0.7 0.4  Alkaline Phos 38 - 126 U/L 106 115 117  AST 15 - 41 U/L 31 37 35  ALT 0 - 44 U/L _0 RADIOGRAPHIC STUDIES: I have personally reviewed the radiological images as listed and agreed with the findings in the report. No results found.   ASSESSMENT & PLAN:  1.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  CT abdomen/pelvis at [facility] 01/03/2020-masslike wall thickening of the gastric antrum, numerous liver metastases, small volume ascites, numerous peritoneal nodules, enlarged periaortic and porta hepatis lymph nodes  Upper endoscopy 01/06/2020-large ulcer in the gastric antrum with heaped up and firm edges, Duodenal bulb largely "obliterated "by the mass, biopsy confirmed adenocarcinoma, HER-2 3+, intact mismatch repair protein expression, PD-L1: 4   CT chest 01/10/2020-acute bilateral pulmonary emboli, no lymphadenopathy or suspicious lung nodules  Baseline echo 01/17/20 at [place], EF 60-65%  CT abdomen/pelvis 01/22/2020, gastric mass, liver metastases, peritoneal/omental nodules, ascites, right pleural effusion  Therapeutic and diagnostic paracentesis on 01/27/2020, 2.1 L removed;cytology shows malignant cells,malignant ascites  PD-L1 combined positive score: 1 (from ascites/periteoneal  fluid), reported 02/01/20   Cycle 1 FOLFOX with trastuzumab and pembrolizumab, 01/31/2020  Baseline CEA 13.6 (02/08/2020)  Cycle 2 FOLFOX/trastuzumab 02/15/20   2.Nausea/vomiting secondary to #1 with gastric outlet obstruction  Gastric stent placement 01/12/2020, n/v resolved  3.Acute bilateral lower lobe pulmonary emboli on CT 01/10/2020-treated with apixaban,(givenLovenoxduring hospitalization)  4.Anemia-likely secondary to bleeding from the gastric mass  5.Status post Port-A-Cath placement 01/13/2020  6.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 Hurrell appears stable. He completed 1 cycle of FOLFOX/Trastuzumab, and pembrolizumab. He tolerated mostly well with fatigue, cold sensitivity, and constipation. He was able to function and recover well. A disability placard form was completed and returned to him today per his request.  He developed an episode of chills and acute abdominal pain after taking laxative on 8/2, he was afebrile. ID workup in the ED was negative. CT showed no bowel obstruction, gastric stent in place. He had BM after enema. Abdominal pain returned to baseline. I recommend to continue senokot and bowel regimen to maintain BM q1-2 days. He prefers lemon water/prune juice over laxative. He will monitor for longer periods of constipation, acute abdominal pain, n/v which would be signs of obstruction.   I reviewed the PD-L1 result from initial biopsy on 01/06/20 shows combined positive score of 4. CBC and CMP are adequate to proceed with  cycle 2 FOLFOX/trastuzumab today. He will return for f/u and pembrolizumab in 1 week.    Orders Placed This Encounter  Procedures   CBC with Differential ([facility] Only)    Standing Status:   Future    Standing Expiration Date:   02/14/2021   CMP ([facility] only)    Standing Status:   Future    Standing Expiration Date:   02/14/2021   TSH    Standing Status:   Future    Standing Expiration Date:   02/14/2021   All questions were answered. The patient knows to call the  clinic with any problems, questions or concerns. No barriers to learning were detected. Total encounter time was 30 minutes.      Alla Feeling, NP 02/15/20

## 2020-02-15 ENCOUNTER — Inpatient Hospital Stay: Payer: Medicare PPO

## 2020-02-15 ENCOUNTER — Encounter: Payer: Self-pay | Admitting: Nurse Practitioner

## 2020-02-15 ENCOUNTER — Encounter: Payer: Self-pay | Admitting: Oncology

## 2020-02-15 ENCOUNTER — Other Ambulatory Visit: Payer: Self-pay | Admitting: Oncology

## 2020-02-15 ENCOUNTER — Other Ambulatory Visit: Payer: Self-pay

## 2020-02-15 ENCOUNTER — Inpatient Hospital Stay (HOSPITAL_BASED_OUTPATIENT_CLINIC_OR_DEPARTMENT_OTHER): Payer: Medicare PPO | Admitting: Nurse Practitioner

## 2020-02-15 VITALS — BP 150/75 | HR 92 | Temp 98.1°F | Resp 20 | Ht 67.0 in | Wt 167.3 lb

## 2020-02-15 DIAGNOSIS — R5383 Other fatigue: Secondary | ICD-10-CM | POA: Diagnosis not present

## 2020-02-15 DIAGNOSIS — Z79899 Other long term (current) drug therapy: Secondary | ICD-10-CM | POA: Diagnosis not present

## 2020-02-15 DIAGNOSIS — Z95828 Presence of other vascular implants and grafts: Secondary | ICD-10-CM

## 2020-02-15 DIAGNOSIS — Z5112 Encounter for antineoplastic immunotherapy: Secondary | ICD-10-CM | POA: Diagnosis not present

## 2020-02-15 DIAGNOSIS — Z5111 Encounter for antineoplastic chemotherapy: Secondary | ICD-10-CM | POA: Diagnosis not present

## 2020-02-15 DIAGNOSIS — C169 Malignant neoplasm of stomach, unspecified: Secondary | ICD-10-CM

## 2020-02-15 DIAGNOSIS — E7800 Pure hypercholesterolemia, unspecified: Secondary | ICD-10-CM | POA: Diagnosis not present

## 2020-02-15 DIAGNOSIS — I10 Essential (primary) hypertension: Secondary | ICD-10-CM | POA: Diagnosis not present

## 2020-02-15 DIAGNOSIS — C787 Secondary malignant neoplasm of liver and intrahepatic bile duct: Secondary | ICD-10-CM | POA: Diagnosis not present

## 2020-02-15 DIAGNOSIS — E785 Hyperlipidemia, unspecified: Secondary | ICD-10-CM | POA: Diagnosis not present

## 2020-02-15 DIAGNOSIS — D649 Anemia, unspecified: Secondary | ICD-10-CM | POA: Diagnosis not present

## 2020-02-15 DIAGNOSIS — C786 Secondary malignant neoplasm of retroperitoneum and peritoneum: Secondary | ICD-10-CM | POA: Diagnosis not present

## 2020-02-15 DIAGNOSIS — Z8711 Personal history of peptic ulcer disease: Secondary | ICD-10-CM | POA: Diagnosis not present

## 2020-02-15 DIAGNOSIS — K219 Gastro-esophageal reflux disease without esophagitis: Secondary | ICD-10-CM | POA: Diagnosis not present

## 2020-02-15 DIAGNOSIS — R112 Nausea with vomiting, unspecified: Secondary | ICD-10-CM | POA: Diagnosis not present

## 2020-02-15 DIAGNOSIS — C186 Malignant neoplasm of descending colon: Secondary | ICD-10-CM | POA: Diagnosis not present

## 2020-02-15 DIAGNOSIS — Z7901 Long term (current) use of anticoagulants: Secondary | ICD-10-CM | POA: Diagnosis not present

## 2020-02-15 DIAGNOSIS — K311 Adult hypertrophic pyloric stenosis: Secondary | ICD-10-CM | POA: Diagnosis not present

## 2020-02-15 DIAGNOSIS — K5900 Constipation, unspecified: Secondary | ICD-10-CM | POA: Diagnosis not present

## 2020-02-15 LAB — CMP (CANCER CENTER ONLY)
ALT: 16 U/L (ref 0–44)
AST: 31 U/L (ref 15–41)
Albumin: 3.2 g/dL — ABNORMAL LOW (ref 3.5–5.0)
Alkaline Phosphatase: 106 U/L (ref 38–126)
Anion gap: 9 (ref 5–15)
BUN: 7 mg/dL — ABNORMAL LOW (ref 8–23)
CO2: 26 mmol/L (ref 22–32)
Calcium: 9.6 mg/dL (ref 8.9–10.3)
Chloride: 103 mmol/L (ref 98–111)
Creatinine: 0.84 mg/dL (ref 0.61–1.24)
GFR, Est AFR Am: >60 mL/min (ref >60)
GFR, Estimated: >60 mL/min (ref >60)
Glucose, Bld: 157 mg/dL — ABNORMAL HIGH (ref 70–99)
Potassium: 3.9 mmol/L (ref 3.5–5.1)
Sodium: 138 mmol/L (ref 135–145)
Total Bilirubin: 0.4 mg/dL (ref 0.3–1.2)
Total Protein: 7 g/dL (ref 6.5–8.1)

## 2020-02-15 LAB — CBC WITH DIFFERENTIAL (CANCER CENTER ONLY)
Abs Immature Granulocytes: 0.02 10*3/uL (ref 0.00–0.07)
Basophils Absolute: 0 10*3/uL (ref 0.0–0.1)
Basophils Relative: 0 %
Eosinophils Absolute: 0 10*3/uL (ref 0.0–0.5)
Eosinophils Relative: 1 %
HCT: 28 % — ABNORMAL LOW (ref 39.0–52.0)
Hemoglobin: 8.6 g/dL — ABNORMAL LOW (ref 13.0–17.0)
Immature Granulocytes: 0 %
Lymphocytes Relative: 8 %
Lymphs Abs: 0.5 10*3/uL — ABNORMAL LOW (ref 0.7–4.0)
MCH: 26.6 pg (ref 26.0–34.0)
MCHC: 30.7 g/dL (ref 30.0–36.0)
MCV: 86.7 fL (ref 80.0–100.0)
Monocytes Absolute: 0.5 10*3/uL (ref 0.1–1.0)
Monocytes Relative: 7 %
Neutro Abs: 5.7 10*3/uL (ref 1.7–7.7)
Neutrophils Relative %: 84 %
Platelet Count: 287 10*3/uL (ref 150–400)
RBC: 3.23 MIL/uL — ABNORMAL LOW (ref 4.22–5.81)
RDW: 17.1 % — ABNORMAL HIGH (ref 11.5–15.5)
WBC Count: 6.8 10*3/uL (ref 4.0–10.5)
nRBC: 0 % (ref 0.0–0.2)

## 2020-02-15 MED ORDER — DIPHENHYDRAMINE HCL 25 MG PO CAPS
ORAL_CAPSULE | ORAL | Status: AC
  Filled 2020-02-15: qty 2

## 2020-02-15 MED ORDER — LEUCOVORIN CALCIUM INJECTION 350 MG
300.0000 mg/m2 | Freq: Once | INTRAVENOUS | Status: AC
  Administered 2020-02-15: 604 mg via INTRAVENOUS
  Filled 2020-02-15: qty 30.2

## 2020-02-15 MED ORDER — SODIUM CHLORIDE 0.9% FLUSH
10.0000 mL | Freq: Once | INTRAVENOUS | Status: AC
  Administered 2020-02-15: 10 mL
  Filled 2020-02-15: qty 10

## 2020-02-15 MED ORDER — OXALIPLATIN CHEMO INJECTION 100 MG/20ML
85.0000 mg/m2 | Freq: Once | INTRAVENOUS | Status: AC
  Administered 2020-02-15: 170 mg via INTRAVENOUS
  Filled 2020-02-15: qty 34

## 2020-02-15 MED ORDER — PALONOSETRON HCL INJECTION 0.25 MG/5ML
0.2500 mg | Freq: Once | INTRAVENOUS | Status: AC
  Administered 2020-02-15: 0.25 mg via INTRAVENOUS

## 2020-02-15 MED ORDER — SODIUM CHLORIDE 0.9 % IV SOLN
INTRAVENOUS | Status: DC
  Filled 2020-02-15: qty 250

## 2020-02-15 MED ORDER — DIPHENHYDRAMINE HCL 25 MG PO CAPS
50.0000 mg | ORAL_CAPSULE | Freq: Once | ORAL | Status: AC
  Administered 2020-02-15: 50 mg via ORAL

## 2020-02-15 MED ORDER — DEXTROSE 5 % IV SOLN
Freq: Once | INTRAVENOUS | Status: AC
  Filled 2020-02-15: qty 250

## 2020-02-15 MED ORDER — HEPARIN SOD (PORK) LOCK FLUSH 100 UNIT/ML IV SOLN
500.0000 [IU] | Freq: Once | INTRAVENOUS | Status: DC | PRN
  Filled 2020-02-15: qty 5

## 2020-02-15 MED ORDER — ACETAMINOPHEN 325 MG PO TABS
ORAL_TABLET | ORAL | Status: AC
  Filled 2020-02-15: qty 2

## 2020-02-15 MED ORDER — PALONOSETRON HCL INJECTION 0.25 MG/5ML
INTRAVENOUS | Status: AC
  Filled 2020-02-15: qty 5

## 2020-02-15 MED ORDER — DIPHENHYDRAMINE HCL 50 MG/ML IJ SOLN
INTRAMUSCULAR | Status: AC
  Filled 2020-02-15: qty 1

## 2020-02-15 MED ORDER — FLUOROURACIL CHEMO INJECTION 2.5 GM/50ML
300.0000 mg/m2 | Freq: Once | INTRAVENOUS | Status: AC
  Administered 2020-02-15: 600 mg via INTRAVENOUS
  Filled 2020-02-15: qty 12

## 2020-02-15 MED ORDER — SODIUM CHLORIDE 0.9 % IV SOLN
10.0000 mg | Freq: Once | INTRAVENOUS | Status: AC
  Administered 2020-02-15: 10 mg via INTRAVENOUS
  Filled 2020-02-15: qty 10

## 2020-02-15 MED ORDER — SODIUM CHLORIDE 0.9% FLUSH
10.0000 mL | INTRAVENOUS | Status: DC | PRN
  Filled 2020-02-15: qty 10

## 2020-02-15 MED ORDER — TRASTUZUMAB-ANNS CHEMO 150 MG IV SOLR
300.0000 mg | Freq: Once | INTRAVENOUS | Status: AC
  Administered 2020-02-15: 300 mg via INTRAVENOUS
  Filled 2020-02-15: qty 14.29

## 2020-02-15 MED ORDER — SODIUM CHLORIDE 0.9 % IV SOLN
2000.0000 mg/m2 | INTRAVENOUS | Status: DC
  Administered 2020-02-15: 4000 mg via INTRAVENOUS
  Filled 2020-02-15: qty 80

## 2020-02-15 MED ORDER — ACETAMINOPHEN 325 MG PO TABS
650.0000 mg | ORAL_TABLET | Freq: Once | ORAL | Status: AC
  Administered 2020-02-15: 650 mg via ORAL

## 2020-02-15 NOTE — Progress Notes (Signed)
Met with patient and daughter in lobby whom had some questions regarding the [employer].  Introduced myself as Arboriculturist and to explain details regarding Environmental health practitioner and acceptable expenses.  She has my card for any additional financial questions or concerns.

## 2020-02-15 NOTE — Progress Notes (Signed)
Medication list adjust several duplicates listed

## 2020-02-15 NOTE — Patient Instructions (Signed)
[facility] Discharge Instructions for Patients Receiving Chemotherapy  Today you received the following chemotherapy agents: pembrolizumab, trastuzumab, oxaliplatin, leucovorin, fluorouracil.   To help prevent nausea and vomiting after your treatment, we encourage you to take your nausea medication as prescribed by your physician.    If you develop nausea and vomiting that is not controlled by your nausea medication, call the clinic.   BELOW ARE SYMPTOMS THAT SHOULD BE REPORTED IMMEDIATELY:  *FEVER GREATER THAN 100.5 F  *CHILLS WITH OR WITHOUT FEVER  NAUSEA AND VOMITING THAT IS NOT CONTROLLED WITH YOUR NAUSEA MEDICATION  *UNUSUAL SHORTNESS OF BREATH  *UNUSUAL BRUISING OR BLEEDING  TENDERNESS IN MOUTH AND THROAT WITH OR WITHOUT PRESENCE OF ULCERS  *URINARY PROBLEMS  *BOWEL PROBLEMS  UNUSUAL RASH Items with * indicate a potential emergency and should be followed up as soon as possible.  Feel free to call the clinic should you have any questions or concerns. The clinic phone number is [phone].  Please show the CHEMO ALERT CARD at check-in to the Emergency Department and triage nurse.  Pembrolizumab injection What is this medicine? PEMBROLIZUMAB (pem broe liz ue mab) is a monoclonal antibody. It is used to treat certain types of cancer. This medicine may be used for other purposes; ask your health care provider or pharmacist if you have questions. COMMON BRAND NAME(S): Keytruda What should I tell my health care provider before I take this medicine? They need to know if you have any of these conditions:  diabetes  immune system problems  inflammatory bowel disease  liver disease  lung or breathing disease  lupus  received or scheduled to receive an organ transplant or a stem-cell transplant that uses donor stem cells  an unusual or allergic reaction to pembrolizumab, other medicines, foods, dyes, or preservatives  pregnant or trying to get  pregnant  breast-feeding How should I use this medicine? This medicine is for infusion into a vein. It is given by a health care professional in a hospital or clinic setting. A special MedGuide will be given to you before each treatment. Be sure to read this information carefully each time. Talk to your pediatrician regarding the use of this medicine in children. While this drug may be prescribed for children as young as 6 months for selected conditions, precautions do apply. Overdosage: If you think you have taken too much of this medicine contact a poison control center or emergency room at once. NOTE: This medicine is only for you. Do not share this medicine with others. What if I miss a dose? It is important not to miss your dose. Call your doctor or health care professional if you are unable to keep an appointment. What may interact with this medicine? Interactions have not been studied. Give your health care provider a list of all the medicines, herbs, non-prescription drugs, or dietary supplements you use. Also tell them if you smoke, drink alcohol, or use illegal drugs. Some items may interact with your medicine. This list may not describe all possible interactions. Give your health care provider a list of all the medicines, herbs, non-prescription drugs, or dietary supplements you use. Also tell them if you smoke, drink alcohol, or use illegal drugs. Some items may interact with your medicine. What should I watch for while using this medicine? Your condition will be monitored carefully while you are receiving this medicine. You may need blood work done while you are taking this medicine. Do not become pregnant while taking this medicine   or for 4 months after stopping it. Women should inform their doctor if they wish to become pregnant or think they might be pregnant. There is a potential for serious side effects to an unborn child. Talk to your health care professional or pharmacist for  more information. Do not breast-feed an infant while taking this medicine or for 4 months after the last dose. What side effects may I notice from receiving this medicine? Side effects that you should report to your doctor or health care professional as soon as possible:  allergic reactions like skin rash, itching or hives, swelling of the face, lips, or tongue  bloody or black, tarry  breathing problems  changes in vision  chest pain  chills  confusion  constipation  cough  diarrhea  dizziness or feeling faint or lightheaded  fast or irregular heartbeat  fever  flushing  joint pain  low blood counts - this medicine may decrease the number of white blood cells, red blood cells and platelets. You may be at increased risk for infections and bleeding.  muscle pain  muscle weakness  pain, tingling, numbness in the hands or feet  persistent headache  redness, blistering, peeling or loosening of the skin, including inside the mouth  signs and symptoms of high blood sugar such as dizziness; dry mouth; dry skin; fruity breath; nausea; stomach pain; increased hunger or thirst; increased urination  signs and symptoms of kidney injury like trouble passing urine or change in the amount of urine  signs and symptoms of liver injury like dark urine, light-colored stools, loss of appetite, nausea, right upper belly pain, yellowing of the eyes or skin  sweating  swollen lymph nodes  weight loss Side effects that usually do not require medical attention (report to your doctor or health care professional if they continue or are bothersome):  decreased appetite  hair loss  muscle pain  tiredness This list may not describe all possible side effects. Call your doctor for medical advice about side effects. You may report side effects to FDA at 1-800-FDA-1088. Where should I keep my medicine? This drug is given in a hospital or clinic and will not be stored at home. NOTE:  This sheet is a summary. It may not cover all possible information. If you have questions about this medicine, talk to your doctor, pharmacist, or health care provider.  2020 Elsevier/Gold Standard (2019-05-06 18:07:58)  Trastuzumab injection for infusion What is this medicine? TRASTUZUMAB (tras TOO zoo mab) is a monoclonal antibody. It is used to treat breast cancer and stomach cancer. This medicine may be used for other purposes; ask your health care provider or pharmacist if you have questions. COMMON BRAND NAME(S): Herceptin, Herzuma, KANJINTI, Ogivri, Ontruzant, Trazimera What should I tell my health care provider before I take this medicine? They need to know if you have any of these conditions:  heart disease  heart failure  lung or breathing disease, like asthma  an unusual or allergic reaction to trastuzumab, benzyl alcohol, or other medications, foods, dyes, or preservatives  pregnant or trying to get pregnant  breast-feeding How should I use this medicine? This drug is given as an infusion into a vein. It is administered in a hospital or clinic by a specially trained health care professional. Talk to your pediatrician regarding the use of this medicine in children. This medicine is not approved for use in children. Overdosage: If you think you have taken too much of this medicine contact a poison control center or   emergency room at once. NOTE: This medicine is only for you. Do not share this medicine with others. What if I miss a dose? It is important not to miss a dose. Call your doctor or health care professional if you are unable to keep an appointment. What may interact with this medicine? This medicine may interact with the following medications:  certain types of chemotherapy, such as daunorubicin, doxorubicin, epirubicin, and idarubicin This list may not describe all possible interactions. Give your health care provider a list of all the medicines, herbs,  non-prescription drugs, or dietary supplements you use. Also tell them if you smoke, drink alcohol, or use illegal drugs. Some items may interact with your medicine. What should I watch for while using this medicine? Visit your doctor for checks on your progress. Report any side effects. Continue your course of treatment even though you feel ill unless your doctor tells you to stop. Call your doctor or health care professional for advice if you get a fever, chills or sore throat, or other symptoms of a cold or flu. Do not treat yourself. Try to avoid being around people who are sick. You may experience fever, chills and shaking during your first infusion. These effects are usually mild and can be treated with other medicines. Report any side effects during the infusion to your health care professional. Fever and chills usually do not happen with later infusions. Do not become pregnant while taking this medicine or for 7 months after stopping it. Women should inform their doctor if they wish to become pregnant or think they might be pregnant. Women of child-bearing potential will need to have a negative pregnancy test before starting this medicine. There is a potential for serious side effects to an unborn child. Talk to your health care professional or pharmacist for more information. Do not breast-feed an infant while taking this medicine or for 7 months after stopping it. Women must use effective birth control with this medicine. What side effects may I notice from receiving this medicine? Side effects that you should report to your doctor or health care professional as soon as possible:  allergic reactions like skin rash, itching or hives, swelling of the face, lips, or tongue  chest pain or palpitations  cough  dizziness  feeling faint or lightheaded, falls  fever  general ill feeling or flu-like symptoms  signs of worsening heart failure like breathing problems; swelling in your legs and  feet  unusually weak or tired Side effects that usually do not require medical attention (report to your doctor or health care professional if they continue or are bothersome):  bone pain  changes in taste  diarrhea  joint pain  nausea/vomiting  weight loss This list may not describe all possible side effects. Call your doctor for medical advice about side effects. You may report side effects to FDA at 1-800-FDA-1088. Where should I keep my medicine? This drug is given in a hospital or clinic and will not be stored at home. NOTE: This sheet is a summary. It may not cover all possible information. If you have questions about this medicine, talk to your doctor, pharmacist, or health care provider.  2020 Elsevier/Gold Standard (2016-06-24 14:37:52)  Oxaliplatin Injection What is this medicine? OXALIPLATIN (ox AL i PLA tin) is a chemotherapy drug. It targets fast dividing cells, like cancer cells, and causes these cells to die. This medicine is used to treat cancers of the colon and rectum, and many other cancers. This medicine may   be used for other purposes; ask your health care provider or pharmacist if you have questions. COMMON BRAND NAME(S): Eloxatin What should I tell my health care provider before I take this medicine? They need to know if you have any of these conditions:  heart disease  history of irregular heartbeat  liver disease  low blood counts, like white cells, platelets, or red blood cells  lung or breathing disease, like asthma  take medicines that treat or prevent blood clots  tingling of the fingers or toes, or other nerve disorder  an unusual or allergic reaction to oxaliplatin, other chemotherapy, other medicines, foods, dyes, or preservatives  pregnant or trying to get pregnant  breast-feeding How should I use this medicine? This drug is given as an infusion into a vein. It is administered in a hospital or clinic by a specially trained health care  professional. Talk to your pediatrician regarding the use of this medicine in children. Special care may be needed. Overdosage: If you think you have taken too much of this medicine contact a poison control center or emergency room at once. NOTE: This medicine is only for you. Do not share this medicine with others. What if I miss a dose? It is important not to miss a dose. Call your doctor or health care professional if you are unable to keep an appointment. What may interact with this medicine? Do not take this medicine with any of the following medications:  cisapride  dronedarone  pimozide  thioridazine This medicine may also interact with the following medications:  aspirin and aspirin-like medicines  certain medicines that treat or prevent blood clots like warfarin, apixaban, dabigatran, and rivaroxaban  cisplatin  cyclosporine  diuretics  medicines for infection like acyclovir, adefovir, amphotericin B, bacitracin, cidofovir, foscarnet, ganciclovir, gentamicin, pentamidine, vancomycin  NSAIDs, medicines for pain and inflammation, like ibuprofen or naproxen  other medicines that prolong the QT interval (an abnormal heart rhythm)  pamidronate  zoledronic acid This list may not describe all possible interactions. Give your health care provider a list of all the medicines, herbs, non-prescription drugs, or dietary supplements you use. Also tell them if you smoke, drink alcohol, or use illegal drugs. Some items may interact with your medicine. What should I watch for while using this medicine? Your condition will be monitored carefully while you are receiving this medicine. You may need blood work done while you are taking this medicine. This medicine may make you feel generally unwell. This is not uncommon as chemotherapy can affect healthy cells as well as cancer cells. Report any side effects. Continue your course of treatment even though you feel ill unless your  healthcare professional tells you to stop. This medicine can make you more sensitive to cold. Do not drink cold drinks or use ice. Cover exposed skin before coming in contact with cold temperatures or cold objects. When out in cold weather wear warm clothing and cover your mouth and nose to warm the air that goes into your lungs. Tell your doctor if you get sensitive to the cold. Do not become pregnant while taking this medicine or for 9 months after stopping it. Women should inform their health care professional if they wish to become pregnant or think they might be pregnant. Men should not father a child while taking this medicine and for 6 months after stopping it. There is potential for serious side effects to an unborn child. Talk to your health care professional for more information. Do not breast-feed a   child while taking this medicine or for 3 months after stopping it. This medicine has caused ovarian failure in some women. This medicine may make it more difficult to get pregnant. Talk to your health care professional if you are concerned about your fertility. This medicine has caused decreased sperm counts in some men. This may make it more difficult to father a child. Talk to your health care professional if you are concerned about your fertility. This medicine may increase your risk of getting an infection. Call your health care professional for advice if you get a fever, chills, or sore throat, or other symptoms of a cold or flu. Do not treat yourself. Try to avoid being around people who are sick. Avoid taking medicines that contain aspirin, acetaminophen, ibuprofen, naproxen, or ketoprofen unless instructed by your health care professional. These medicines may hide a fever. Be careful brushing or flossing your teeth or using a toothpick because you may get an infection or bleed more easily. If you have any dental work done, tell your dentist you are receiving this medicine. What side effects  may I notice from receiving this medicine? Side effects that you should report to your doctor or health care professional as soon as possible:  allergic reactions like skin rash, itching or hives, swelling of the face, lips, or tongue  breathing problems  cough  low blood counts - this medicine may decrease the number of white blood cells, red blood cells, and platelets. You may be at increased risk for infections and bleeding  nausea, vomiting  pain, redness, or irritation at site where injected  pain, tingling, numbness in the hands or feet  signs and symptoms of bleeding such as bloody or black, tarry stools; red or dark brown urine; spitting up blood or brown material that looks like coffee grounds; red spots on the skin; unusual bruising or bleeding from the eyes, gums, or nose  signs and symptoms of a dangerous change in heartbeat or heart rhythm like chest pain; dizziness; fast, irregular heartbeat; palpitations; feeling faint or lightheaded; falls  signs and symptoms of infection like fever; chills; cough; sore throat; pain or trouble passing urine  signs and symptoms of liver injury like dark yellow or brown urine; general ill feeling or flu-like symptoms; light-colored stools; loss of appetite; nausea; right upper belly pain; unusually weak or tired; yellowing of the eyes or skin  signs and symptoms of low red blood cells or anemia such as unusually weak or tired; feeling faint or lightheaded; falls  signs and symptoms of muscle injury like dark urine; trouble passing urine or change in the amount of urine; unusually weak or tired; muscle pain; back pain Side effects that usually do not require medical attention (report to your doctor or health care professional if they continue or are bothersome):  changes in taste  diarrhea  gas  hair loss  loss of appetite  mouth sores This list may not describe all possible side effects. Call your doctor for medical advice about  side effects. You may report side effects to FDA at 1-800-FDA-1088. Where should I keep my medicine? This drug is given in a hospital or clinic and will not be stored at home. NOTE: This sheet is a summary. It may not cover all possible information. If you have questions about this medicine, talk to your doctor, pharmacist, or health care provider.  2020 Elsevier/Gold Standard (2018-11-17 12:20:35)  Leucovorin injection What is this medicine? LEUCOVORIN (loo koe VOR in) is used   to prevent or treat the harmful effects of some medicines. This medicine is used to treat anemia caused by a low amount of folic acid in the body. It is also used with 5-fluorouracil (5-FU) to treat colon cancer. This medicine may be used for other purposes; ask your health care provider or pharmacist if you have questions. What should I tell my health care provider before I take this medicine? They need to know if you have any of these conditions:  anemia from low levels of vitamin B-12 in the blood  an unusual or allergic reaction to leucovorin, folic acid, other medicines, foods, dyes, or preservatives  pregnant or trying to get pregnant  breast-feeding How should I use this medicine? This medicine is for injection into a muscle or into a vein. It is given by a health care professional in a hospital or clinic setting. Talk to your pediatrician regarding the use of this medicine in children. Special care may be needed. Overdosage: If you think you have taken too much of this medicine contact a poison control center or emergency room at once. NOTE: This medicine is only for you. Do not share this medicine with others. What if I miss a dose? This does not apply. What may interact with this medicine?  capecitabine  fluorouracil  phenobarbital  phenytoin  primidone  trimethoprim-sulfamethoxazole This list may not describe all possible interactions. Give your health care provider a list of all the  medicines, herbs, non-prescription drugs, or dietary supplements you use. Also tell them if you smoke, drink alcohol, or use illegal drugs. Some items may interact with your medicine. What should I watch for while using this medicine? Your condition will be monitored carefully while you are receiving this medicine. This medicine may increase the side effects of 5-fluorouracil, 5-FU. Tell your doctor or health care professional if you have diarrhea or mouth sores that do not get better or that get worse. What side effects may I notice from receiving this medicine? Side effects that you should report to your doctor or health care professional as soon as possible:  allergic reactions like skin rash, itching or hives, swelling of the face, lips, or tongue  breathing problems  fever, infection  mouth sores  unusual bleeding or bruising  unusually weak or tired Side effects that usually do not require medical attention (report to your doctor or health care professional if they continue or are bothersome):  constipation or diarrhea  loss of appetite  nausea, vomiting This list may not describe all possible side effects. Call your doctor for medical advice about side effects. You may report side effects to FDA at 1-800-FDA-1088. Where should I keep my medicine? This drug is given in a hospital or clinic and will not be stored at home. NOTE: This sheet is a summary. It may not cover all possible information. If you have questions about this medicine, talk to your doctor, pharmacist, or health care provider.  2020 Elsevier/Gold Standard (2008-01-04 16:50:29)  Fluorouracil, 5-FU injection What is this medicine? FLUOROURACIL, 5-FU (flure oh YOOR a sil) is a chemotherapy drug. It slows the growth of cancer cells. This medicine is used to treat many types of cancer like breast cancer, colon or rectal cancer, pancreatic cancer, and stomach cancer. This medicine may be used for other purposes; ask  your health care provider or pharmacist if you have questions. COMMON BRAND NAME(S): Adrucil What should I tell my health care provider before I take this medicine? They need to   know if you have any of these conditions:  blood disorders  dihydropyrimidine dehydrogenase (DPD) deficiency  infection (especially a virus infection such as chickenpox, cold sores, or herpes)  kidney disease  liver disease  malnourished, poor nutrition  recent or ongoing radiation therapy  an unusual or allergic reaction to fluorouracil, other chemotherapy, other medicines, foods, dyes, or preservatives  pregnant or trying to get pregnant  breast-feeding How should I use this medicine? This drug is given as an infusion or injection into a vein. It is administered in a hospital or clinic by a specially trained health care professional. Talk to your pediatrician regarding the use of this medicine in children. Special care may be needed. Overdosage: If you think you have taken too much of this medicine contact a poison control center or emergency room at once. NOTE: This medicine is only for you. Do not share this medicine with others. What if I miss a dose? It is important not to miss your dose. Call your doctor or health care professional if you are unable to keep an appointment. What may interact with this medicine?  allopurinol  cimetidine  dapsone  digoxin  hydroxyurea  leucovorin  levamisole  medicines for seizures like ethotoin, fosphenytoin, phenytoin  medicines to increase blood counts like filgrastim, pegfilgrastim, sargramostim  medicines that treat or prevent blood clots like warfarin, enoxaparin, and dalteparin  methotrexate  metronidazole  pyrimethamine  some other chemotherapy drugs like busulfan, cisplatin, estramustine, vinblastine  trimethoprim  trimetrexate  vaccines Talk to your doctor or health care professional before taking any of these  medicines:  acetaminophen  aspirin  ibuprofen  ketoprofen  naproxen This list may not describe all possible interactions. Give your health care provider a list of all the medicines, herbs, non-prescription drugs, or dietary supplements you use. Also tell them if you smoke, drink alcohol, or use illegal drugs. Some items may interact with your medicine. What should I watch for while using this medicine? Visit your doctor for checks on your progress. This drug may make you feel generally unwell. This is not uncommon, as chemotherapy can affect healthy cells as well as cancer cells. Report any side effects. Continue your course of treatment even though you feel ill unless your doctor tells you to stop. In some cases, you may be given additional medicines to help with side effects. Follow all directions for their use. Call your doctor or health care professional for advice if you get a fever, chills or sore throat, or other symptoms of a cold or flu. Do not treat yourself. This drug decreases your body's ability to fight infections. Try to avoid being around people who are sick. This medicine may increase your risk to bruise or bleed. Call your doctor or health care professional if you notice any unusual bleeding. Be careful brushing and flossing your teeth or using a toothpick because you may get an infection or bleed more easily. If you have any dental work done, tell your dentist you are receiving this medicine. Avoid taking products that contain aspirin, acetaminophen, ibuprofen, naproxen, or ketoprofen unless instructed by your doctor. These medicines may hide a fever. Do not become pregnant while taking this medicine. Women should inform their doctor if they wish to become pregnant or think they might be pregnant. There is a potential for serious side effects to an unborn child. Talk to your health care professional or pharmacist for more information. Do not breast-feed an infant while taking  this medicine. Men should inform   their doctor if they wish to father a child. This medicine may lower sperm counts. Do not treat diarrhea with over the counter products. Contact your doctor if you have diarrhea that lasts more than 2 days or if it is severe and watery. This medicine can make you more sensitive to the sun. Keep out of the sun. If you cannot avoid being in the sun, wear protective clothing and use sunscreen. Do not use sun lamps or tanning beds/booths. What side effects may I notice from receiving this medicine? Side effects that you should report to your doctor or health care professional as soon as possible:  allergic reactions like skin rash, itching or hives, swelling of the face, lips, or tongue  low blood counts - this medicine may decrease the number of white blood cells, red blood cells and platelets. You may be at increased risk for infections and bleeding.  signs of infection - fever or chills, cough, sore throat, pain or difficulty passing urine  signs of decreased platelets or bleeding - bruising, pinpoint red spots on the skin, black, tarry stools, blood in the urine  signs of decreased red blood cells - unusually weak or tired, fainting spells, lightheadedness  breathing problems  changes in vision  chest pain  mouth sores  nausea and vomiting  pain, swelling, redness at site where injected  pain, tingling, numbness in the hands or feet  redness, swelling, or sores on hands or feet  stomach pain  unusual bleeding Side effects that usually do not require medical attention (report to your doctor or health care professional if they continue or are bothersome):  changes in finger or toe nails  diarrhea  dry or itchy skin  hair loss  headache  loss of appetite  sensitivity of eyes to the light  stomach upset  unusually teary eyes This list may not describe all possible side effects. Call your doctor for medical advice about side effects.  You may report side effects to FDA at 1-800-FDA-1088. Where should I keep my medicine? This drug is given in a hospital or clinic and will not be stored at home. NOTE: This sheet is a summary. It may not cover all possible information. If you have questions about this medicine, talk to your doctor, pharmacist, or health care provider.  2020 Elsevier/Gold Standard (2007-11-03 13:53:16)     

## 2020-02-16 ENCOUNTER — Telehealth: Payer: Self-pay | Admitting: Nurse Practitioner

## 2020-02-16 DIAGNOSIS — J189 Pneumonia, unspecified organism: Secondary | ICD-10-CM | POA: Diagnosis not present

## 2020-02-16 DIAGNOSIS — C779 Secondary and unspecified malignant neoplasm of lymph node, unspecified: Secondary | ICD-10-CM | POA: Diagnosis not present

## 2020-02-16 DIAGNOSIS — K567 Ileus, unspecified: Secondary | ICD-10-CM | POA: Diagnosis not present

## 2020-02-16 DIAGNOSIS — I10 Essential (primary) hypertension: Secondary | ICD-10-CM | POA: Diagnosis not present

## 2020-02-16 DIAGNOSIS — A419 Sepsis, unspecified organism: Secondary | ICD-10-CM | POA: Diagnosis not present

## 2020-02-16 DIAGNOSIS — C169 Malignant neoplasm of stomach, unspecified: Secondary | ICD-10-CM | POA: Diagnosis not present

## 2020-02-16 DIAGNOSIS — C787 Secondary malignant neoplasm of liver and intrahepatic bile duct: Secondary | ICD-10-CM | POA: Diagnosis not present

## 2020-02-16 DIAGNOSIS — Z483 Aftercare following surgery for neoplasm: Secondary | ICD-10-CM | POA: Diagnosis not present

## 2020-02-16 DIAGNOSIS — C786 Secondary malignant neoplasm of retroperitoneum and peritoneum: Secondary | ICD-10-CM | POA: Diagnosis not present

## 2020-02-16 NOTE — Telephone Encounter (Signed)
Scheduled per 8/4 los. Noted to give pt appt calendar next visit.

## 2020-02-17 ENCOUNTER — Inpatient Hospital Stay: Payer: Medicare PPO

## 2020-02-17 ENCOUNTER — Other Ambulatory Visit: Payer: Self-pay

## 2020-02-17 VITALS — BP 138/79 | HR 117 | Temp 98.8°F | Resp 18

## 2020-02-17 DIAGNOSIS — R5383 Other fatigue: Secondary | ICD-10-CM | POA: Diagnosis not present

## 2020-02-17 DIAGNOSIS — C786 Secondary malignant neoplasm of retroperitoneum and peritoneum: Secondary | ICD-10-CM | POA: Diagnosis not present

## 2020-02-17 DIAGNOSIS — D649 Anemia, unspecified: Secondary | ICD-10-CM | POA: Diagnosis not present

## 2020-02-17 DIAGNOSIS — K311 Adult hypertrophic pyloric stenosis: Secondary | ICD-10-CM | POA: Diagnosis not present

## 2020-02-17 DIAGNOSIS — Z5111 Encounter for antineoplastic chemotherapy: Secondary | ICD-10-CM | POA: Diagnosis not present

## 2020-02-17 DIAGNOSIS — C787 Secondary malignant neoplasm of liver and intrahepatic bile duct: Secondary | ICD-10-CM | POA: Diagnosis not present

## 2020-02-17 DIAGNOSIS — Z5112 Encounter for antineoplastic immunotherapy: Secondary | ICD-10-CM | POA: Diagnosis not present

## 2020-02-17 DIAGNOSIS — K5900 Constipation, unspecified: Secondary | ICD-10-CM | POA: Diagnosis not present

## 2020-02-17 DIAGNOSIS — C169 Malignant neoplasm of stomach, unspecified: Secondary | ICD-10-CM

## 2020-02-17 MED ORDER — HEPARIN SOD (PORK) LOCK FLUSH 100 UNIT/ML IV SOLN
500.0000 [IU] | Freq: Once | INTRAVENOUS | Status: AC | PRN
  Administered 2020-02-17: 500 [IU]
  Filled 2020-02-17: qty 5

## 2020-02-17 MED ORDER — SODIUM CHLORIDE 0.9% FLUSH
10.0000 mL | INTRAVENOUS | Status: DC | PRN
  Administered 2020-02-17: 10 mL
  Filled 2020-02-17: qty 10

## 2020-02-17 NOTE — Patient Instructions (Signed)

## 2020-02-18 LAB — CULTURE, BLOOD (ROUTINE X 2): Culture: NO GROWTH

## 2020-02-21 NOTE — Progress Notes (Addendum)
[facility]   Telephone:(336) [phone] Fax:(336) [phone]   Clinic Follow up Note   Patient Care Team: Gaynelle Arabian, MD as PCP - General (Family Medicine) Jonnie Finner, RN as Oncology Nurse Navigator Ladell Pier, MD as Consulting Physician (Oncology) 02/22/2020  CHIEF COMPLAINT: Follow-up metastatic gastric cancer  CURRENT THERAPY: FOLFOX with trastuzumab(q14 days)and pembrolizumab(q21 days), starting 01/31/20  INTERVAL HISTORY: Drew [name] returns for follow-up and cycle 2 pembrolizumab as scheduled.  He completed cycle 2 FOLFOX and Herceptin on 02/15/2020.  He feels better today, "more alert but still fatigued."  He is able to be out of bed, functional, doing some light activities.  He continues to lose weight despite taking in 2-3 Ensure per day plus protein plus meals and snacks.  He can tolerate more of a variety of diet.  He had one episode of nausea/vomiting when he ran out of antiemetics, otherwise taking Compazine before meals is effective.  Constipation resolved with prunes and warm apple juice.  Denies abdominal pain.  One week after oxaliplatin he still has cold sensitivity.  Denies mucositis, fever, chills, cough, chest pain, dyspnea, or leg edema.   MEDICAL HISTORY:  Past Medical History:  Diagnosis Date  . ED (erectile dysfunction)   . GERD (gastroesophageal reflux disease)   . High cholesterol   . Hypertension   . Hypogonadism male   . Prediabetes   . PUD (peptic ulcer disease)     SURGICAL HISTORY: Past Surgical History:  Procedure Laterality Date  . FINGER AMPUTATION    . TONSILLECTOMY      I have reviewed the social history and family history with the patient and they are unchanged from previous note.  ALLERGIES:  has No Known Allergies.  MEDICATIONS:  Current Outpatient Medications  Medication Sig Dispense Refill  . bisacodyl (DULCOLAX) 10 MG suppository Place 1 suppository (10 mg total) rectally as needed for moderate  constipation. 12 suppository 0  . HYDROcodone-acetaminophen (NORCO/VICODIN) 5-325 MG tablet Take 1 tablet by mouth every 6 (six) hours as needed for moderate pain.     Marland Kitchen lidocaine-prilocaine (EMLA) cream Apply 1 application topically as needed. 30 g 2  . Omega-3 Fatty Acids (FISH OIL) 1000 MG CAPS Take 2 capsules by mouth daily.    . ondansetron (ZOFRAN) 4 MG tablet Take 4 mg by mouth every 8 (eight) hours as needed for nausea or vomiting.     . polyethylene glycol (MIRALAX / GLYCOLAX) 17 g packet Take 17 g by mouth daily as needed. 14 each 0  . sennosides (SENOKOT) 8.8 MG/5ML syrup Take 10 mLs by mouth 2 (two) times daily. 240 mL 0  . tamsulosin (FLOMAX) 0.4 MG CAPS capsule Take 0.4 mg by mouth daily.    Marland Kitchen atorvastatin (LIPITOR) 10 MG tablet Take 1 tablet (10 mg total) by mouth daily. 30 tablet 2  . ELIQUIS 5 MG TABS tablet Take 1 tablet (5 mg total) by mouth 2 (two) times daily. 60 tablet 2  . FEROSUL 325 (65 Fe) MG tablet Take 1 tablet (325 mg total) by mouth daily. 30 tablet 2  . metoprolol succinate (TOPROL-XL) 25 MG 24 hr tablet Take 1 tablet (25 mg total) by mouth daily. 30 tablet 2  . pantoprazole (PROTONIX) 40 MG tablet Take 1 tablet (40 mg total) by mouth 2 (two) times daily. 60 tablet 11  . potassium chloride SA (KLOR-CON) 20 MEQ tablet Take 1 tablet (20 mEq total) by mouth daily for 14 doses. 14 tablet 0  .  prochlorperazine (COMPAZINE) 10 MG tablet Take 1 tablet (10 mg total) by mouth every 6 (six) hours as needed for nausea or vomiting. 30 tablet 2   No current facility-administered medications for this visit.   Facility-Administered Medications Ordered in Other Visits  Medication Dose Route Frequency Provider Last Rate Last Admin  . heparin lock flush 100 unit/mL  500 Units Intracatheter Once PRN Ladell Pier, MD      . pembrolizumab [facility]) 200 mg in sodium chloride 0.9 % 50 mL chemo infusion  200 mg Intravenous Once Ladell Pier, MD 116 mL/hr at 02/22/20 1053 200 mg at  02/22/20 1053  . sodium chloride flush (NS) 0.9 % injection 10 mL  10 mL Intracatheter PRN Ladell Pier, MD        PHYSICAL EXAMINATION:  Vitals:   02/22/20 0841  BP: 130/79  Pulse: 90  Resp: 20  Temp: (!) 96.5 F (35.8 C)  SpO2: 100%   Filed Weights   02/22/20 0841  Weight: 162 lb 6.4 oz (73.7 kg)    GENERAL:alert, no distress and comfortable SKIN: No rash to exposed skin EYES:  sclera clear OROPHARYNX: Moist mucous membranes without thrush or ulcers NECK: Without mass LUNGS: clear with normal breathing effort HEART: regular rate & rhythm, no lower extremity edema ABDOMEN:abdomen soft, non-tender and normal bowel sounds.  Fullness in the RUQ NEURO: alert & oriented x 3 with fluent speech PAC without erythema  LABORATORY DATA:  I have reviewed the data as listed CBC Latest Ref Rng & Units 02/22/2020 02/15/2020 02/13/2020  WBC 4.0 - 10.5 K/uL 4.7 6.8 11.5(H)  Hemoglobin 13.0 - 17.0 g/dL 8.9(L) 8.6(L) 9.4(L)  Hematocrit 39 - 52 % 29.0(L) 28.0(L) 31.1(L)  Platelets 150 - 400 K/uL 329 287 384     CMP Latest Ref Rng & Units 02/22/2020 02/15/2020 02/13/2020  Glucose 70 - 99 mg/dL 187(H) 157(H) 134(H)  BUN 8 - 23 mg/dL 10 7(L) 11  Creatinine 0.61 - 1.24 mg/dL 0.85 0.84 0.97  Sodium 135 - 145 mmol/L 137 138 137  Potassium 3.5 - 5.1 mmol/L 3.7 3.9 3.9  Chloride 98 - 111 mmol/L 103 103 100  CO2 22 - 32 mmol/L _0 Calcium 8.9 - 10.3 mg/dL 9.7 9.6 9.5  Total Protein 6.5 - 8.1 g/dL 7.0 7.0 7.7  Total Bilirubin 0.3 - 1.2 mg/dL 0.4 0.4 0.7  Alkaline Phos 38 - 126 U/L 106 106 115  AST 15 - 41 U/L 27 31 37  ALT 0 - 44 U/L _1 RADIOGRAPHIC STUDIES: I have personally reviewed the radiological images as listed and agreed with the findings in the report. No results found.   ASSESSMENT & PLAN:   1.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  CT abdomen/pelvis at [facility] 01/03/2020-masslike wall thickening of the gastric antrum, numerous  liver metastases, small volume ascites, numerous peritoneal nodules, enlarged periaortic and porta hepatis lymph nodes  Upper endoscopy 01/06/2020-large ulcer in the gastric antrum with heaped up and firm edges, Duodenal bulb largely "obliterated "by the mass, biopsy confirmed adenocarcinoma, HER-2 3+, intact mismatch repair protein expression, PD-L1: 4   CT chest 01/10/2020-acute bilateral pulmonary emboli, no lymphadenopathy or suspicious lung nodules  Baseline echo 01/17/20 at [facility], EF 60-65%  CT abdomen/pelvis 01/22/2020, gastric mass, liver metastases, peritoneal/omental nodules, ascites, right pleural effusion  Therapeutic and diagnostic paracentesis on 01/27/2020, 2.1 L removed;cytology shows malignant cells,malignant ascites  PD-L1combined positive score: 1 (from ascites/periteoneal fluid), reported  02/01/20   Cycle 1 FOLFOX/trastuzumab/pembrolizumab, 01/31/2020  Baseline CEA 13.6 (02/08/2020)  Cycle 2 FOLFOX/trastuzumab 02/15/20   Cycle 2 pembrolizumab on 02/22/2020  2.Nausea/vomiting secondary to #1 with gastric outlet obstruction  Gastric stent placement 01/12/2020, n/v resolved  3.Acute bilateral lower lobe pulmonary emboli on CT 01/10/2020-treated with apixaban,(givenLovenoxduring hospitalization)  4.Anemia-likely secondary to bleeding from the gastric mass  5.Status post Port-A-Cath placement 01/13/2020  6.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  He completed 2 cycles of FOLFOX/Herceptin and 1 cycle of  pembrolizumab.  He tolerates treatment well overall with fatigue and cold sensitivity.  GI side effects are managed with supportive care.  He is able to function and recover well.  He continues to have weight loss on treatment which is likely related to improvement of his severe edema during hospitalization, he is followed by dietitian who he will see today.   CBC and CMP adequate to proceed with cycle 2 pembrolizumab today. He will return for lab, f/u, and cycle 3 FOLFOX/herceptin in 1 week, then cycle 4 FOLFOX/herceptin and cycle 3 pembro in 3 weeks. Plan to restage after 4 cycles of FOLFOX/herceptin.     Orders Placed This Encounter  Procedures  . CBC with Differential (Cancer Center Only)    Standing Status:   Future    Standing Expiration Date:   02/21/2021  . CMP ([facility] only)    Standing Status:   Future    Standing Expiration Date:   02/21/2021   All questions were answered. The patient knows to call the clinic with any problems, questions or concerns. No barriers to learning were detected.    Alla Feeling, NP 02/22/20  This was a shared visit with [name].  Drew [name] is tolerating the systemic therapy well.  He will complete cycle 2 pembrolizumab today.  His performance status has improved.  We discussed the treatment and restaging plan with Drew [name] and his daughter.  Julieanne Manson, MD

## 2020-02-22 ENCOUNTER — Other Ambulatory Visit: Payer: Self-pay

## 2020-02-22 ENCOUNTER — Inpatient Hospital Stay (HOSPITAL_BASED_OUTPATIENT_CLINIC_OR_DEPARTMENT_OTHER): Payer: Medicare PPO | Admitting: Nurse Practitioner

## 2020-02-22 ENCOUNTER — Telehealth: Payer: Self-pay | Admitting: Nurse Practitioner

## 2020-02-22 ENCOUNTER — Inpatient Hospital Stay: Payer: Medicare PPO

## 2020-02-22 ENCOUNTER — Encounter: Payer: Self-pay | Admitting: Nurse Practitioner

## 2020-02-22 ENCOUNTER — Inpatient Hospital Stay: Payer: Medicare PPO | Admitting: Nutrition

## 2020-02-22 VITALS — Temp 97.9°F

## 2020-02-22 VITALS — BP 130/79 | HR 90 | Temp 96.5°F | Resp 20 | Ht 67.0 in | Wt 162.4 lb

## 2020-02-22 DIAGNOSIS — Z5111 Encounter for antineoplastic chemotherapy: Secondary | ICD-10-CM | POA: Diagnosis not present

## 2020-02-22 DIAGNOSIS — C169 Malignant neoplasm of stomach, unspecified: Secondary | ICD-10-CM | POA: Diagnosis not present

## 2020-02-22 DIAGNOSIS — C787 Secondary malignant neoplasm of liver and intrahepatic bile duct: Secondary | ICD-10-CM | POA: Diagnosis not present

## 2020-02-22 DIAGNOSIS — D649 Anemia, unspecified: Secondary | ICD-10-CM | POA: Diagnosis not present

## 2020-02-22 DIAGNOSIS — C786 Secondary malignant neoplasm of retroperitoneum and peritoneum: Secondary | ICD-10-CM | POA: Diagnosis not present

## 2020-02-22 DIAGNOSIS — K5900 Constipation, unspecified: Secondary | ICD-10-CM | POA: Diagnosis not present

## 2020-02-22 DIAGNOSIS — Z95828 Presence of other vascular implants and grafts: Secondary | ICD-10-CM

## 2020-02-22 DIAGNOSIS — Z5112 Encounter for antineoplastic immunotherapy: Secondary | ICD-10-CM | POA: Diagnosis not present

## 2020-02-22 DIAGNOSIS — R5383 Other fatigue: Secondary | ICD-10-CM | POA: Diagnosis not present

## 2020-02-22 DIAGNOSIS — K311 Adult hypertrophic pyloric stenosis: Secondary | ICD-10-CM | POA: Diagnosis not present

## 2020-02-22 LAB — CBC WITH DIFFERENTIAL (CANCER CENTER ONLY)
Abs Immature Granulocytes: 0.03 10*3/uL (ref 0.00–0.07)
Basophils Absolute: 0.1 10*3/uL (ref 0.0–0.1)
Basophils Relative: 1 %
Eosinophils Absolute: 0.1 10*3/uL (ref 0.0–0.5)
Eosinophils Relative: 1 %
HCT: 29 % — ABNORMAL LOW (ref 39.0–52.0)
Hemoglobin: 8.9 g/dL — ABNORMAL LOW (ref 13.0–17.0)
Immature Granulocytes: 1 %
Lymphocytes Relative: 15 %
Lymphs Abs: 0.7 10*3/uL (ref 0.7–4.0)
MCH: 26.5 pg (ref 26.0–34.0)
MCHC: 30.7 g/dL (ref 30.0–36.0)
MCV: 86.3 fL (ref 80.0–100.0)
Monocytes Absolute: 0.4 10*3/uL (ref 0.1–1.0)
Monocytes Relative: 8 %
Neutro Abs: 3.5 10*3/uL (ref 1.7–7.7)
Neutrophils Relative %: 74 %
Platelet Count: 329 10*3/uL (ref 150–400)
RBC: 3.36 MIL/uL — ABNORMAL LOW (ref 4.22–5.81)
RDW: 16.7 % — ABNORMAL HIGH (ref 11.5–15.5)
WBC Count: 4.7 10*3/uL (ref 4.0–10.5)
nRBC: 0 % (ref 0.0–0.2)

## 2020-02-22 LAB — CMP (CANCER CENTER ONLY)
ALT: 15 U/L (ref 0–44)
AST: 27 U/L (ref 15–41)
Albumin: 3.1 g/dL — ABNORMAL LOW (ref 3.5–5.0)
Alkaline Phosphatase: 106 U/L (ref 38–126)
Anion gap: 8 (ref 5–15)
BUN: 10 mg/dL (ref 8–23)
CO2: 26 mmol/L (ref 22–32)
Calcium: 9.7 mg/dL (ref 8.9–10.3)
Chloride: 103 mmol/L (ref 98–111)
Creatinine: 0.85 mg/dL (ref 0.61–1.24)
GFR, Est AFR Am: >60 mL/min (ref >60)
GFR, Estimated: >60 mL/min (ref >60)
Glucose, Bld: 187 mg/dL — ABNORMAL HIGH (ref 70–99)
Potassium: 3.7 mmol/L (ref 3.5–5.1)
Sodium: 137 mmol/L (ref 135–145)
Total Bilirubin: 0.4 mg/dL (ref 0.3–1.2)
Total Protein: 7 g/dL (ref 6.5–8.1)

## 2020-02-22 LAB — TSH: TSH: 1.408 u[IU]/mL (ref 0.320–4.118)

## 2020-02-22 MED ORDER — ATORVASTATIN CALCIUM 10 MG PO TABS: 10.0000 mg | ORAL_TABLET | Freq: Every day | ORAL | 2 refills | Status: DC

## 2020-02-22 MED ORDER — FEROSUL 325 (65 FE) MG PO TABS: 325.0000 mg | ORAL_TABLET | Freq: Every day | ORAL | 2 refills | Status: DC

## 2020-02-22 MED ORDER — SODIUM CHLORIDE 0.9 % IV SOLN
200.0000 mg | Freq: Once | INTRAVENOUS | Status: AC
  Administered 2020-02-22: 200 mg via INTRAVENOUS
  Filled 2020-02-22: qty 8

## 2020-02-22 MED ORDER — SODIUM CHLORIDE 0.9 % IV SOLN
Freq: Once | INTRAVENOUS | Status: AC
  Filled 2020-02-22: qty 250

## 2020-02-22 MED ORDER — ELIQUIS 5 MG PO TABS: 5.0000 mg | ORAL_TABLET | Freq: Two times a day (BID) | ORAL | 2 refills | Status: DC

## 2020-02-22 MED ORDER — PANTOPRAZOLE SODIUM 40 MG PO TBEC: 40.0000 mg | DELAYED_RELEASE_TABLET | Freq: Two times a day (BID) | ORAL | 11 refills | Status: DC

## 2020-02-22 MED ORDER — METOPROLOL SUCCINATE ER 25 MG PO TB24: 25.0000 mg | ORAL_TABLET | Freq: Every day | ORAL | 2 refills | Status: DC

## 2020-02-22 MED ORDER — PROCHLORPERAZINE MALEATE 10 MG PO TABS: 10.0000 mg | ORAL_TABLET | Freq: Four times a day (QID) | ORAL | 2 refills | Status: DC | PRN

## 2020-02-22 MED ORDER — SODIUM CHLORIDE 0.9% FLUSH
10.0000 mL | INTRAVENOUS | Status: DC | PRN
  Administered 2020-02-22: 10 mL
  Filled 2020-02-22: qty 10

## 2020-02-22 MED ORDER — HEPARIN SOD (PORK) LOCK FLUSH 100 UNIT/ML IV SOLN
500.0000 [IU] | Freq: Once | INTRAVENOUS | Status: AC | PRN
  Administered 2020-02-22: 500 [IU]
  Filled 2020-02-22: qty 5

## 2020-02-22 MED ORDER — SODIUM CHLORIDE 0.9% FLUSH
10.0000 mL | Freq: Once | INTRAVENOUS | Status: AC
  Administered 2020-02-22: 10 mL
  Filled 2020-02-22: qty 10

## 2020-02-22 NOTE — Patient Instructions (Signed)
[facility] Discharge Instructions for Patients Receiving Chemotherapy  Today you received the following chemotherapy agents keytruda  To help prevent nausea and vomiting after your treatment, we encourage you to take your nausea medication as directed If you develop nausea and vomiting that is not controlled by your nausea medication, call the clinic.   BELOW ARE SYMPTOMS THAT SHOULD BE REPORTED IMMEDIATELY:  *FEVER GREATER THAN 100.5 F  *CHILLS WITH OR WITHOUT FEVER  NAUSEA AND VOMITING THAT IS NOT CONTROLLED WITH YOUR NAUSEA MEDICATION  *UNUSUAL SHORTNESS OF BREATH  *UNUSUAL BRUISING OR BLEEDING  TENDERNESS IN MOUTH AND THROAT WITH OR WITHOUT PRESENCE OF ULCERS  *URINARY PROBLEMS  *BOWEL PROBLEMS  UNUSUAL RASH Items with * indicate a potential emergency and should be followed up as soon as possible.  Feel free to call the clinic should you have any questions or concerns. The clinic phone number is [phone].  Please show the CHEMO ALERT CARD at check-in to the Emergency Department and triage nurse.   

## 2020-02-22 NOTE — Telephone Encounter (Signed)
Scheduled per 08/11 los, patient received updated calender.

## 2020-02-22 NOTE — Progress Notes (Signed)
Nutrition follow-up completed with patient during infusion for metastatic gastric cancer status post gastric outlet obstruction and stent. Weight decreased and documented as 162.4 pounds August 11 down from 170 pounds August second. Patient reports improved edema, possibly resulting in documented weight loss. Labs reviewed: Noted glucose 187. Patient denies nausea and vomiting. Constipation is controlled by using prunes and warm apple juice. He is drinking 2 ensures daily.  Nutrition diagnosis: Unintended weight loss continue.  Intervention: Educated patient to continue smaller more frequent meals and snacks using high-calorie, high-protein foods. Reviewed strategies for bowel regimen. Recommended patient change oral nutrition supplements to Ensure Enlive Provided 1 complementary case. Increase oral nutrition supplements to 3 cartons daily. Questions were answered.  Teach back method used.  Contact information has been provided.  Monitoring, evaluation, goals: Patient will tolerate increased calories and protein to stabilize lean body mass.  Next visit: Tuesday, August 17 during infusion.  **Disclaimer: This note was dictated with voice recognition software. Similar sounding words can inadvertently be transcribed and this note may contain transcription errors which may not have been corrected upon publication of note.**

## 2020-02-22 NOTE — Progress Notes (Signed)
All meds refilled as requested

## 2020-02-23 ENCOUNTER — Ambulatory Visit (INDEPENDENT_AMBULATORY_CARE_PROVIDER_SITE_OTHER): Payer: Medicare PPO | Admitting: Neurology

## 2020-02-23 ENCOUNTER — Encounter: Payer: Self-pay | Admitting: Neurology

## 2020-02-23 VITALS — BP 153/84 | HR 99 | Ht 67.0 in | Wt 158.4 lb

## 2020-02-23 DIAGNOSIS — C779 Secondary and unspecified malignant neoplasm of lymph node, unspecified: Secondary | ICD-10-CM | POA: Diagnosis not present

## 2020-02-23 DIAGNOSIS — Z483 Aftercare following surgery for neoplasm: Secondary | ICD-10-CM | POA: Diagnosis not present

## 2020-02-23 DIAGNOSIS — J189 Pneumonia, unspecified organism: Secondary | ICD-10-CM | POA: Diagnosis not present

## 2020-02-23 DIAGNOSIS — K567 Ileus, unspecified: Secondary | ICD-10-CM | POA: Diagnosis not present

## 2020-02-23 DIAGNOSIS — C787 Secondary malignant neoplasm of liver and intrahepatic bile duct: Secondary | ICD-10-CM | POA: Diagnosis not present

## 2020-02-23 DIAGNOSIS — I6389 Other cerebral infarction: Secondary | ICD-10-CM

## 2020-02-23 DIAGNOSIS — I10 Essential (primary) hypertension: Secondary | ICD-10-CM | POA: Diagnosis not present

## 2020-02-23 DIAGNOSIS — C786 Secondary malignant neoplasm of retroperitoneum and peritoneum: Secondary | ICD-10-CM | POA: Diagnosis not present

## 2020-02-23 DIAGNOSIS — C169 Malignant neoplasm of stomach, unspecified: Secondary | ICD-10-CM | POA: Diagnosis not present

## 2020-02-23 DIAGNOSIS — A419 Sepsis, unspecified organism: Secondary | ICD-10-CM | POA: Diagnosis not present

## 2020-02-23 NOTE — Progress Notes (Signed)
NEUROLOGY CONSULTATION NOTE  SMARAN GAUS MRN: [id number] DOB: [birth year]  Referring provider: [name], NP Primary care provider: Gaynelle Arabian, MD  Reason for consult:  stroke  HISTORY OF PRESENT ILLNESS: Drew [name] is a 74 year old right-handed male with HTN, high cholesterol, prediabetes and PUD who presents for stroke.  History supplemented by hospital and referring provider's notes.  He is accompanied by his daughter who also provides history.  He was admitted to [facility] on 01/04/2020 progressive epigastric pain with intractable nausea and vomiting and upper GI bleed.  He was subsequently diagnosed with gastric outlet obstruction due to a gastric adenocarcinoma with metastatic disease to the liver and lymph nodes and was also found to have bilateral pulmonary emboli.  MRI of brain with and without contrast performed to evaluate for metastasis found multiple small acute/subacute infarcts in the bilateral corona radiata with possible small foci in the bilateral frontal corona radiata which may represent metastasis.  Neurology was consulted.  TEE was not performed due to risk of procedure and instead cardiolog recommended TTE with bubble study, which showed EF 60-65% without cardiac source of embolus.  Carotid US showed no hemodynamically significant stenosis.  Hgb A1c was 5.8.  He was discharged on Eliquis.  MRI BRAIN W WO:  IMPRESSION: 1. Multiple small acute or subacute infarctions in the bilateral corona radiata, including the centrum semiovale, and one in the right peritrigonal white matter. 2. Very small foci of mild enhancement in the bilateral frontal corona radiata (at least 3 on each side), of indeterminate etiology. These could be due to enhancement of subacute infarctions. The appearance is not typical for metastases, although these could conceivably still be metastases, including the possibility of metastatic disease within perivascular spaces, although  there is no appreciable leptomeningeal enhancement elsewhere. Correlation with CSF studies, including cytology, and short interval follow-up MRI without and with IV contrast may be of benefit.  3. Old small lacunar infarctions and moderate leukoaraiosis, including in the pons.  Current medications:  Eliquis, atorvastatin 10mg , Toprol-XL    PAST MEDICAL HISTORY: Past Medical History:  Diagnosis Date  . ED (erectile dysfunction)   . GERD (gastroesophageal reflux disease)   . High cholesterol   . Hypertension   . Hypogonadism male   . Prediabetes   . PUD (peptic ulcer disease)     PAST SURGICAL HISTORY: Past Surgical History:  Procedure Laterality Date  . FINGER AMPUTATION    . TONSILLECTOMY      MEDICATIONS: Current Outpatient Medications on File Prior to Visit  Medication Sig Dispense Refill  . atorvastatin (LIPITOR) 10 MG tablet Take 1 tablet (10 mg total) by mouth daily. 30 tablet 2  . bisacodyl (DULCOLAX) 10 MG suppository Place 1 suppository (10 mg total) rectally as needed for moderate constipation. 12 suppository 0  . ELIQUIS 5 MG TABS tablet Take 1 tablet (5 mg total) by mouth 2 (two) times daily. 60 tablet 2  . FEROSUL 325 (65 Fe) MG tablet Take 1 tablet (325 mg total) by mouth daily. 30 tablet 2  . HYDROcodone-acetaminophen (NORCO/VICODIN) 5-325 MG tablet Take 1 tablet by mouth every 6 (six) hours as needed for moderate pain.     Marland Kitchen lidocaine-prilocaine (EMLA) cream Apply 1 application topically as needed. 30 g 2  . metoprolol succinate (TOPROL-XL) 25 MG 24 hr tablet Take 1 tablet (25 mg total) by mouth daily. 30 tablet 2  . Omega-3 Fatty Acids (FISH OIL) 1000 MG CAPS Take 2 capsules by  mouth daily.    . ondansetron (ZOFRAN) 4 MG tablet Take 4 mg by mouth every 8 (eight) hours as needed for nausea or vomiting.     . pantoprazole (PROTONIX) 40 MG tablet Take 1 tablet (40 mg total) by mouth 2 (two) times daily. 60 tablet 11  . polyethylene glycol (MIRALAX / GLYCOLAX) 17 g  packet Take 17 g by mouth daily as needed. 14 each 0  . potassium chloride SA (KLOR-CON) 20 MEQ tablet Take 1 tablet (20 mEq total) by mouth daily for 14 doses. 14 tablet 0  . prochlorperazine (COMPAZINE) 10 MG tablet Take 1 tablet (10 mg total) by mouth every 6 (six) hours as needed for nausea or vomiting. 30 tablet 2  . sennosides (SENOKOT) 8.8 MG/5ML syrup Take 10 mLs by mouth 2 (two) times daily. 240 mL 0  . tamsulosin (FLOMAX) 0.4 MG CAPS capsule Take 0.4 mg by mouth daily.     No current facility-administered medications on file prior to visit.    ALLERGIES: No Known Allergies  FAMILY HISTORY: Family History  Problem Relation Age of Onset  . Diabetes Other   . Prostate cancer Brother        both brothers    SOCIAL HISTORY: Social History   Socioeconomic History  . Marital status: Married    Spouse name: Not on file  . Number of children: Not on file  . Years of education: Not on file  . Highest education level: Not on file  Occupational History  . Not on file  Tobacco Use  . Smoking status: Former Research scientist (life sciences)  . Smokeless tobacco: Never Used  Substance and Sexual Activity  . Alcohol use: Yes    Alcohol/week: 10.0 standard drinks    Types: 10 Glasses of wine per week  . Drug use: No  . Sexual activity: Not on file  Other Topics Concern  . Not on file  Social History Narrative  . Not on file   Social Determinants of Health   Financial Resource Strain:   . Difficulty of Paying Living Expenses:   Food Insecurity:   . Worried About Charity fundraiser in the Last Year:   . Arboriculturist in the Last Year:   Transportation Needs:   . Film/video editor (Medical):   Marland Kitchen Lack of Transportation (Non-Medical):   Physical Activity:   . Days of Exercise per Week:   . Minutes of Exercise per Session:   Stress:   . Feeling of Stress :   Social Connections:   . Frequency of Communication with Friends and Family:   . Frequency of Social Gatherings with Friends and  Family:   . Attends Religious Services:   . Active Member of Clubs or Organizations:   . Attends Archivist Meetings:   Marland Kitchen Marital Status:   Intimate Partner Violence:   . Fear of Current or Ex-Partner:   . Emotionally Abused:   Marland Kitchen Physically Abused:   . Sexually Abused:    PHYSICAL EXAM: Blood pressure (!) 153/84, pulse 99, height 5\' 7"  (1.702 m), weight 158 lb 6.4 oz (71.8 kg), SpO2 100 %. General: No acute distress.  Patient appears well-groomed.   Head:  Normocephalic/atraumatic Eyes:  fundi examined but not visualized Neck: supple, no paraspinal tenderness, full range of motion Back: No paraspinal tenderness Heart: regular rate and rhythm Lungs: Clear to auscultation bilaterally. Vascular: No carotid bruits. Neurological Exam: Mental status: alert and oriented to person, place, and time, recent and  remote memory intact, fund of knowledge intact, attention and concentration intact, speech fluent and not dysarthric, language intact. Cranial nerves: CN I: not tested CN II: pupils equal, round and reactive to light, visual fields intact CN III, IV, VI:  full range of motion, no nystagmus, no ptosis CN V: facial sensation intact CN VII: upper and lower face symmetric CN VIII: hearing intact CN IX, X: gag intact, uvula midline CN XI: sternocleidomastoid and trapezius muscles intact CN XII: tongue midline Bulk & Tone: normal, no fasciculations. Motor:  5/5 throughout Sensation:  Pinprick and vibration sensation intact. Deep Tendon Reflexes:  2+ throughout, toes downgoing.   Finger to nose testing:  Without dysmetria.   Heel to shin:  Without dysmetria.   Gait:  Normal station and stride.  Romberg negative.  IMPRESSION: 1.  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  HTN 3.  Bilateral pulmonary emboli 4.  Gastric adenocarcinoma  PLAN: 1.  Continue Eliquis.  Once he is off of Eliquis, will likely have him return to ASA.  May consider  Plavix instead. 2.  Atorvastatin (LDL goal less than 70) 3.  Blood pressure control 4.  Glycemic control (Hgb A1c goal less than 7) 5.  Follow up in 5 months.  Thank you for allowing me to take part in the care of this patient.  Metta Clines, DO  CC:  Gaynelle Arabian, MD  [name], NP

## 2020-02-23 NOTE — Patient Instructions (Signed)
I think the stroke is due to blood clots related to the cancer. Continue Eliquis and atorvastatin for now Follow up in 5 months.

## 2020-02-26 ENCOUNTER — Other Ambulatory Visit: Payer: Self-pay | Admitting: Oncology

## 2020-02-28 ENCOUNTER — Inpatient Hospital Stay: Payer: Medicare PPO

## 2020-02-28 ENCOUNTER — Other Ambulatory Visit: Payer: Self-pay

## 2020-02-28 ENCOUNTER — Inpatient Hospital Stay (HOSPITAL_BASED_OUTPATIENT_CLINIC_OR_DEPARTMENT_OTHER): Payer: Medicare PPO | Admitting: Oncology

## 2020-02-28 ENCOUNTER — Inpatient Hospital Stay: Payer: Medicare PPO | Admitting: Nutrition

## 2020-02-28 VITALS — BP 137/72 | HR 76 | Temp 97.6°F | Resp 17 | Ht 67.0 in | Wt 163.9 lb

## 2020-02-28 DIAGNOSIS — C169 Malignant neoplasm of stomach, unspecified: Secondary | ICD-10-CM | POA: Diagnosis not present

## 2020-02-28 DIAGNOSIS — Z5112 Encounter for antineoplastic immunotherapy: Secondary | ICD-10-CM | POA: Diagnosis not present

## 2020-02-28 DIAGNOSIS — D649 Anemia, unspecified: Secondary | ICD-10-CM | POA: Diagnosis not present

## 2020-02-28 DIAGNOSIS — K311 Adult hypertrophic pyloric stenosis: Secondary | ICD-10-CM | POA: Diagnosis not present

## 2020-02-28 DIAGNOSIS — Z5111 Encounter for antineoplastic chemotherapy: Secondary | ICD-10-CM | POA: Diagnosis not present

## 2020-02-28 DIAGNOSIS — C186 Malignant neoplasm of descending colon: Secondary | ICD-10-CM | POA: Diagnosis not present

## 2020-02-28 DIAGNOSIS — K5900 Constipation, unspecified: Secondary | ICD-10-CM | POA: Diagnosis not present

## 2020-02-28 DIAGNOSIS — C787 Secondary malignant neoplasm of liver and intrahepatic bile duct: Secondary | ICD-10-CM | POA: Diagnosis not present

## 2020-02-28 DIAGNOSIS — R5383 Other fatigue: Secondary | ICD-10-CM | POA: Diagnosis not present

## 2020-02-28 DIAGNOSIS — C786 Secondary malignant neoplasm of retroperitoneum and peritoneum: Secondary | ICD-10-CM | POA: Diagnosis not present

## 2020-02-28 DIAGNOSIS — Z95828 Presence of other vascular implants and grafts: Secondary | ICD-10-CM

## 2020-02-28 LAB — CBC WITH DIFFERENTIAL (CANCER CENTER ONLY)
Abs Immature Granulocytes: 0.02 10*3/uL (ref 0.00–0.07)
Basophils Absolute: 0 10*3/uL (ref 0.0–0.1)
Basophils Relative: 1 %
Eosinophils Absolute: 0.1 10*3/uL (ref 0.0–0.5)
Eosinophils Relative: 1 %
HCT: 29.4 % — ABNORMAL LOW (ref 39.0–52.0)
Hemoglobin: 8.9 g/dL — ABNORMAL LOW (ref 13.0–17.0)
Immature Granulocytes: 0 %
Lymphocytes Relative: 10 %
Lymphs Abs: 0.7 10*3/uL (ref 0.7–4.0)
MCH: 26.4 pg (ref 26.0–34.0)
MCHC: 30.3 g/dL (ref 30.0–36.0)
MCV: 87.2 fL (ref 80.0–100.0)
Monocytes Absolute: 0.5 10*3/uL (ref 0.1–1.0)
Monocytes Relative: 7 %
Neutro Abs: 5.4 10*3/uL (ref 1.7–7.7)
Neutrophils Relative %: 81 %
Platelet Count: 281 10*3/uL (ref 150–400)
RBC: 3.37 MIL/uL — ABNORMAL LOW (ref 4.22–5.81)
RDW: 17.1 % — ABNORMAL HIGH (ref 11.5–15.5)
WBC Count: 6.6 10*3/uL (ref 4.0–10.5)
nRBC: 0 % (ref 0.0–0.2)

## 2020-02-28 LAB — CMP (CANCER CENTER ONLY)
ALT: 15 U/L (ref 0–44)
AST: 25 U/L (ref 15–41)
Albumin: 3.1 g/dL — ABNORMAL LOW (ref 3.5–5.0)
Alkaline Phosphatase: 100 U/L (ref 38–126)
Anion gap: 7 (ref 5–15)
BUN: 9 mg/dL (ref 8–23)
CO2: 28 mmol/L (ref 22–32)
Calcium: 9.8 mg/dL (ref 8.9–10.3)
Chloride: 105 mmol/L (ref 98–111)
Creatinine: 0.79 mg/dL (ref 0.61–1.24)
GFR, Est AFR Am: >60 mL/min (ref >60)
GFR, Estimated: >60 mL/min (ref >60)
Glucose, Bld: 157 mg/dL — ABNORMAL HIGH (ref 70–99)
Potassium: 3.6 mmol/L (ref 3.5–5.1)
Sodium: 140 mmol/L (ref 135–145)
Total Bilirubin: 0.4 mg/dL (ref 0.3–1.2)
Total Protein: 6.8 g/dL (ref 6.5–8.1)

## 2020-02-28 MED ORDER — FLUOROURACIL CHEMO INJECTION 2.5 GM/50ML
300.0000 mg/m2 | Freq: Once | INTRAVENOUS | Status: AC
  Administered 2020-02-28: 600 mg via INTRAVENOUS
  Filled 2020-02-28: qty 12

## 2020-02-28 MED ORDER — LEUCOVORIN CALCIUM INJECTION 350 MG
300.0000 mg/m2 | Freq: Once | INTRAVENOUS | Status: AC
  Administered 2020-02-28: 604 mg via INTRAVENOUS
  Filled 2020-02-28: qty 30.2

## 2020-02-28 MED ORDER — DIPHENHYDRAMINE HCL 25 MG PO CAPS
50.0000 mg | ORAL_CAPSULE | Freq: Once | ORAL | Status: AC
  Administered 2020-02-28: 50 mg via ORAL

## 2020-02-28 MED ORDER — PALONOSETRON HCL INJECTION 0.25 MG/5ML
INTRAVENOUS | Status: AC
  Filled 2020-02-28: qty 5

## 2020-02-28 MED ORDER — SODIUM CHLORIDE 0.9 % IV SOLN
2000.0000 mg/m2 | INTRAVENOUS | Status: DC
  Administered 2020-02-28: 4000 mg via INTRAVENOUS
  Filled 2020-02-28: qty 80

## 2020-02-28 MED ORDER — HEPARIN SOD (PORK) LOCK FLUSH 100 UNIT/ML IV SOLN
500.0000 [IU] | Freq: Once | INTRAVENOUS | Status: DC | PRN
  Filled 2020-02-28: qty 5

## 2020-02-28 MED ORDER — SODIUM CHLORIDE 0.9% FLUSH
10.0000 mL | Freq: Once | INTRAVENOUS | Status: AC
  Administered 2020-02-28: 10 mL
  Filled 2020-02-28: qty 10

## 2020-02-28 MED ORDER — ACETAMINOPHEN 325 MG PO TABS
650.0000 mg | ORAL_TABLET | Freq: Once | ORAL | Status: AC
  Administered 2020-02-28: 650 mg via ORAL

## 2020-02-28 MED ORDER — DEXTROSE 5 % IV SOLN
Freq: Once | INTRAVENOUS | Status: AC
  Filled 2020-02-28: qty 250

## 2020-02-28 MED ORDER — ACETAMINOPHEN 325 MG PO TABS
ORAL_TABLET | ORAL | Status: AC
  Filled 2020-02-28: qty 2

## 2020-02-28 MED ORDER — PALONOSETRON HCL INJECTION 0.25 MG/5ML
0.2500 mg | Freq: Once | INTRAVENOUS | Status: AC
  Administered 2020-02-28: 0.25 mg via INTRAVENOUS

## 2020-02-28 MED ORDER — OXALIPLATIN CHEMO INJECTION 100 MG/20ML
85.0000 mg/m2 | Freq: Once | INTRAVENOUS | Status: AC
  Administered 2020-02-28: 170 mg via INTRAVENOUS
  Filled 2020-02-28: qty 34

## 2020-02-28 MED ORDER — SODIUM CHLORIDE 0.9% FLUSH
10.0000 mL | INTRAVENOUS | Status: DC | PRN
  Filled 2020-02-28: qty 10

## 2020-02-28 MED ORDER — TRASTUZUMAB-ANNS CHEMO 150 MG IV SOLR
300.0000 mg | Freq: Once | INTRAVENOUS | Status: AC
  Administered 2020-02-28: 300 mg via INTRAVENOUS
  Filled 2020-02-28: qty 14.29

## 2020-02-28 MED ORDER — DIPHENHYDRAMINE HCL 25 MG PO CAPS
ORAL_CAPSULE | ORAL | Status: AC
  Filled 2020-02-28: qty 2

## 2020-02-28 MED ORDER — SODIUM CHLORIDE 0.9 % IV SOLN
10.0000 mg | Freq: Once | INTRAVENOUS | Status: AC
  Administered 2020-02-28: 10 mg via INTRAVENOUS
  Filled 2020-02-28: qty 10

## 2020-02-28 MED ORDER — SODIUM CHLORIDE 0.9 % IV SOLN
INTRAVENOUS | Status: DC
  Filled 2020-02-28: qty 250

## 2020-02-28 NOTE — Progress Notes (Signed)
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 Weight improved and documented as 163.9 pounds on September 17 up slightly from 162.4 pounds August 11. Labs reviewed: Glucose 157 and albumin 3.1. Patient reports he had malaise. Also reports nausea for which he takes Compazine. Reports his appetite is improved and he is still drinking oral nutrition supplements. He voices no nutrition concerns today and is in good spirits.  Nutrition diagnosis: Unintended weight loss improved.  Intervention: Provided support and encouragement for patient to continue strategies for increased calories and protein. Recommended patient continue oral nutrition supplements 2-3 bottles daily. Encouraged medications as needed for nausea.  Monitoring, evaluation, goals: Patient will tolerate increased calories and protein to minimize weight loss.  Next visit: Wednesday, September 15 during infusion.  **Disclaimer: This note was dictated with voice recognition software. Similar sounding words can inadvertently be transcribed and this note may contain transcription errors which may not have been corrected upon publication of note.**

## 2020-02-28 NOTE — Progress Notes (Signed)
[facility] OFFICE PROGRESS NOTE   Diagnosis: Gastric cancer  INTERVAL HISTORY:   Mr. Drew [name] returns as scheduled.  He is here with his daughter.  He reports malaise.  No bleeding.  He takes Compazine for nausea.  He is eating.  Mild intermittent peripheral tingling.  No cold sensitivity.  His appetite has improved.  Mr. Trautman was last treated with pembrolizumab on 02/22/2020.  Objective:  Vital signs in last 24 hours:  Blood pressure 137/72, pulse 76, temperature 97.6 F (36.4 C), temperature source Tympanic, resp. rate 17, height _0  (1.702 m), weight 163 lb 14.4 oz (74.3 kg), SpO2 100 %.    HEENT: No thrush or ulcers Resp: Lungs clear bilaterally Cardio: Regular rate and rhythm GI: No hepatomegaly, no mass, nontender Vascular: No leg edema  Skin: Dryness of the soles  Portacath/PICC-without erythema  Lab Results:  Lab Results  Component Value Date   WBC 6.6 02/28/2020   HGB 8.9 (L) 02/28/2020   HCT 29.4 (L) 02/28/2020   MCV 87.2 02/28/2020   PLT 281 02/28/2020   NEUTROABS 5.4 02/28/2020    CMP  Lab Results  Component Value Date   NA 140 02/28/2020   K 3.6 02/28/2020   CL 105 02/28/2020   CO2 28 02/28/2020   GLUCOSE 157 (H) 02/28/2020   BUN 9 02/28/2020   CREATININE 0.79 02/28/2020   CALCIUM 9.8 02/28/2020   PROT 6.8 02/28/2020   ALBUMIN 3.1 (L) 02/28/2020   AST 25 02/28/2020   ALT 15 02/28/2020   ALKPHOS 100 02/28/2020   BILITOT 0.4 02/28/2020   GFRNONAA >60 02/28/2020   GFRAA >60 02/28/2020    Lab Results  Component Value Date   CEA1 13.66 (H) 02/08/2020     Medications: I have reviewed the patient's current medications.   Assessment/Plan: 1.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  CT abdomen/pelvis at [facility] 01/03/2020-masslike wall thickening of the gastric antrum, numerous liver metastases, small volume ascites, numerous peritoneal nodules, enlarged periaortic and porta hepatis lymph  nodes  Upper endoscopy 01/06/2020-large ulcer in the gastric antrum with heaped up and firm edges, Duodenal bulb largely "obliterated "by the mass, biopsy confirmed adenocarcinoma, HER-2 3+, intact mismatch repair protein expression, PD-L1: 4   CT chest 01/10/2020-acute bilateral pulmonary emboli, no lymphadenopathy or suspicious lung nodules  Baseline echo 01/17/20 at [place], EF 60-65%  CT abdomen/pelvis 01/22/2020, gastric mass, liver metastases, peritoneal/omental nodules, ascites, right pleural effusion  Therapeutic and diagnostic paracentesis on 01/27/2020, 2.1 L removed;cytology shows malignant cells,malignant ascites  PD-L1 combined positive score: 1 (from ascites/periteoneal fluid), reported 02/01/20   Cycle 1 FOLFOX with trastuzumab and pembrolizumab, 01/31/2020  Baseline CEA 13.6 (02/08/2020)  Cycle 2 FOLFOX/trastuzumab 02/15/20  Pembrolizumab 02/22/2020  Cycle 3 FOLFOX/trastuzumab 02/28/2020  2.Nausea/vomiting secondary to #1 with gastric outlet obstruction  Gastric stent placement 01/12/2020, n/v resolved  3.Acute bilateral lower lobe pulmonary emboli on CT 01/10/2020-treated with apixaban,(givenLovenoxduring hospitalization)  4.Anemia-likely secondary to bleeding from the gastric mass  5.Status post Port-A-Cath placement 01/13/2020  6.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 appears stable.  He will complete another cycle of FOLFOX/trastuzumab today.  He will return in 2 weeks for FOLFOX/trastuzumab and pembrolizumab.  He  will be scheduled for a restaging CT evaluation after the next cycle of chemotherapy.  He continues anticoagulation therapy.  [name] will be a candidate for the COVID-19 booster vaccine.  Betsy Coder, MD  02/28/2020  11:57 AM

## 2020-02-28 NOTE — Patient Instructions (Signed)
[facility] Discharge Instructions for Patients Receiving Chemotherapy  Today you received the following chemotherapy agents: pembrolizumab, trastuzumab, oxaliplatin, leucovorin, fluorouracil.   To help prevent nausea and vomiting after your treatment, we encourage you to take your nausea medication as prescribed by your physician.    If you develop nausea and vomiting that is not controlled by your nausea medication, call the clinic.   BELOW ARE SYMPTOMS THAT SHOULD BE REPORTED IMMEDIATELY:  *FEVER GREATER THAN 100.5 F  *CHILLS WITH OR WITHOUT FEVER  NAUSEA AND VOMITING THAT IS NOT CONTROLLED WITH YOUR NAUSEA MEDICATION  *UNUSUAL SHORTNESS OF BREATH  *UNUSUAL BRUISING OR BLEEDING  TENDERNESS IN MOUTH AND THROAT WITH OR WITHOUT PRESENCE OF ULCERS  *URINARY PROBLEMS  *BOWEL PROBLEMS  UNUSUAL RASH Items with * indicate a potential emergency and should be followed up as soon as possible.  Feel free to call the clinic should you have any questions or concerns. The clinic phone number is [phone].  Please show the [facility] at check-in to the Emergency Department and triage nurse.  Pembrolizumab injection What is this medicine? PEMBROLIZUMAB (pem broe liz ue mab) is a monoclonal antibody. It is used to treat certain types of cancer. This medicine may be used for other purposes; ask your health care provider or pharmacist if you have questions. COMMON BRAND NAME(S): Keytruda What should I tell my health care provider before I take this medicine? They need to know if you have any of these conditions:  diabetes  immune system problems  inflammatory bowel disease  liver disease  lung or breathing disease  lupus  received or scheduled to receive an organ transplant or a stem-cell transplant that uses donor stem cells  an unusual or allergic reaction to pembrolizumab, other medicines, foods, dyes, or preservatives  pregnant or trying to get  pregnant  breast-feeding How should I use this medicine? This medicine is for infusion into a vein. It is given by a health care professional in a hospital or clinic setting. A special MedGuide will be given to you before each treatment. Be sure to read this information carefully each time. Talk to your pediatrician regarding the use of this medicine in children. While this drug may be prescribed for children as young as 6 months for selected conditions, precautions do apply. Overdosage: If you think you have taken too much of this medicine contact a poison control center or emergency room at once. NOTE: This medicine is only for you. Do not share this medicine with others. What if I miss a dose? It is important not to miss your dose. Call your doctor or health care professional if you are unable to keep an appointment. What may interact with this medicine? Interactions have not been studied. Give your health care provider a list of all the medicines, herbs, non-prescription drugs, or dietary supplements you use. Also tell them if you smoke, drink alcohol, or use illegal drugs. Some items may interact with your medicine. This list may not describe all possible interactions. Give your health care provider a list of all the medicines, herbs, non-prescription drugs, or dietary supplements you use. Also tell them if you smoke, drink alcohol, or use illegal drugs. Some items may interact with your medicine. What should I watch for while using this medicine? Your condition will be monitored carefully while you are receiving this medicine. You may need blood work done while you are taking this medicine. Do not become pregnant while taking this medicine  or for 4 months after stopping it. Women should inform their doctor if they wish to become pregnant or think they might be pregnant. There is a potential for serious side effects to an unborn child. Talk to your health care professional or pharmacist for  more information. Do not breast-feed an infant while taking this medicine or for 4 months after the last dose. What side effects may I notice from receiving this medicine? Side effects that you should report to your doctor or health care professional as soon as possible:  allergic reactions like skin rash, itching or hives, swelling of the face, lips, or tongue  bloody or black, tarry  breathing problems  changes in vision  chest pain  chills  confusion  constipation  cough  diarrhea  dizziness or feeling faint or lightheaded  fast or irregular heartbeat  fever  flushing  joint pain  low blood counts - this medicine may decrease the number of white blood cells, red blood cells and platelets. You may be at increased risk for infections and bleeding.  muscle pain  muscle weakness  pain, tingling, numbness in the hands or feet  persistent headache  redness, blistering, peeling or loosening of the skin, including inside the mouth  signs and symptoms of high blood sugar such as dizziness; dry mouth; dry skin; fruity breath; nausea; stomach pain; increased hunger or thirst; increased urination  signs and symptoms of kidney injury like trouble passing urine or change in the amount of urine  signs and symptoms of liver injury like dark urine, light-colored stools, loss of appetite, nausea, right upper belly pain, yellowing of the eyes or skin  sweating  swollen lymph nodes  weight loss Side effects that usually do not require medical attention (report to your doctor or health care professional if they continue or are bothersome):  decreased appetite  hair loss  muscle pain  tiredness This list may not describe all possible side effects. Call your doctor for medical advice about side effects. You may report side effects to FDA at 1-800-FDA-1088. Where should I keep my medicine? This drug is given in a hospital or clinic and will not be stored at home. NOTE:  This sheet is a summary. It may not cover all possible information. If you have questions about this medicine, talk to your doctor, pharmacist, or health care provider.  2020 Elsevier/Gold Standard (2019-05-06 18:07:58)  Trastuzumab injection for infusion What is this medicine? TRASTUZUMAB (tras TOO zoo mab) is a monoclonal antibody. It is used to treat breast cancer and stomach cancer. This medicine may be used for other purposes; ask your health care provider or pharmacist if you have questions. COMMON BRAND NAME(S): Herceptin, Galvin Proffer, Trazimera What should I tell my health care provider before I take this medicine? They need to know if you have any of these conditions:  heart disease  heart failure  lung or breathing disease, like asthma  an unusual or allergic reaction to trastuzumab, benzyl alcohol, or other medications, foods, dyes, or preservatives  pregnant or trying to get pregnant  breast-feeding How should I use this medicine? This drug is given as an infusion into a vein. It is administered in a hospital or clinic by a specially trained health care professional. Talk to your pediatrician regarding the use of this medicine in children. This medicine is not approved for use in children. Overdosage: If you think you have taken too much of this medicine contact a poison control center or  emergency room at once. NOTE: This medicine is only for you. Do not share this medicine with others. What if I miss a dose? It is important not to miss a dose. Call your doctor or health care professional if you are unable to keep an appointment. What may interact with this medicine? This medicine may interact with the following medications:  certain types of chemotherapy, such as daunorubicin, doxorubicin, epirubicin, and idarubicin This list may not describe all possible interactions. Give your health care provider a list of all the medicines, herbs,  non-prescription drugs, or dietary supplements you use. Also tell them if you smoke, drink alcohol, or use illegal drugs. Some items may interact with your medicine. What should I watch for while using this medicine? Visit your doctor for checks on your progress. Report any side effects. Continue your course of treatment even though you feel ill unless your doctor tells you to stop. Call your doctor or health care professional for advice if you get a fever, chills or sore throat, or other symptoms of a cold or flu. Do not treat yourself. Try to avoid being around people who are sick. You may experience fever, chills and shaking during your first infusion. These effects are usually mild and can be treated with other medicines. Report any side effects during the infusion to your health care professional. Fever and chills usually do not happen with later infusions. Do not become pregnant while taking this medicine or for 7 months after stopping it. Women should inform their doctor if they wish to become pregnant or think they might be pregnant. Women of child-bearing potential will need to have a negative pregnancy test before starting this medicine. There is a potential for serious side effects to an unborn child. Talk to your health care professional or pharmacist for more information. Do not breast-feed an infant while taking this medicine or for 7 months after stopping it. Women must use effective birth control with this medicine. What side effects may I notice from receiving this medicine? Side effects that you should report to your doctor or health care professional as soon as possible:  allergic reactions like skin rash, itching or hives, swelling of the face, lips, or tongue  chest pain or palpitations  cough  dizziness  feeling faint or lightheaded, falls  fever  general ill feeling or flu-like symptoms  signs of worsening heart failure like breathing problems; swelling in your legs and  feet  unusually weak or tired Side effects that usually do not require medical attention (report to your doctor or health care professional if they continue or are bothersome):  bone pain  changes in taste  diarrhea  joint pain  nausea/vomiting  weight loss This list may not describe all possible side effects. Call your doctor for medical advice about side effects. You may report side effects to FDA at 1-800-FDA-1088. Where should I keep my medicine? This drug is given in a hospital or clinic and will not be stored at home. NOTE: This sheet is a summary. It may not cover all possible information. If you have questions about this medicine, talk to your doctor, pharmacist, or health care provider.  2020 Elsevier/Gold Standard (2016-06-24 14:37:52)  Oxaliplatin Injection What is this medicine? OXALIPLATIN (ox AL i PLA tin) is a chemotherapy drug. It targets fast dividing cells, like cancer cells, and causes these cells to die. This medicine is used to treat cancers of the colon and rectum, and many other cancers. This medicine may  be used for other purposes; ask your health care provider or pharmacist if you have questions. COMMON BRAND NAME(S): Eloxatin What should I tell my health care provider before I take this medicine? They need to know if you have any of these conditions:  heart disease  history of irregular heartbeat  liver disease  low blood counts, like white cells, platelets, or red blood cells  lung or breathing disease, like asthma  take medicines that treat or prevent blood clots  tingling of the fingers or toes, or other nerve disorder  an unusual or allergic reaction to oxaliplatin, other chemotherapy, other medicines, foods, dyes, or preservatives  pregnant or trying to get pregnant  breast-feeding How should I use this medicine? This drug is given as an infusion into a vein. It is administered in a hospital or clinic by a specially trained health care  professional. Talk to your pediatrician regarding the use of this medicine in children. Special care may be needed. Overdosage: If you think you have taken too much of this medicine contact a poison control center or emergency room at once. NOTE: This medicine is only for you. Do not share this medicine with others. What if I miss a dose? It is important not to miss a dose. Call your doctor or health care professional if you are unable to keep an appointment. What may interact with this medicine? Do not take this medicine with any of the following medications:  cisapride  dronedarone  pimozide  thioridazine This medicine may also interact with the following medications:  aspirin and aspirin-like medicines  certain medicines that treat or prevent blood clots like warfarin, apixaban, dabigatran, and rivaroxaban  cisplatin  cyclosporine  diuretics  medicines for infection like acyclovir, adefovir, amphotericin B, bacitracin, cidofovir, foscarnet, ganciclovir, gentamicin, pentamidine, vancomycin  NSAIDs, medicines for pain and inflammation, like ibuprofen or naproxen  other medicines that prolong the QT interval (an abnormal heart rhythm)  pamidronate  zoledronic acid This list may not describe all possible interactions. Give your health care provider a list of all the medicines, herbs, non-prescription drugs, or dietary supplements you use. Also tell them if you smoke, drink alcohol, or use illegal drugs. Some items may interact with your medicine. What should I watch for while using this medicine? Your condition will be monitored carefully while you are receiving this medicine. You may need blood work done while you are taking this medicine. This medicine may make you feel generally unwell. This is not uncommon as chemotherapy can affect healthy cells as well as cancer cells. Report any side effects. Continue your course of treatment even though you feel ill unless your  healthcare professional tells you to stop. This medicine can make you more sensitive to cold. Do not drink cold drinks or use ice. Cover exposed skin before coming in contact with cold temperatures or cold objects. When out in cold weather wear warm clothing and cover your mouth and nose to warm the air that goes into your lungs. Tell your doctor if you get sensitive to the cold. Do not become pregnant while taking this medicine or for 9 months after stopping it. Women should inform their health care professional if they wish to become pregnant or think they might be pregnant. Men should not father a child while taking this medicine and for 6 months after stopping it. There is potential for serious side effects to an unborn child. Talk to your health care professional for more information. Do not breast-feed a  child while taking this medicine or for 3 months after stopping it. This medicine has caused ovarian failure in some women. This medicine may make it more difficult to get pregnant. Talk to your health care professional if you are concerned about your fertility. This medicine has caused decreased sperm counts in some men. This may make it more difficult to father a child. Talk to your health care professional if you are concerned about your fertility. This medicine may increase your risk of getting an infection. Call your health care professional for advice if you get a fever, chills, or sore throat, or other symptoms of a cold or flu. Do not treat yourself. Try to avoid being around people who are sick. Avoid taking medicines that contain aspirin, acetaminophen, ibuprofen, naproxen, or ketoprofen unless instructed by your health care professional. These medicines may hide a fever. Be careful brushing or flossing your teeth or using a toothpick because you may get an infection or bleed more easily. If you have any dental work done, tell your dentist you are receiving this medicine. What side effects  may I notice from receiving this medicine? Side effects that you should report to your doctor or health care professional as soon as possible:  allergic reactions like skin rash, itching or hives, swelling of the face, lips, or tongue  breathing problems  cough  low blood counts - this medicine may decrease the number of white blood cells, red blood cells, and platelets. You may be at increased risk for infections and bleeding  nausea, vomiting  pain, redness, or irritation at site where injected  pain, tingling, numbness in the hands or feet  signs and symptoms of bleeding such as bloody or black, tarry stools; red or dark brown urine; spitting up blood or brown material that looks like coffee grounds; red spots on the skin; unusual bruising or bleeding from the eyes, gums, or nose  signs and symptoms of a dangerous change in heartbeat or heart rhythm like chest pain; dizziness; fast, irregular heartbeat; palpitations; feeling faint or lightheaded; falls  signs and symptoms of infection like fever; chills; cough; sore throat; pain or trouble passing urine  signs and symptoms of liver injury like dark yellow or brown urine; general ill feeling or flu-like symptoms; light-colored stools; loss of appetite; nausea; right upper belly pain; unusually weak or tired; yellowing of the eyes or skin  signs and symptoms of low red blood cells or anemia such as unusually weak or tired; feeling faint or lightheaded; falls  signs and symptoms of muscle injury like dark urine; trouble passing urine or change in the amount of urine; unusually weak or tired; muscle pain; back pain Side effects that usually do not require medical attention (report to your doctor or health care professional if they continue or are bothersome):  changes in taste  diarrhea  gas  hair loss  loss of appetite  mouth sores This list may not describe all possible side effects. Call your doctor for medical advice about  side effects. You may report side effects to FDA at 1-800-FDA-1088. Where should I keep my medicine? This drug is given in a hospital or clinic and will not be stored at home. NOTE: This sheet is a summary. It may not cover all possible information. If you have questions about this medicine, talk to your doctor, pharmacist, or health care provider.  2020 Elsevier/Gold Standard (2018-11-17 12:20:35)  Leucovorin injection What is this medicine? LEUCOVORIN (loo koe VOR in) is used  to prevent or treat the harmful effects of some medicines. This medicine is used to treat anemia caused by a low amount of folic acid in the body. It is also used with 5-fluorouracil (5-FU) to treat colon cancer. This medicine may be used for other purposes; ask your health care provider or pharmacist if you have questions. What should I tell my health care provider before I take this medicine? They need to know if you have any of these conditions:  anemia from low levels of vitamin B-12 in the blood  an unusual or allergic reaction to leucovorin, folic acid, other medicines, foods, dyes, or preservatives  pregnant or trying to get pregnant  breast-feeding How should I use this medicine? This medicine is for injection into a muscle or into a vein. It is given by a health care professional in a hospital or clinic setting. Talk to your pediatrician regarding the use of this medicine in children. Special care may be needed. Overdosage: If you think you have taken too much of this medicine contact a poison control center or emergency room at once. NOTE: This medicine is only for you. Do not share this medicine with others. What if I miss a dose? This does not apply. What may interact with this medicine?  capecitabine  fluorouracil  phenobarbital  phenytoin  primidone  trimethoprim-sulfamethoxazole This list may not describe all possible interactions. Give your health care provider a list of all the  medicines, herbs, non-prescription drugs, or dietary supplements you use. Also tell them if you smoke, drink alcohol, or use illegal drugs. Some items may interact with your medicine. What should I watch for while using this medicine? Your condition will be monitored carefully while you are receiving this medicine. This medicine may increase the side effects of 5-fluorouracil, 5-FU. Tell your doctor or health care professional if you have diarrhea or mouth sores that do not get better or that get worse. What side effects may I notice from receiving this medicine? Side effects that you should report to your doctor or health care professional as soon as possible:  allergic reactions like skin rash, itching or hives, swelling of the face, lips, or tongue  breathing problems  fever, infection  mouth sores  unusual bleeding or bruising  unusually weak or tired Side effects that usually do not require medical attention (report to your doctor or health care professional if they continue or are bothersome):  constipation or diarrhea  loss of appetite  nausea, vomiting This list may not describe all possible side effects. Call your doctor for medical advice about side effects. You may report side effects to FDA at 1-800-FDA-1088. Where should I keep my medicine? This drug is given in a hospital or clinic and will not be stored at home. NOTE: This sheet is a summary. It may not cover all possible information. If you have questions about this medicine, talk to your doctor, pharmacist, or health care provider.  2020 Elsevier/Gold Standard (2008-01-04 16:50:29)  Fluorouracil, 5-FU injection What is this medicine? FLUOROURACIL, 5-FU (flure oh YOOR a sil) is a chemotherapy drug. It slows the growth of cancer cells. This medicine is used to treat many types of cancer like breast cancer, colon or rectal cancer, pancreatic cancer, and stomach cancer. This medicine may be used for other purposes; ask  your health care provider or pharmacist if you have questions. COMMON BRAND NAME(S): Adrucil What should I tell my health care provider before I take this medicine? They need to  know if you have any of these conditions:  blood disorders  dihydropyrimidine dehydrogenase (DPD) deficiency  infection (especially a virus infection such as chickenpox, cold sores, or herpes)  kidney disease  liver disease  malnourished, poor nutrition  recent or ongoing radiation therapy  an unusual or allergic reaction to fluorouracil, other chemotherapy, other medicines, foods, dyes, or preservatives  pregnant or trying to get pregnant  breast-feeding How should I use this medicine? This drug is given as an infusion or injection into a vein. It is administered in a hospital or clinic by a specially trained health care professional. Talk to your pediatrician regarding the use of this medicine in children. Special care may be needed. Overdosage: If you think you have taken too much of this medicine contact a poison control center or emergency room at once. NOTE: This medicine is only for you. Do not share this medicine with others. What if I miss a dose? It is important not to miss your dose. Call your doctor or health care professional if you are unable to keep an appointment. What may interact with this medicine?  allopurinol  cimetidine  dapsone  digoxin  hydroxyurea  leucovorin  levamisole  medicines for seizures like ethotoin, fosphenytoin, phenytoin  medicines to increase blood counts like filgrastim, pegfilgrastim, sargramostim  medicines that treat or prevent blood clots like warfarin, enoxaparin, and dalteparin  methotrexate  metronidazole  pyrimethamine  some other chemotherapy drugs like busulfan, cisplatin, estramustine, vinblastine  trimethoprim  trimetrexate  vaccines Talk to your doctor or health care professional before taking any of these  medicines:  acetaminophen  aspirin  ibuprofen  ketoprofen  naproxen This list may not describe all possible interactions. Give your health care provider a list of all the medicines, herbs, non-prescription drugs, or dietary supplements you use. Also tell them if you smoke, drink alcohol, or use illegal drugs. Some items may interact with your medicine. What should I watch for while using this medicine? Visit your doctor for checks on your progress. This drug may make you feel generally unwell. This is not uncommon, as chemotherapy can affect healthy cells as well as cancer cells. Report any side effects. Continue your course of treatment even though you feel ill unless your doctor tells you to stop. In some cases, you may be given additional medicines to help with side effects. Follow all directions for their use. Call your doctor or health care professional for advice if you get a fever, chills or sore throat, or other symptoms of a cold or flu. Do not treat yourself. This drug decreases your body's ability to fight infections. Try to avoid being around people who are sick. This medicine may increase your risk to bruise or bleed. Call your doctor or health care professional if you notice any unusual bleeding. Be careful brushing and flossing your teeth or using a toothpick because you may get an infection or bleed more easily. If you have any dental work done, tell your dentist you are receiving this medicine. Avoid taking products that contain aspirin, acetaminophen, ibuprofen, naproxen, or ketoprofen unless instructed by your doctor. These medicines may hide a fever. Do not become pregnant while taking this medicine. Women should inform their doctor if they wish to become pregnant or think they might be pregnant. There is a potential for serious side effects to an unborn child. Talk to your health care professional or pharmacist for more information. Do not breast-feed an infant while taking  this medicine. Men should inform  their doctor if they wish to father a child. This medicine may lower sperm counts. Do not treat diarrhea with over the counter products. Contact your doctor if you have diarrhea that lasts more than 2 days or if it is severe and watery. This medicine can make you more sensitive to the sun. Keep out of the sun. If you cannot avoid being in the sun, wear protective clothing and use sunscreen. Do not use sun lamps or tanning beds/booths. What side effects may I notice from receiving this medicine? Side effects that you should report to your doctor or health care professional as soon as possible:  allergic reactions like skin rash, itching or hives, swelling of the face, lips, or tongue  low blood counts - this medicine may decrease the number of white blood cells, red blood cells and platelets. You may be at increased risk for infections and bleeding.  signs of infection - fever or chills, cough, sore throat, pain or difficulty passing urine  signs of decreased platelets or bleeding - bruising, pinpoint red spots on the skin, black, tarry stools, blood in the urine  signs of decreased red blood cells - unusually weak or tired, fainting spells, lightheadedness  breathing problems  changes in vision  chest pain  mouth sores  nausea and vomiting  pain, swelling, redness at site where injected  pain, tingling, numbness in the hands or feet  redness, swelling, or sores on hands or feet  stomach pain  unusual bleeding Side effects that usually do not require medical attention (report to your doctor or health care professional if they continue or are bothersome):  changes in finger or toe nails  diarrhea  dry or itchy skin  hair loss  headache  loss of appetite  sensitivity of eyes to the light  stomach upset  unusually teary eyes This list may not describe all possible side effects. Call your doctor for medical advice about side effects.  You may report side effects to FDA at 1-800-FDA-1088. Where should I keep my medicine? This drug is given in a hospital or clinic and will not be stored at home. NOTE: This sheet is a summary. It may not cover all possible information. If you have questions about this medicine, talk to your doctor, pharmacist, or health care provider.  2020 Elsevier/Gold Standard (2007-11-03 13:53:16)

## 2020-02-29 ENCOUNTER — Telehealth: Payer: Self-pay | Admitting: Oncology

## 2020-02-29 DIAGNOSIS — Z483 Aftercare following surgery for neoplasm: Secondary | ICD-10-CM | POA: Diagnosis not present

## 2020-02-29 DIAGNOSIS — C779 Secondary and unspecified malignant neoplasm of lymph node, unspecified: Secondary | ICD-10-CM | POA: Diagnosis not present

## 2020-02-29 DIAGNOSIS — C786 Secondary malignant neoplasm of retroperitoneum and peritoneum: Secondary | ICD-10-CM | POA: Diagnosis not present

## 2020-02-29 DIAGNOSIS — C169 Malignant neoplasm of stomach, unspecified: Secondary | ICD-10-CM | POA: Diagnosis not present

## 2020-02-29 DIAGNOSIS — C787 Secondary malignant neoplasm of liver and intrahepatic bile duct: Secondary | ICD-10-CM | POA: Diagnosis not present

## 2020-02-29 DIAGNOSIS — J189 Pneumonia, unspecified organism: Secondary | ICD-10-CM | POA: Diagnosis not present

## 2020-02-29 DIAGNOSIS — I10 Essential (primary) hypertension: Secondary | ICD-10-CM | POA: Diagnosis not present

## 2020-02-29 DIAGNOSIS — A419 Sepsis, unspecified organism: Secondary | ICD-10-CM | POA: Diagnosis not present

## 2020-02-29 DIAGNOSIS — K567 Ileus, unspecified: Secondary | ICD-10-CM | POA: Diagnosis not present

## 2020-02-29 NOTE — Telephone Encounter (Signed)
No new appointments scheduled per 8/17 los. Patient's next appointments have already been scheduled at an earlier date.

## 2020-03-01 ENCOUNTER — Inpatient Hospital Stay: Payer: Medicare PPO

## 2020-03-01 ENCOUNTER — Other Ambulatory Visit: Payer: Self-pay

## 2020-03-01 VITALS — BP 129/72 | HR 79 | Temp 98.4°F | Resp 18

## 2020-03-01 DIAGNOSIS — C169 Malignant neoplasm of stomach, unspecified: Secondary | ICD-10-CM

## 2020-03-01 DIAGNOSIS — R140 Abdominal distension (gaseous): Secondary | ICD-10-CM | POA: Diagnosis not present

## 2020-03-01 DIAGNOSIS — I639 Cerebral infarction, unspecified: Secondary | ICD-10-CM | POA: Diagnosis not present

## 2020-03-01 DIAGNOSIS — K311 Adult hypertrophic pyloric stenosis: Secondary | ICD-10-CM | POA: Diagnosis not present

## 2020-03-01 DIAGNOSIS — I2699 Other pulmonary embolism without acute cor pulmonale: Secondary | ICD-10-CM | POA: Diagnosis not present

## 2020-03-01 MED ORDER — HEPARIN SOD (PORK) LOCK FLUSH 100 UNIT/ML IV SOLN
500.0000 [IU] | Freq: Once | INTRAVENOUS | Status: DC | PRN
  Filled 2020-03-01: qty 5

## 2020-03-01 MED ORDER — SODIUM CHLORIDE 0.9% FLUSH
10.0000 mL | INTRAVENOUS | Status: DC | PRN
  Filled 2020-03-01: qty 10

## 2020-03-02 DIAGNOSIS — C186 Malignant neoplasm of descending colon: Secondary | ICD-10-CM | POA: Diagnosis not present

## 2020-03-11 ENCOUNTER — Other Ambulatory Visit: Payer: Self-pay | Admitting: Oncology

## 2020-03-14 ENCOUNTER — Inpatient Hospital Stay: Payer: Medicare PPO | Attending: Oncology

## 2020-03-14 ENCOUNTER — Other Ambulatory Visit: Payer: Self-pay

## 2020-03-14 ENCOUNTER — Inpatient Hospital Stay: Payer: Medicare PPO

## 2020-03-14 ENCOUNTER — Inpatient Hospital Stay (HOSPITAL_BASED_OUTPATIENT_CLINIC_OR_DEPARTMENT_OTHER): Payer: Medicare PPO | Admitting: Oncology

## 2020-03-14 VITALS — BP 137/80 | HR 87 | Temp 97.1°F | Resp 18 | Ht 67.0 in | Wt 165.6 lb

## 2020-03-14 DIAGNOSIS — C169 Malignant neoplasm of stomach, unspecified: Secondary | ICD-10-CM

## 2020-03-14 DIAGNOSIS — Z79899 Other long term (current) drug therapy: Secondary | ICD-10-CM | POA: Diagnosis not present

## 2020-03-14 DIAGNOSIS — C186 Malignant neoplasm of descending colon: Secondary | ICD-10-CM | POA: Diagnosis not present

## 2020-03-14 DIAGNOSIS — D649 Anemia, unspecified: Secondary | ICD-10-CM | POA: Insufficient documentation

## 2020-03-14 DIAGNOSIS — Z5111 Encounter for antineoplastic chemotherapy: Secondary | ICD-10-CM | POA: Insufficient documentation

## 2020-03-14 DIAGNOSIS — Z5112 Encounter for antineoplastic immunotherapy: Secondary | ICD-10-CM | POA: Diagnosis not present

## 2020-03-14 DIAGNOSIS — Z8673 Personal history of transient ischemic attack (TIA), and cerebral infarction without residual deficits: Secondary | ICD-10-CM | POA: Diagnosis not present

## 2020-03-14 DIAGNOSIS — C787 Secondary malignant neoplasm of liver and intrahepatic bile duct: Secondary | ICD-10-CM | POA: Diagnosis not present

## 2020-03-14 LAB — CBC WITH DIFFERENTIAL (CANCER CENTER ONLY)
Abs Immature Granulocytes: 0.02 10*3/uL (ref 0.00–0.07)
Basophils Absolute: 0 10*3/uL (ref 0.0–0.1)
Basophils Relative: 1 %
Eosinophils Absolute: 0 10*3/uL (ref 0.0–0.5)
Eosinophils Relative: 1 %
HCT: 30.1 % — ABNORMAL LOW (ref 39.0–52.0)
Hemoglobin: 9.1 g/dL — ABNORMAL LOW (ref 13.0–17.0)
Immature Granulocytes: 0 %
Lymphocytes Relative: 15 %
Lymphs Abs: 0.7 10*3/uL (ref 0.7–4.0)
MCH: 25.9 pg — ABNORMAL LOW (ref 26.0–34.0)
MCHC: 30.2 g/dL (ref 30.0–36.0)
MCV: 85.8 fL (ref 80.0–100.0)
Monocytes Absolute: 0.6 10*3/uL (ref 0.1–1.0)
Monocytes Relative: 12 %
Neutro Abs: 3.3 10*3/uL (ref 1.7–7.7)
Neutrophils Relative %: 71 %
Platelet Count: 168 10*3/uL (ref 150–400)
RBC: 3.51 MIL/uL — ABNORMAL LOW (ref 4.22–5.81)
RDW: 17.5 % — ABNORMAL HIGH (ref 11.5–15.5)
WBC Count: 4.7 10*3/uL (ref 4.0–10.5)
nRBC: 0 % (ref 0.0–0.2)

## 2020-03-14 LAB — CMP (CANCER CENTER ONLY)
ALT: 15 U/L (ref 0–44)
AST: 30 U/L (ref 15–41)
Albumin: 3.1 g/dL — ABNORMAL LOW (ref 3.5–5.0)
Alkaline Phosphatase: 98 U/L (ref 38–126)
Anion gap: 5 (ref 5–15)
BUN: 12 mg/dL (ref 8–23)
CO2: 29 mmol/L (ref 22–32)
Calcium: 10 mg/dL (ref 8.9–10.3)
Chloride: 105 mmol/L (ref 98–111)
Creatinine: 0.76 mg/dL (ref 0.61–1.24)
GFR, Est AFR Am: >60 mL/min (ref >60)
GFR, Estimated: >60 mL/min (ref >60)
Glucose, Bld: 170 mg/dL — ABNORMAL HIGH (ref 70–99)
Potassium: 3.5 mmol/L (ref 3.5–5.1)
Sodium: 139 mmol/L (ref 135–145)
Total Bilirubin: 0.6 mg/dL (ref 0.3–1.2)
Total Protein: 6.7 g/dL (ref 6.5–8.1)

## 2020-03-14 LAB — CEA (IN HOUSE-CHCC): CEA (CHCC-In House): 14.4 ng/mL — ABNORMAL HIGH (ref 0.00–5.00)

## 2020-03-14 MED ORDER — PALONOSETRON HCL INJECTION 0.25 MG/5ML
INTRAVENOUS | Status: AC
  Filled 2020-03-14: qty 5

## 2020-03-14 MED ORDER — DIPHENHYDRAMINE HCL 25 MG PO CAPS
50.0000 mg | ORAL_CAPSULE | Freq: Once | ORAL | Status: AC
  Administered 2020-03-14: 50 mg via ORAL

## 2020-03-14 MED ORDER — LEUCOVORIN CALCIUM INJECTION 350 MG
300.0000 mg/m2 | Freq: Once | INTRAVENOUS | Status: AC
  Administered 2020-03-14: 604 mg via INTRAVENOUS
  Filled 2020-03-14: qty 30.2

## 2020-03-14 MED ORDER — ACETAMINOPHEN 325 MG PO TABS
ORAL_TABLET | ORAL | Status: AC
  Filled 2020-03-14: qty 2

## 2020-03-14 MED ORDER — FLUOROURACIL CHEMO INJECTION 2.5 GM/50ML
300.0000 mg/m2 | Freq: Once | INTRAVENOUS | Status: AC
  Administered 2020-03-14: 600 mg via INTRAVENOUS
  Filled 2020-03-14: qty 12

## 2020-03-14 MED ORDER — DEXTROSE 5 % IV SOLN
Freq: Once | INTRAVENOUS | Status: AC
  Filled 2020-03-14: qty 250

## 2020-03-14 MED ORDER — TRASTUZUMAB-ANNS CHEMO 150 MG IV SOLR
300.0000 mg | Freq: Once | INTRAVENOUS | Status: AC
  Administered 2020-03-14: 300 mg via INTRAVENOUS
  Filled 2020-03-14: qty 14.29

## 2020-03-14 MED ORDER — PALONOSETRON HCL INJECTION 0.25 MG/5ML
0.2500 mg | Freq: Once | INTRAVENOUS | Status: AC
  Administered 2020-03-14: 0.25 mg via INTRAVENOUS

## 2020-03-14 MED ORDER — SODIUM CHLORIDE 0.9 % IV SOLN
200.0000 mg | Freq: Once | INTRAVENOUS | Status: AC
  Administered 2020-03-14: 200 mg via INTRAVENOUS
  Filled 2020-03-14: qty 8

## 2020-03-14 MED ORDER — ACETAMINOPHEN 325 MG PO TABS
650.0000 mg | ORAL_TABLET | Freq: Once | ORAL | Status: AC
  Administered 2020-03-14: 650 mg via ORAL

## 2020-03-14 MED ORDER — SODIUM CHLORIDE 0.9 % IV SOLN
10.0000 mg | Freq: Once | INTRAVENOUS | Status: AC
  Administered 2020-03-14: 10 mg via INTRAVENOUS
  Filled 2020-03-14: qty 1

## 2020-03-14 MED ORDER — SODIUM CHLORIDE 0.9 % IV SOLN
2000.0000 mg/m2 | INTRAVENOUS | Status: DC
  Administered 2020-03-14: 4000 mg via INTRAVENOUS
  Filled 2020-03-14: qty 80

## 2020-03-14 MED ORDER — OXALIPLATIN CHEMO INJECTION 100 MG/20ML
85.0000 mg/m2 | Freq: Once | INTRAVENOUS | Status: AC
  Administered 2020-03-14: 170 mg via INTRAVENOUS
  Filled 2020-03-14: qty 34

## 2020-03-14 MED ORDER — DIPHENHYDRAMINE HCL 25 MG PO CAPS
ORAL_CAPSULE | ORAL | Status: AC
  Filled 2020-03-14: qty 2

## 2020-03-14 MED ORDER — SODIUM CHLORIDE 0.9 % IV SOLN
INTRAVENOUS | Status: DC
  Filled 2020-03-14: qty 250

## 2020-03-14 NOTE — Patient Instructions (Signed)
[facility] Discharge Instructions for Patients Receiving Chemotherapy  Today you received the following chemotherapy agents: pembrolizumab Beryle Flock), trastuzumab (kanjinti), oxalipatin (eloxatin), leucovorin, and fluorouracil (adrucil).  To help prevent nausea and vomiting after your treatment, we encourage you to take your nausea medication as directed.   If you develop nausea and vomiting that is not controlled by your nausea medication, call the clinic.   BELOW ARE SYMPTOMS THAT SHOULD BE REPORTED IMMEDIATELY:  *FEVER GREATER THAN 100.5 F  *CHILLS WITH OR WITHOUT FEVER  NAUSEA AND VOMITING THAT IS NOT CONTROLLED WITH YOUR NAUSEA MEDICATION  *UNUSUAL SHORTNESS OF BREATH  *UNUSUAL BRUISING OR BLEEDING  TENDERNESS IN MOUTH AND THROAT WITH OR WITHOUT PRESENCE OF ULCERS  *URINARY PROBLEMS  *BOWEL PROBLEMS  UNUSUAL RASH Items with * indicate a potential emergency and should be followed up as soon as possible.  Feel free to call the clinic should you have any questions or concerns. The clinic phone number is [phone].  Please show the [facility] at check-in to the Emergency Department and triage nurse.

## 2020-03-14 NOTE — Progress Notes (Signed)
[facility] OFFICE PROGRESS NOTE   Diagnosis: Gastric cancer  INTERVAL HISTORY:   Mr. Bakos returns as scheduled.  He completed another cycle of FOLFOX and trastuzumab on 02/28/2020.  No nausea/vomiting, rash, or diarrhea.  He had cold sensitivity for a few days following chemotherapy.  No neuropathy symptoms at present.  No bleeding.  He has malaise.  Objective:  Vital signs in last 24 hours:  Blood pressure 137/80, pulse 87, temperature (!) 97.1 F (36.2 C), temperature source Axillary, resp. rate 18, height _0  (1.702 m), weight 165 lb 9.6 oz (75.1 kg), SpO2 100 %.    HEENT: No thrush or ulcers Resp: Lungs clear bilaterally Cardio: Regular rate and rhythm GI: No hepatosplenomegaly, nontender, firm abdominal tubular fullness in the right subcostal region Vascular: No leg edema  Skin: Palms without erythema  Portacath/PICC-without erythema  Lab Results:  Lab Results  Component Value Date   WBC 4.7 03/14/2020   HGB 9.1 (L) 03/14/2020   HCT 30.1 (L) 03/14/2020   MCV 85.8 03/14/2020   PLT 168 03/14/2020   NEUTROABS 3.3 03/14/2020    CMP  Lab Results  Component Value Date   NA 140 02/28/2020   K 3.6 02/28/2020   CL 105 02/28/2020   CO2 28 02/28/2020   GLUCOSE 157 (H) 02/28/2020   BUN 9 02/28/2020   CREATININE 0.79 02/28/2020   CALCIUM 9.8 02/28/2020   PROT 6.8 02/28/2020   ALBUMIN 3.1 (L) 02/28/2020   AST 25 02/28/2020   ALT 15 02/28/2020   ALKPHOS 100 02/28/2020   BILITOT 0.4 02/28/2020   GFRNONAA >60 02/28/2020   GFRAA >60 02/28/2020    Lab Results  Component Value Date   CEA1 13.66 (H) 02/08/2020    Medications: I have reviewed the patient's current medications.   Assessment/Plan: 1.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  CT abdomen[facility]pelvis at [facility] 01/03/2020-masslike wall thickening of the gastric antrum, numerous liver metastases, small volume ascites, numerous peritoneal nodules, enlarged  periaortic and porta hepatis lymph nodes  Upper endoscopy 01/06/2020-large ulcer in the gastric antrum with heaped up and firm edges, Duodenal bulb largely "obliterated "by the mass, biopsy confirmed adenocarcinoma, HER-2 3+, intact mismatch repair protein expression, PD-L1: 4   CT chest 01/10/2020-acute bilateral pulmonary emboli, no lymphadenopathy or suspicious lung nodules  Baseline echo 01/17/20 at [facility], EF 60-65%  CT abdomen/pelvis 01/22/2020, gastric mass, liver metastases, peritoneal/omental nodules, ascites, right pleural effusion  Therapeutic and diagnostic paracentesis on 01/27/2020, 2.1 L removed;cytology shows malignant cells,malignant ascites  PD-L1 combined positive score: 1 (from ascites/periteoneal fluid), reported 02/01/20   Cycle 1 FOLFOX with trastuzumab and pembrolizumab, 01/31/2020  Baseline CEA 13.6 (02/08/2020)  Cycle 2 FOLFOX/trastuzumab 02/15/20  Pembrolizumab 02/22/2020  Cycle 3 FOLFOX/trastuzumab 02/28/2020  Cycle 4 FOLFOX/trastuzumab 03/14/2020  Pembrolizumab 03/14/2020  2.Nausea/vomiting secondary to #1 with gastric outlet obstruction  Gastric stent placement 01/12/2020, n/v resolved  3.Acute bilateral lower lobe pulmonary emboli on CT 01/10/2020-treated with apixaban,(givenLovenoxduring hospitalization)  4.Anemia-likely secondary to bleeding from the gastric mass  5.Status post Port-A-Cath placement 01/13/2020  6.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 Martino appears stable.  He is tolerating the  treatment well.  He will complete another cycle of FOLFOX/trastuzumab and pembrolizumab today.  He will be scheduled for a restaging CT evaluation on 03/26/2020.  He will return for FOLFOX/trastuzumab on 03/28/2020.  Mr. Portocarrero plans to obtain the Moderna Covid booster vaccine.  Betsy Coder, MD  03/14/2020  10:53 AM

## 2020-03-15 ENCOUNTER — Telehealth: Payer: Self-pay | Admitting: Oncology

## 2020-03-15 NOTE — Telephone Encounter (Signed)
Scheduled appointments per 9/1 los. Attempted to call patient, but there was no answer and voicemail was full. Put in notes to have updated calendar printed for patient at next appointment.

## 2020-03-16 ENCOUNTER — Other Ambulatory Visit: Payer: Self-pay

## 2020-03-16 ENCOUNTER — Inpatient Hospital Stay: Payer: Medicare PPO

## 2020-03-16 VITALS — BP 137/70 | HR 88 | Temp 98.5°F | Resp 16

## 2020-03-16 DIAGNOSIS — C169 Malignant neoplasm of stomach, unspecified: Secondary | ICD-10-CM | POA: Diagnosis not present

## 2020-03-16 DIAGNOSIS — Z79899 Other long term (current) drug therapy: Secondary | ICD-10-CM | POA: Diagnosis not present

## 2020-03-16 DIAGNOSIS — Z5111 Encounter for antineoplastic chemotherapy: Secondary | ICD-10-CM | POA: Diagnosis not present

## 2020-03-16 DIAGNOSIS — Z8673 Personal history of transient ischemic attack (TIA), and cerebral infarction without residual deficits: Secondary | ICD-10-CM | POA: Diagnosis not present

## 2020-03-16 DIAGNOSIS — C787 Secondary malignant neoplasm of liver and intrahepatic bile duct: Secondary | ICD-10-CM | POA: Diagnosis not present

## 2020-03-16 DIAGNOSIS — Z5112 Encounter for antineoplastic immunotherapy: Secondary | ICD-10-CM | POA: Diagnosis not present

## 2020-03-16 DIAGNOSIS — D649 Anemia, unspecified: Secondary | ICD-10-CM | POA: Diagnosis not present

## 2020-03-16 MED ORDER — SODIUM CHLORIDE 0.9% FLUSH
10.0000 mL | INTRAVENOUS | Status: DC | PRN
  Administered 2020-03-16: 10 mL
  Filled 2020-03-16: qty 10

## 2020-03-16 MED ORDER — HEPARIN SOD (PORK) LOCK FLUSH 100 UNIT/ML IV SOLN
500.0000 [IU] | Freq: Once | INTRAVENOUS | Status: AC | PRN
  Administered 2020-03-16: 500 [IU]
  Filled 2020-03-16: qty 5

## 2020-03-25 ENCOUNTER — Other Ambulatory Visit: Payer: Self-pay | Admitting: Oncology

## 2020-03-26 ENCOUNTER — Inpatient Hospital Stay: Payer: Medicare PPO

## 2020-03-26 ENCOUNTER — Other Ambulatory Visit: Payer: Self-pay

## 2020-03-26 ENCOUNTER — Other Ambulatory Visit: Payer: Medicare PPO

## 2020-03-26 DIAGNOSIS — Z5112 Encounter for antineoplastic immunotherapy: Secondary | ICD-10-CM | POA: Diagnosis not present

## 2020-03-26 DIAGNOSIS — D649 Anemia, unspecified: Secondary | ICD-10-CM | POA: Diagnosis not present

## 2020-03-26 DIAGNOSIS — Z8673 Personal history of transient ischemic attack (TIA), and cerebral infarction without residual deficits: Secondary | ICD-10-CM | POA: Diagnosis not present

## 2020-03-26 DIAGNOSIS — Z5111 Encounter for antineoplastic chemotherapy: Secondary | ICD-10-CM | POA: Diagnosis not present

## 2020-03-26 DIAGNOSIS — Z79899 Other long term (current) drug therapy: Secondary | ICD-10-CM | POA: Diagnosis not present

## 2020-03-26 DIAGNOSIS — Z95828 Presence of other vascular implants and grafts: Secondary | ICD-10-CM

## 2020-03-26 DIAGNOSIS — C787 Secondary malignant neoplasm of liver and intrahepatic bile duct: Secondary | ICD-10-CM | POA: Diagnosis not present

## 2020-03-26 DIAGNOSIS — C169 Malignant neoplasm of stomach, unspecified: Secondary | ICD-10-CM

## 2020-03-26 LAB — CBC WITH DIFFERENTIAL (CANCER CENTER ONLY)
Abs Immature Granulocytes: 0.01 10*3/uL (ref 0.00–0.07)
Basophils Absolute: 0 10*3/uL (ref 0.0–0.1)
Basophils Relative: 1 %
Eosinophils Absolute: 0.1 10*3/uL (ref 0.0–0.5)
Eosinophils Relative: 2 %
HCT: 30 % — ABNORMAL LOW (ref 39.0–52.0)
Hemoglobin: 9.2 g/dL — ABNORMAL LOW (ref 13.0–17.0)
Immature Granulocytes: 0 %
Lymphocytes Relative: 31 %
Lymphs Abs: 1.1 10*3/uL (ref 0.7–4.0)
MCH: 25.9 pg — ABNORMAL LOW (ref 26.0–34.0)
MCHC: 30.7 g/dL (ref 30.0–36.0)
MCV: 84.5 fL (ref 80.0–100.0)
Monocytes Absolute: 0.3 10*3/uL (ref 0.1–1.0)
Monocytes Relative: 9 %
Neutro Abs: 2 10*3/uL (ref 1.7–7.7)
Neutrophils Relative %: 57 %
Platelet Count: 205 10*3/uL (ref 150–400)
RBC: 3.55 MIL/uL — ABNORMAL LOW (ref 4.22–5.81)
RDW: 17.5 % — ABNORMAL HIGH (ref 11.5–15.5)
WBC Count: 3.6 10*3/uL — ABNORMAL LOW (ref 4.0–10.5)
nRBC: 0 % (ref 0.0–0.2)

## 2020-03-26 LAB — CMP (CANCER CENTER ONLY)
ALT: 21 U/L (ref 0–44)
AST: 30 U/L (ref 15–41)
Albumin: 3.2 g/dL — ABNORMAL LOW (ref 3.5–5.0)
Alkaline Phosphatase: 100 U/L (ref 38–126)
Anion gap: 8 (ref 5–15)
BUN: 9 mg/dL (ref 8–23)
CO2: 27 mmol/L (ref 22–32)
Calcium: 9.5 mg/dL (ref 8.9–10.3)
Chloride: 107 mmol/L (ref 98–111)
Creatinine: 0.81 mg/dL (ref 0.61–1.24)
GFR, Est AFR Am: >60 mL/min (ref >60)
GFR, Estimated: >60 mL/min (ref >60)
Glucose, Bld: 147 mg/dL — ABNORMAL HIGH (ref 70–99)
Potassium: 3.6 mmol/L (ref 3.5–5.1)
Sodium: 142 mmol/L (ref 135–145)
Total Bilirubin: 0.5 mg/dL (ref 0.3–1.2)
Total Protein: 6.7 g/dL (ref 6.5–8.1)

## 2020-03-26 LAB — CEA (IN HOUSE-CHCC): CEA (CHCC-In House): 12.4 ng/mL — ABNORMAL HIGH (ref 0.00–5.00)

## 2020-03-26 MED ORDER — SODIUM CHLORIDE 0.9% FLUSH
10.0000 mL | Freq: Once | INTRAVENOUS | Status: AC
  Administered 2020-03-26: 10 mL
  Filled 2020-03-26: qty 10

## 2020-03-26 NOTE — Patient Instructions (Signed)

## 2020-03-27 ENCOUNTER — Ambulatory Visit (HOSPITAL_COMMUNITY)
Admission: RE | Admit: 2020-03-27 | Discharge: 2020-03-27 | Disposition: A | Discharge status: Home / Self Care | Payer: Medicare PPO | Source: Ambulatory Visit | Attending: Oncology | Admitting: Oncology

## 2020-03-27 ENCOUNTER — Encounter (HOSPITAL_COMMUNITY): Payer: Self-pay

## 2020-03-27 DIAGNOSIS — C169 Malignant neoplasm of stomach, unspecified: Secondary | ICD-10-CM | POA: Insufficient documentation

## 2020-03-27 HISTORY — DX: Malignant (primary) neoplasm, unspecified: C80.1

## 2020-03-27 MED ORDER — IOHEXOL 300 MG/ML  SOLN
100.0000 mL | Freq: Once | INTRAMUSCULAR | Status: AC | PRN
  Administered 2020-03-27: 100 mL via INTRAVENOUS

## 2020-03-27 MED ORDER — HEPARIN SOD (PORK) LOCK FLUSH 100 UNIT/ML IV SOLN
500.0000 [IU] | Freq: Once | INTRAVENOUS | Status: AC
  Administered 2020-03-27: 500 [IU] via INTRAVENOUS

## 2020-03-27 MED ORDER — HEPARIN SOD (PORK) LOCK FLUSH 100 UNIT/ML IV SOLN
INTRAVENOUS | Status: AC
  Filled 2020-03-27: qty 5

## 2020-03-28 ENCOUNTER — Inpatient Hospital Stay: Payer: Medicare PPO

## 2020-03-28 ENCOUNTER — Other Ambulatory Visit: Payer: Self-pay

## 2020-03-28 ENCOUNTER — Encounter: Payer: Self-pay | Admitting: Nurse Practitioner

## 2020-03-28 ENCOUNTER — Inpatient Hospital Stay: Payer: Medicare PPO | Admitting: Nutrition

## 2020-03-28 ENCOUNTER — Inpatient Hospital Stay (HOSPITAL_BASED_OUTPATIENT_CLINIC_OR_DEPARTMENT_OTHER): Payer: Medicare PPO | Admitting: Nurse Practitioner

## 2020-03-28 VITALS — BP 121/72 | HR 65 | Temp 97.7°F | Resp 18 | Ht 67.0 in | Wt 162.8 lb

## 2020-03-28 DIAGNOSIS — C787 Secondary malignant neoplasm of liver and intrahepatic bile duct: Secondary | ICD-10-CM | POA: Diagnosis not present

## 2020-03-28 DIAGNOSIS — C169 Malignant neoplasm of stomach, unspecified: Secondary | ICD-10-CM

## 2020-03-28 DIAGNOSIS — Z79899 Other long term (current) drug therapy: Secondary | ICD-10-CM | POA: Diagnosis not present

## 2020-03-28 DIAGNOSIS — Z5112 Encounter for antineoplastic immunotherapy: Secondary | ICD-10-CM | POA: Diagnosis not present

## 2020-03-28 DIAGNOSIS — C186 Malignant neoplasm of descending colon: Secondary | ICD-10-CM | POA: Diagnosis not present

## 2020-03-28 DIAGNOSIS — Z5111 Encounter for antineoplastic chemotherapy: Secondary | ICD-10-CM | POA: Diagnosis not present

## 2020-03-28 DIAGNOSIS — Z8673 Personal history of transient ischemic attack (TIA), and cerebral infarction without residual deficits: Secondary | ICD-10-CM | POA: Diagnosis not present

## 2020-03-28 DIAGNOSIS — D649 Anemia, unspecified: Secondary | ICD-10-CM | POA: Diagnosis not present

## 2020-03-28 MED ORDER — DIPHENHYDRAMINE HCL 25 MG PO CAPS
ORAL_CAPSULE | ORAL | Status: AC
  Filled 2020-03-28: qty 2

## 2020-03-28 MED ORDER — SODIUM CHLORIDE 0.9 % IV SOLN
INTRAVENOUS | Status: DC
  Filled 2020-03-28: qty 250

## 2020-03-28 MED ORDER — ACETAMINOPHEN 325 MG PO TABS
650.0000 mg | ORAL_TABLET | Freq: Once | ORAL | Status: AC
  Administered 2020-03-28: 650 mg via ORAL

## 2020-03-28 MED ORDER — DIPHENHYDRAMINE HCL 25 MG PO CAPS
50.0000 mg | ORAL_CAPSULE | Freq: Once | ORAL | Status: AC
  Administered 2020-03-28: 50 mg via ORAL

## 2020-03-28 MED ORDER — SODIUM CHLORIDE 0.9 % IV SOLN
10.0000 mg | Freq: Once | INTRAVENOUS | Status: AC
  Administered 2020-03-28: 10 mg via INTRAVENOUS
  Filled 2020-03-28: qty 10

## 2020-03-28 MED ORDER — FLUOROURACIL CHEMO INJECTION 2.5 GM/50ML
300.0000 mg/m2 | Freq: Once | INTRAVENOUS | Status: AC
  Administered 2020-03-28: 600 mg via INTRAVENOUS
  Filled 2020-03-28: qty 12

## 2020-03-28 MED ORDER — ACETAMINOPHEN 325 MG PO TABS
ORAL_TABLET | ORAL | Status: AC
  Filled 2020-03-28: qty 2

## 2020-03-28 MED ORDER — TRASTUZUMAB-ANNS CHEMO 150 MG IV SOLR
300.0000 mg | Freq: Once | INTRAVENOUS | Status: AC
  Administered 2020-03-28: 300 mg via INTRAVENOUS
  Filled 2020-03-28: qty 14.29

## 2020-03-28 MED ORDER — PALONOSETRON HCL INJECTION 0.25 MG/5ML
INTRAVENOUS | Status: AC
  Filled 2020-03-28: qty 5

## 2020-03-28 MED ORDER — SODIUM CHLORIDE 0.9 % IV SOLN
2000.0000 mg/m2 | INTRAVENOUS | Status: DC
  Administered 2020-03-28: 4000 mg via INTRAVENOUS
  Filled 2020-03-28: qty 80

## 2020-03-28 MED ORDER — OXALIPLATIN CHEMO INJECTION 100 MG/20ML
85.0000 mg/m2 | Freq: Once | INTRAVENOUS | Status: AC
  Administered 2020-03-28: 170 mg via INTRAVENOUS
  Filled 2020-03-28: qty 34

## 2020-03-28 MED ORDER — DEXTROSE 5 % IV SOLN
Freq: Once | INTRAVENOUS | Status: AC
  Filled 2020-03-28: qty 250

## 2020-03-28 MED ORDER — LEUCOVORIN CALCIUM INJECTION 350 MG
300.0000 mg/m2 | Freq: Once | INTRAVENOUS | Status: AC
  Administered 2020-03-28: 604 mg via INTRAVENOUS
  Filled 2020-03-28: qty 30.2

## 2020-03-28 MED ORDER — PALONOSETRON HCL INJECTION 0.25 MG/5ML
0.2500 mg | Freq: Once | INTRAVENOUS | Status: AC
  Administered 2020-03-28: 0.25 mg via INTRAVENOUS

## 2020-03-28 NOTE — Patient Instructions (Signed)
[facility] Discharge Instructions for Patients Receiving Chemotherapy  Today you received the following chemotherapy agents: trastuzumab/oxaliplatin/leucovorin/fluorouracil.  To help prevent nausea and vomiting after your treatment, we encourage you to take your nausea medication as directed.  If you develop nausea and vomiting that is not controlled by your nausea medication, call the clinic.   BELOW ARE SYMPTOMS THAT SHOULD BE REPORTED IMMEDIATELY:  *FEVER GREATER THAN 100.5 F  *CHILLS WITH OR WITHOUT FEVER  NAUSEA AND VOMITING THAT IS NOT CONTROLLED WITH YOUR NAUSEA MEDICATION  *UNUSUAL SHORTNESS OF BREATH  *UNUSUAL BRUISING OR BLEEDING  TENDERNESS IN MOUTH AND THROAT WITH OR WITHOUT PRESENCE OF ULCERS  *URINARY PROBLEMS  *BOWEL PROBLEMS  UNUSUAL RASH Items with * indicate a potential emergency and should be followed up as soon as possible.  Feel free to call the clinic should you have any questions or concerns. The clinic phone number is [phone].  Please show the [facility] at check-in to the Emergency Department and triage nurse.

## 2020-03-28 NOTE — Progress Notes (Signed)
Nutrition follow-up completed with patient during infusion for metastatic gastric cancer.  Patient's weight is stable overall and was documented as 162.8 pounds September 15. Patient denies nutrition impact symptoms. He continues to drink oral nutrition supplements and is requesting samples.  Nutrition diagnosis: Unintended weight loss is stabilized.  Intervention: Educated patient to continue oral nutrition supplements 2 bottles daily. Encouraged increased calories and protein in small frequent meals and snacks. Provided 1 complementary case of Ensure Enlive. Teach back method used.  Monitoring, patient, goals: Patient will tolerate adequate calories and protein to minimize weight loss.  Next visit: Tuesday, October 12 during infusion.  **Disclaimer: This note was dictated with voice recognition software. Similar sounding words can inadvertently be transcribed and this note may contain transcription errors which may not have been corrected upon publication of note.**

## 2020-03-28 NOTE — Progress Notes (Signed)
[facility] OFFICE PROGRESS NOTE   Diagnosis: Gastric cancer  INTERVAL HISTORY:   Drew [name] returns as scheduled.  He completed a cycle of FOLFOX/Herceptin/Keytruda 03/14/2020.  He denies nausea/vomiting.  No mouth sores.  No diarrhea.  He has persistent cold sensitivity.  No numbness or tingling in the absence of cold exposure.  His [name] notes he is fatigued.  No skin rash.  No fever, cough, shortness of breath.  Objective:  Vital signs in last 24 hours:  Blood pressure 121/72, pulse 65, temperature 97.7 F (36.5 C), temperature source Tympanic, resp. rate 18, height _0  (1.702 m), weight 162 lb 12.8 oz (73.8 kg), SpO2 100 %.    HEENT: No thrush or ulcers. Resp: Lungs clear bilaterally. Cardio: Regular rate and rhythm. GI: Abdomen soft and nontender.  No hepatomegaly. Vascular: No leg edema. Neuro: Vibratory sense mildly decreased over the fingertips per tuning fork exam. Skin: Palms without erythema. Port-A-Cath without erythema.   Lab Results:  Lab Results  Component Value Date   WBC 3.6 (L) 03/26/2020   HGB 9.2 (L) 03/26/2020   HCT 30.0 (L) 03/26/2020   MCV 84.5 03/26/2020   PLT 205 03/26/2020   NEUTROABS 2.0 03/26/2020    Imaging:  CT ABDOMEN PELVIS W CONTRAST  Addendum Date: 03/27/2020   ADDENDUM REPORT: 03/27/2020 14:06 ADDENDUM: IMPRESSION: 1. Multifocal liver metastases are again noted and appear mildly decreased in size in the interval. 2. Persistent diffuse peritoneal disease with mild decrease in size of index peritoneal lesions as detailed above. 3. Similar volume of upper abdominal ascites. 4. Small right pleural effusion is decreased in volume from previous exam. 5. Aortic Atherosclerosis (ICD10-I70.0). Electronically Signed   By: Kerby Moors M.D.   On: 03/27/2020 14:06   Result Date: 03/27/2020 CLINICAL DATA:  Primary Cancer Type: Gastric Imaging Indication: Assess response to therapy Interval therapy since last imaging? Yes Initial  Cancer Diagnosis Date: 01/03/2020; Established by: Biopsy-proven Detailed Pathology: Stage IV metastatic gastric adenocarcinoma. Primary Tumor location: Gastric antrum. Numerous liver metastases. Peritoneal/omental nodules. Surgeries: No Chemotherapy: Yes; Ongoing? Yes; Most recent administration: 03/14/2020 Immunotherapy? No Radiation therapy? No Other Cancer Therapies: Gastric stent placement 01/12/2020. EXAM: CT ABDOMEN AND PELVIS WITH CONTRAST TECHNIQUE: Multidetector CT imaging of the abdomen and pelvis was performed using the standard protocol following bolus administration of intravenous contrast. CONTRAST:  117m OMNIPAQUE IOHEXOL 300 MG/ML  SOLN COMPARISON:  Most recent CT abdomen and pelvis 02/13/2020. FINDINGS: Lower chest: Right pleural effusion appears decreased in volume from previous exam. Hepatobiliary: Multifocal liver metastases are again noted. Index lesion within lateral segment of left hepatic lobe measures 1.4 x 0.8 cm, image 21/2. This is compared with 2.0 x 1.6 cm previously. Index lesion within the dome of liver measures 1.3 x 1.0 cm, image 12/2. Previously 1.8 x 1.5 cm. Index lesion within the anterior dome measures 0.8 x 0.7 cm, image 12/2. Previously 1.1 x 0.7 cm. Posterior right hepatic lobe lesion measures 0.6 x 0.5 cm, image 25/2. Previously 1.0 x 0.7 cm. Unchanged appearance of simple appearing cyst within lateral segment of left lobe of liver measuring 1.7 cm. Similarly, there is a cyst within the inferior right hepatic lobe measuring 1.7 cm, image 29/2. Gallbladder appears partially decompressed. No biliary ductal dilatation. Pancreas: Unremarkable. No pancreatic ductal dilatation or surrounding inflammatory changes. Spleen: Normal in size without focal abnormality. Adrenals/Urinary Tract: Normal appearance of the adrenal glands. Bilateral kidney cysts. No hydronephrosis. The urinary bladder appears normal. Stomach/Bowel: Stent is identified within the  gastric antrum. Unchanged  appearance of circumferential wall thickening of the gastric antrum, image 27/2. No signs of gastric outlet obstruction. No pathologic dilatation of the large or small bowel loops. Vascular/Lymphatic: Aortic atherosclerosis. No aneurysm. Borderline enlarged retroperitoneal lymph nodes appear similar to previous exam. Index left periaortic node measures 0.7 cm, image 33/2. Previously 0.9 cm. Reproductive: Prostate is unremarkable. Other: Ascites is again identified within the upper abdomen overlying the liver. This appears similar in volume to the previous exam. Extensive peritoneal disease with omental caking is identified. Index peritoneal nodule within the right upper quadrant measures 4.5 x 1.9 cm, image 36/2. Unchanged from previous exam. Index nodule within the midline ventral abdomen measures 1.6 x 1.2 cm, image 34/2. Previously this measured 1.9 by 1.3 cm. Index peritoneal lesion within the left abdomen measures 1.7 x 1.3 cm, image 40/2. Previously 2.0 x 1.4 cm. Musculoskeletal: Similar appearance of sclerotic focus in the right iliac bone which has a benign, nonaggressive appearance. No suspicious bone lesions identified at this time. IMPRESSION: 1. Multifocal liver metastases are again noted and appear mildly decreased in size in the interval. 2. Persistent diffuse peritoneal disease with mild increase in size of index peritoneal lesions as detailed above. 3. Similar volume of upper abdominal ascites. 4. Small right pleural effusion is decreased in volume from previous exam. 5.  Aortic Atherosclerosis (ICD10-I70.0). Electronically Signed: By: Kerby Moors M.D. On: 03/27/2020 09:57    Medications: I have reviewed the patient's current medications.  Assessment/Plan: 1.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  CT abdomen[facility]pelvis at [facility] 01/03/2020-masslike wall thickening of the gastric antrum, numerous liver metastases, small volume ascites, numerous peritoneal nodules,  enlarged periaortic and porta hepatis lymph nodes  Upper endoscopy 01/06/2020-large ulcer in the gastric antrum with heaped up and firm edges, Duodenal bulb largely "obliterated "by the mass, biopsy confirmed adenocarcinoma, HER-2 3+, intact mismatch repair protein expression, PD-L1: 4   CT chest 01/10/2020-acute bilateral pulmonary emboli, no lymphadenopathy or suspicious lung nodules  Baseline echo 01/17/20 at [facility], EF 60-65%  CT abdomen/pelvis 01/22/2020, gastric mass, liver metastases, peritoneal/omental nodules, ascites, right pleural effusion  Therapeutic and diagnostic paracentesis on 01/27/2020, 2.1 L removed;cytology shows malignant cells,malignant ascites  PD-L1combined positive score: 1 (from ascites/periteoneal fluid), reported 02/01/20   Cycle 1 FOLFOX with trastuzumab and pembrolizumab, 01/31/2020  Baseline CEA 13.6 (02/08/2020)  Cycle 2 FOLFOX/trastuzumab 02/15/20  Pembrolizumab 02/22/2020  Cycle 3 FOLFOX/trastuzumab 02/28/2020  Cycle 4 FOLFOX/trastuzumab 03/14/2020  Pembrolizumab 03/14/2020  CTs 03/27/2020-multifocal liver metastases again noted, appear mildly decreased in size in the interval.  Persistent diffuse peritoneal disease with mild decrease in size of index peritoneal lesions.  Similar volume of upper abdominal ascites.  Small right pleural effusion is decreased.  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  He continues treatment with FOLFOX/trastuzumab, pembrolizumab.  Recent restaging CTs show improvement.  Dr. Benay Spice reviewed the results/images from the CT scan with Drew [name] and his [name] at today's visit and recommends continuation of the current regimen.  Drew [name] agrees with this plan.  Plan to proceed with cycle 5 FOLFOX/trastuzumab today as scheduled.  He will return for Pembrolizumab next week.  We will see him in follow-up in 2 weeks.  He will contact the office in the interim with any problems.  Patient seen with Dr. Benay Spice.    Drew [name] ANP/GNP-BC   03/28/2020  12:05 PM This was a shared visit with Drew [name].  We reviewed the restaging CT images with Drew [name].  There is no evidence of disease progression and liver lesions appear smaller.  His clinical status has improved since beginning chemotherapy.  The plan is to continue FOLFOX/Herceptin and pembrolizumab.  Drew Manson, MD

## 2020-03-29 ENCOUNTER — Other Ambulatory Visit: Payer: Self-pay | Admitting: Nurse Practitioner

## 2020-03-29 ENCOUNTER — Telehealth: Payer: Self-pay | Admitting: Nurse Practitioner

## 2020-03-29 NOTE — Telephone Encounter (Signed)
Scheduled appointments per 9/15 los. Spoke with patient's daughter who handles all of patient's appointments. She is aware of upcoming appointments dates and times.

## 2020-03-30 ENCOUNTER — Other Ambulatory Visit: Payer: Self-pay

## 2020-03-30 ENCOUNTER — Inpatient Hospital Stay: Payer: Medicare PPO

## 2020-03-30 DIAGNOSIS — Z95828 Presence of other vascular implants and grafts: Secondary | ICD-10-CM

## 2020-03-30 DIAGNOSIS — Z5111 Encounter for antineoplastic chemotherapy: Secondary | ICD-10-CM | POA: Diagnosis not present

## 2020-03-30 DIAGNOSIS — C169 Malignant neoplasm of stomach, unspecified: Secondary | ICD-10-CM | POA: Diagnosis not present

## 2020-03-30 DIAGNOSIS — Z8673 Personal history of transient ischemic attack (TIA), and cerebral infarction without residual deficits: Secondary | ICD-10-CM | POA: Diagnosis not present

## 2020-03-30 DIAGNOSIS — Z5112 Encounter for antineoplastic immunotherapy: Secondary | ICD-10-CM | POA: Diagnosis not present

## 2020-03-30 DIAGNOSIS — Z79899 Other long term (current) drug therapy: Secondary | ICD-10-CM | POA: Diagnosis not present

## 2020-03-30 DIAGNOSIS — C787 Secondary malignant neoplasm of liver and intrahepatic bile duct: Secondary | ICD-10-CM | POA: Diagnosis not present

## 2020-03-30 DIAGNOSIS — D649 Anemia, unspecified: Secondary | ICD-10-CM | POA: Diagnosis not present

## 2020-03-30 MED ORDER — HEPARIN SOD (PORK) LOCK FLUSH 100 UNIT/ML IV SOLN
500.0000 [IU] | Freq: Once | INTRAVENOUS | Status: AC
  Administered 2020-03-30: 500 [IU]
  Filled 2020-03-30: qty 5

## 2020-03-30 MED ORDER — SODIUM CHLORIDE 0.9% FLUSH
10.0000 mL | Freq: Once | INTRAVENOUS | Status: AC
  Administered 2020-03-30: 10 mL
  Filled 2020-03-30: qty 10

## 2020-03-30 NOTE — Patient Instructions (Signed)

## 2020-04-01 ENCOUNTER — Other Ambulatory Visit: Payer: Self-pay | Admitting: Oncology

## 2020-04-01 DIAGNOSIS — R140 Abdominal distension (gaseous): Secondary | ICD-10-CM | POA: Diagnosis not present

## 2020-04-01 DIAGNOSIS — C169 Malignant neoplasm of stomach, unspecified: Secondary | ICD-10-CM | POA: Diagnosis not present

## 2020-04-01 DIAGNOSIS — I2699 Other pulmonary embolism without acute cor pulmonale: Secondary | ICD-10-CM | POA: Diagnosis not present

## 2020-04-01 DIAGNOSIS — K311 Adult hypertrophic pyloric stenosis: Secondary | ICD-10-CM | POA: Diagnosis not present

## 2020-04-01 DIAGNOSIS — I639 Cerebral infarction, unspecified: Secondary | ICD-10-CM | POA: Diagnosis not present

## 2020-04-02 DIAGNOSIS — C186 Malignant neoplasm of descending colon: Secondary | ICD-10-CM | POA: Diagnosis not present

## 2020-04-04 ENCOUNTER — Inpatient Hospital Stay: Payer: Medicare PPO

## 2020-04-04 ENCOUNTER — Other Ambulatory Visit: Payer: Self-pay

## 2020-04-04 VITALS — BP 142/64 | HR 60 | Temp 97.9°F | Resp 18 | Ht 67.0 in | Wt 161.5 lb

## 2020-04-04 DIAGNOSIS — Z5111 Encounter for antineoplastic chemotherapy: Secondary | ICD-10-CM | POA: Diagnosis not present

## 2020-04-04 DIAGNOSIS — Z95828 Presence of other vascular implants and grafts: Secondary | ICD-10-CM

## 2020-04-04 DIAGNOSIS — Z79899 Other long term (current) drug therapy: Secondary | ICD-10-CM | POA: Diagnosis not present

## 2020-04-04 DIAGNOSIS — C169 Malignant neoplasm of stomach, unspecified: Secondary | ICD-10-CM

## 2020-04-04 DIAGNOSIS — C787 Secondary malignant neoplasm of liver and intrahepatic bile duct: Secondary | ICD-10-CM | POA: Diagnosis not present

## 2020-04-04 DIAGNOSIS — Z5112 Encounter for antineoplastic immunotherapy: Secondary | ICD-10-CM | POA: Diagnosis not present

## 2020-04-04 DIAGNOSIS — D649 Anemia, unspecified: Secondary | ICD-10-CM | POA: Diagnosis not present

## 2020-04-04 DIAGNOSIS — Z8673 Personal history of transient ischemic attack (TIA), and cerebral infarction without residual deficits: Secondary | ICD-10-CM | POA: Diagnosis not present

## 2020-04-04 LAB — CBC WITH DIFFERENTIAL (CANCER CENTER ONLY)
Abs Immature Granulocytes: 0.03 10*3/uL (ref 0.00–0.07)
Basophils Absolute: 0 10*3/uL (ref 0.0–0.1)
Basophils Relative: 1 %
Eosinophils Absolute: 0 10*3/uL (ref 0.0–0.5)
Eosinophils Relative: 1 %
HCT: 28.6 % — ABNORMAL LOW (ref 39.0–52.0)
Hemoglobin: 9 g/dL — ABNORMAL LOW (ref 13.0–17.0)
Immature Granulocytes: 1 %
Lymphocytes Relative: 34 %
Lymphs Abs: 1 10*3/uL (ref 0.7–4.0)
MCH: 26.6 pg (ref 26.0–34.0)
MCHC: 31.5 g/dL (ref 30.0–36.0)
MCV: 84.6 fL (ref 80.0–100.0)
Monocytes Absolute: 0.3 10*3/uL (ref 0.1–1.0)
Monocytes Relative: 11 %
Neutro Abs: 1.5 10*3/uL — ABNORMAL LOW (ref 1.7–7.7)
Neutrophils Relative %: 52 %
Platelet Count: 212 10*3/uL (ref 150–400)
RBC: 3.38 MIL/uL — ABNORMAL LOW (ref 4.22–5.81)
RDW: 17.2 % — ABNORMAL HIGH (ref 11.5–15.5)
WBC Count: 2.9 10*3/uL — ABNORMAL LOW (ref 4.0–10.5)
nRBC: 0 % (ref 0.0–0.2)

## 2020-04-04 LAB — CMP (CANCER CENTER ONLY)
ALT: 20 U/L (ref 0–44)
AST: 24 U/L (ref 15–41)
Albumin: 3.2 g/dL — ABNORMAL LOW (ref 3.5–5.0)
Alkaline Phosphatase: 88 U/L (ref 38–126)
Anion gap: 3 — ABNORMAL LOW (ref 5–15)
BUN: 10 mg/dL (ref 8–23)
CO2: 31 mmol/L (ref 22–32)
Calcium: 9.2 mg/dL (ref 8.9–10.3)
Chloride: 104 mmol/L (ref 98–111)
Creatinine: 0.72 mg/dL (ref 0.61–1.24)
GFR, Est AFR Am: >60 mL/min (ref >60)
GFR, Estimated: >60 mL/min (ref >60)
Glucose, Bld: 146 mg/dL — ABNORMAL HIGH (ref 70–99)
Potassium: 3.5 mmol/L (ref 3.5–5.1)
Sodium: 138 mmol/L (ref 135–145)
Total Bilirubin: 0.4 mg/dL (ref 0.3–1.2)
Total Protein: 6.6 g/dL (ref 6.5–8.1)

## 2020-04-04 LAB — TSH: TSH: 0.939 u[IU]/mL (ref 0.320–4.118)

## 2020-04-04 MED ORDER — SODIUM CHLORIDE 0.9 % IV SOLN
200.0000 mg | Freq: Once | INTRAVENOUS | Status: AC
  Administered 2020-04-04: 200 mg via INTRAVENOUS
  Filled 2020-04-04: qty 8

## 2020-04-04 MED ORDER — HEPARIN SOD (PORK) LOCK FLUSH 100 UNIT/ML IV SOLN
500.0000 [IU] | Freq: Once | INTRAVENOUS | Status: AC | PRN
  Administered 2020-04-04: 500 [IU]
  Filled 2020-04-04: qty 5

## 2020-04-04 MED ORDER — SODIUM CHLORIDE 0.9 % IV SOLN
Freq: Once | INTRAVENOUS | Status: AC
  Filled 2020-04-04: qty 250

## 2020-04-04 MED ORDER — SODIUM CHLORIDE 0.9% FLUSH
10.0000 mL | Freq: Once | INTRAVENOUS | Status: AC
  Administered 2020-04-04: 10 mL
  Filled 2020-04-04: qty 10

## 2020-04-04 MED ORDER — SODIUM CHLORIDE 0.9% FLUSH
10.0000 mL | INTRAVENOUS | Status: DC | PRN
  Administered 2020-04-04: 10 mL
  Filled 2020-04-04: qty 10

## 2020-04-04 NOTE — Patient Instructions (Signed)

## 2020-04-04 NOTE — Patient Instructions (Signed)
[facility] Discharge Instructions for Patients Receiving Chemotherapy  Today you received the following monoclonal antibody agents Pembrolizumab (KEYTRUDA).  To help prevent nausea and vomiting after your treatment, we encourage you to take your nausea medication as prescribed.   If you develop nausea and vomiting that is not controlled by your nausea medication, call the clinic.   BELOW ARE SYMPTOMS THAT SHOULD BE REPORTED IMMEDIATELY:  *FEVER GREATER THAN 100.5 F  *CHILLS WITH OR WITHOUT FEVER  NAUSEA AND VOMITING THAT IS NOT CONTROLLED WITH YOUR NAUSEA MEDICATION  *UNUSUAL SHORTNESS OF BREATH  *UNUSUAL BRUISING OR BLEEDING  TENDERNESS IN MOUTH AND THROAT WITH OR WITHOUT PRESENCE OF ULCERS  *URINARY PROBLEMS  *BOWEL PROBLEMS  UNUSUAL RASH Items with * indicate a potential emergency and should be followed up as soon as possible.  Feel free to call the clinic should you have any questions or concerns. The clinic phone number is [phone].  Please show the CHEMO ALERT CARD at check-in to the Emergency Department and triage nurse.   

## 2020-04-05 NOTE — Telephone Encounter (Signed)
For your review : I called the pharmacy this pt picked up a 90 day supply on 02/22/20 and should not need this medication at this time Pharmacy will reach out to pt

## 2020-04-09 ENCOUNTER — Other Ambulatory Visit: Payer: Self-pay | Admitting: Oncology

## 2020-04-10 ENCOUNTER — Inpatient Hospital Stay: Payer: Medicare PPO

## 2020-04-10 ENCOUNTER — Other Ambulatory Visit: Payer: Self-pay

## 2020-04-10 ENCOUNTER — Inpatient Hospital Stay (HOSPITAL_BASED_OUTPATIENT_CLINIC_OR_DEPARTMENT_OTHER): Payer: Medicare PPO | Admitting: Oncology

## 2020-04-10 VITALS — BP 159/81 | HR 66 | Temp 96.8°F | Resp 17 | Ht 67.0 in | Wt 165.3 lb

## 2020-04-10 DIAGNOSIS — Z79899 Other long term (current) drug therapy: Secondary | ICD-10-CM | POA: Diagnosis not present

## 2020-04-10 DIAGNOSIS — C186 Malignant neoplasm of descending colon: Secondary | ICD-10-CM | POA: Diagnosis not present

## 2020-04-10 DIAGNOSIS — C169 Malignant neoplasm of stomach, unspecified: Secondary | ICD-10-CM

## 2020-04-10 DIAGNOSIS — Z95828 Presence of other vascular implants and grafts: Secondary | ICD-10-CM

## 2020-04-10 DIAGNOSIS — D649 Anemia, unspecified: Secondary | ICD-10-CM | POA: Diagnosis not present

## 2020-04-10 DIAGNOSIS — Z5111 Encounter for antineoplastic chemotherapy: Secondary | ICD-10-CM | POA: Diagnosis not present

## 2020-04-10 DIAGNOSIS — C787 Secondary malignant neoplasm of liver and intrahepatic bile duct: Secondary | ICD-10-CM | POA: Diagnosis not present

## 2020-04-10 DIAGNOSIS — Z8673 Personal history of transient ischemic attack (TIA), and cerebral infarction without residual deficits: Secondary | ICD-10-CM | POA: Diagnosis not present

## 2020-04-10 DIAGNOSIS — Z5112 Encounter for antineoplastic immunotherapy: Secondary | ICD-10-CM | POA: Diagnosis not present

## 2020-04-10 LAB — CBC WITH DIFFERENTIAL (CANCER CENTER ONLY)
Abs Immature Granulocytes: 0.01 10*3/uL (ref 0.00–0.07)
Basophils Absolute: 0 10*3/uL (ref 0.0–0.1)
Basophils Relative: 1 %
Eosinophils Absolute: 0.1 10*3/uL (ref 0.0–0.5)
Eosinophils Relative: 1 %
HCT: 29.7 % — ABNORMAL LOW (ref 39.0–52.0)
Hemoglobin: 9.3 g/dL — ABNORMAL LOW (ref 13.0–17.0)
Immature Granulocytes: 0 %
Lymphocytes Relative: 20 %
Lymphs Abs: 0.8 10*3/uL (ref 0.7–4.0)
MCH: 26.4 pg (ref 26.0–34.0)
MCHC: 31.3 g/dL (ref 30.0–36.0)
MCV: 84.4 fL (ref 80.0–100.0)
Monocytes Absolute: 0.4 10*3/uL (ref 0.1–1.0)
Monocytes Relative: 11 %
Neutro Abs: 2.8 10*3/uL (ref 1.7–7.7)
Neutrophils Relative %: 67 %
Platelet Count: 154 10*3/uL (ref 150–400)
RBC: 3.52 MIL/uL — ABNORMAL LOW (ref 4.22–5.81)
RDW: 18 % — ABNORMAL HIGH (ref 11.5–15.5)
WBC Count: 4.2 10*3/uL (ref 4.0–10.5)
nRBC: 0 % (ref 0.0–0.2)

## 2020-04-10 LAB — CMP (CANCER CENTER ONLY)
ALT: 19 U/L (ref 0–44)
AST: 27 U/L (ref 15–41)
Albumin: 3.2 g/dL — ABNORMAL LOW (ref 3.5–5.0)
Alkaline Phosphatase: 91 U/L (ref 38–126)
Anion gap: 6 (ref 5–15)
BUN: 9 mg/dL (ref 8–23)
CO2: 29 mmol/L (ref 22–32)
Calcium: 9.3 mg/dL (ref 8.9–10.3)
Chloride: 103 mmol/L (ref 98–111)
Creatinine: 0.77 mg/dL (ref 0.61–1.24)
GFR, Est AFR Am: >60 mL/min (ref >60)
GFR, Estimated: >60 mL/min (ref >60)
Glucose, Bld: 130 mg/dL — ABNORMAL HIGH (ref 70–99)
Potassium: 3.4 mmol/L — ABNORMAL LOW (ref 3.5–5.1)
Sodium: 138 mmol/L (ref 135–145)
Total Bilirubin: 0.5 mg/dL (ref 0.3–1.2)
Total Protein: 6.6 g/dL (ref 6.5–8.1)

## 2020-04-10 MED ORDER — DEXTROSE 5 % IV SOLN
Freq: Once | INTRAVENOUS | Status: AC
  Filled 2020-04-10: qty 250

## 2020-04-10 MED ORDER — TRASTUZUMAB-ANNS CHEMO 150 MG IV SOLR
300.0000 mg | Freq: Once | INTRAVENOUS | Status: AC
  Administered 2020-04-10: 300 mg via INTRAVENOUS
  Filled 2020-04-10: qty 14.29

## 2020-04-10 MED ORDER — LEUCOVORIN CALCIUM INJECTION 350 MG
300.0000 mg/m2 | Freq: Once | INTRAVENOUS | Status: AC
  Administered 2020-04-10: 604 mg via INTRAVENOUS
  Filled 2020-04-10: qty 30.2

## 2020-04-10 MED ORDER — ACETAMINOPHEN 325 MG PO TABS
650.0000 mg | ORAL_TABLET | Freq: Once | ORAL | Status: AC
  Administered 2020-04-10: 650 mg via ORAL

## 2020-04-10 MED ORDER — FLUOROURACIL CHEMO INJECTION 2.5 GM/50ML
300.0000 mg/m2 | Freq: Once | INTRAVENOUS | Status: AC
  Administered 2020-04-10: 600 mg via INTRAVENOUS
  Filled 2020-04-10: qty 12

## 2020-04-10 MED ORDER — DIPHENHYDRAMINE HCL 25 MG PO CAPS
50.0000 mg | ORAL_CAPSULE | Freq: Once | ORAL | Status: AC
  Administered 2020-04-10: 50 mg via ORAL

## 2020-04-10 MED ORDER — SODIUM CHLORIDE 0.9 % IV SOLN
INTRAVENOUS | Status: DC
  Filled 2020-04-10: qty 250

## 2020-04-10 MED ORDER — PROCHLORPERAZINE MALEATE 10 MG PO TABS
10.0000 mg | ORAL_TABLET | Freq: Four times a day (QID) | ORAL | 2 refills | Status: DC | PRN
Start: 2020-04-10 — End: 2020-07-26

## 2020-04-10 MED ORDER — PALONOSETRON HCL INJECTION 0.25 MG/5ML
INTRAVENOUS | Status: AC
  Filled 2020-04-10: qty 5

## 2020-04-10 MED ORDER — PALONOSETRON HCL INJECTION 0.25 MG/5ML
0.2500 mg | Freq: Once | INTRAVENOUS | Status: AC
  Administered 2020-04-10: 0.25 mg via INTRAVENOUS

## 2020-04-10 MED ORDER — FEROSUL 325 (65 FE) MG PO TABS
325.0000 mg | ORAL_TABLET | Freq: Every day | ORAL | 2 refills | Status: DC
Start: 2020-04-10 — End: 2023-01-07

## 2020-04-10 MED ORDER — SODIUM CHLORIDE 0.9% FLUSH
10.0000 mL | Freq: Once | INTRAVENOUS | Status: AC
  Administered 2020-04-10: 10 mL
  Filled 2020-04-10: qty 10

## 2020-04-10 MED ORDER — SODIUM CHLORIDE 0.9 % IV SOLN
10.0000 mg | Freq: Once | INTRAVENOUS | Status: AC
  Administered 2020-04-10: 10 mg via INTRAVENOUS
  Filled 2020-04-10: qty 10

## 2020-04-10 MED ORDER — DIPHENHYDRAMINE HCL 25 MG PO CAPS
ORAL_CAPSULE | ORAL | Status: AC
  Filled 2020-04-10: qty 2

## 2020-04-10 MED ORDER — SODIUM CHLORIDE 0.9 % IV SOLN
2000.0000 mg/m2 | INTRAVENOUS | Status: DC
  Administered 2020-04-10: 4000 mg via INTRAVENOUS
  Filled 2020-04-10: qty 80

## 2020-04-10 MED ORDER — OXALIPLATIN CHEMO INJECTION 100 MG/20ML
85.0000 mg/m2 | Freq: Once | INTRAVENOUS | Status: AC
  Administered 2020-04-10: 170 mg via INTRAVENOUS
  Filled 2020-04-10: qty 34

## 2020-04-10 MED ORDER — ACETAMINOPHEN 325 MG PO TABS
ORAL_TABLET | ORAL | Status: AC
  Filled 2020-04-10: qty 2

## 2020-04-10 NOTE — Progress Notes (Signed)
[facility] OFFICE PROGRESS NOTE   Diagnosis: Gastric cancer  INTERVAL HISTORY:   Drew [name] returns as scheduled.  He completed another cycle of FOLFOX on 03/28/2020 and pembrolizumab on 04/04/2020.  No nausea, diarrhea, or bleeding.  He is constipated.  He has cold sensitivity following chemotherapy.  No neuropathy symptoms at present.  Objective:  Vital signs in last 24 hours:  Blood pressure (!) 159/81, pulse 66, temperature (!) 96.8 F (36 C), temperature source Tympanic, resp. rate 17, height _0  (1.702 m), weight 165 lb 4.8 oz (75 kg), SpO2 100 %.    HEENT: No thrush or ulcers Resp: Distant breath sounds, no respiratory distress Cardio: Regular rate and rhythm GI: Nontender, no mass, no hepatomegaly Vascular: No leg edema Neuro: Mild to moderate loss of vibratory sense at the fingertips bilaterally    Portacath/PICC-without erythema  Lab Results:  Lab Results  Component Value Date   WBC 4.2 04/10/2020   HGB 9.3 (L) 04/10/2020   HCT 29.7 (L) 04/10/2020   MCV 84.4 04/10/2020   PLT 154 04/10/2020   NEUTROABS 2.8 04/10/2020    CMP  Lab Results  Component Value Date   NA 138 04/10/2020   K 3.4 (L) 04/10/2020   CL 103 04/10/2020   CO2 29 04/10/2020   GLUCOSE 130 (H) 04/10/2020   BUN 9 04/10/2020   CREATININE 0.77 04/10/2020   CALCIUM 9.3 04/10/2020   PROT 6.6 04/10/2020   ALBUMIN 3.2 (L) 04/10/2020   AST 27 04/10/2020   ALT 19 04/10/2020   ALKPHOS 91 04/10/2020   BILITOT 0.5 04/10/2020   GFRNONAA >60 04/10/2020   GFRAA >60 04/10/2020    Lab Results  Component Value Date   CEA1 12.40 (H) 03/26/2020    Medications: I have reviewed the patient's current medications.   Assessment/Plan: .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  CT abdomen[facility]pelvis at [facility] 01/03/2020-masslike wall thickening of the gastric antrum, numerous liver metastases, small volume ascites, numerous peritoneal nodules, enlarged  periaortic and porta hepatis lymph nodes  Upper endoscopy 01/06/2020-large ulcer in the gastric antrum with heaped up and firm edges, Duodenal bulb largely "obliterated "by the mass, biopsy confirmed adenocarcinoma, HER-2 3+, intact mismatch repair protein expression, PD-L1: 4   CT chest 01/10/2020-acute bilateral pulmonary emboli, no lymphadenopathy or suspicious lung nodules  Baseline echo 01/17/20 at [facility], EF 60-65%  CT abdomen/pelvis 01/22/2020, gastric mass, liver metastases, peritoneal/omental nodules, ascites, right pleural effusion  Therapeutic and diagnostic paracentesis on 01/27/2020, 2.1 L removed;cytology shows malignant cells,malignant ascites  PD-L1combined positive score: 1 (from ascites/periteoneal fluid), reported 02/01/20   Cycle 1 FOLFOX with trastuzumab and pembrolizumab, 01/31/2020  Baseline CEA 13.6 (02/08/2020)  Cycle 2 FOLFOX/trastuzumab 02/15/20  Pembrolizumab 02/22/2020  Cycle 3 FOLFOX/trastuzumab 02/28/2020  Cycle 4 FOLFOX/trastuzumab 03/14/2020  Pembrolizumab 03/14/2020  CTs 03/27/2020-multifocal liver metastases again noted, appear mildly decreased in size in the interval.  Persistent diffuse peritoneal disease with mild decrease in size of index peritoneal lesions.  Similar volume of upper abdominal ascites.  Small right pleural effusion is decreased.  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  He is tolerating the systemic therapy well.  He will complete another treatment with FOLFOX/trastuzumab today.  He will return for an office visit and treatment in 2 weeks.  He will be referred for a restaging CT evaluation within the next 2-3 months.  Drew [name] will obtain a COVID-19 booster vaccine.  He does not wish to receive the influenza vaccine at present.  Betsy Coder, MD  04/10/2020  9:04 AM

## 2020-04-10 NOTE — Patient Instructions (Signed)
[facility] Discharge Instructions for Patients Receiving Chemotherapy  Today you received the following chemotherapy agents: trastuzumab (Kanjinti), Oxaliplatin, Leucovorin, 5FU  To help prevent nausea and vomiting after your treatment, we encourage you to take your nausea medication as directed.   If you develop nausea and vomiting that is not controlled by your nausea medication, call the clinic.   BELOW ARE SYMPTOMS THAT SHOULD BE REPORTED IMMEDIATELY:  *FEVER GREATER THAN 100.5 F  *CHILLS WITH OR WITHOUT FEVER  NAUSEA AND VOMITING THAT IS NOT CONTROLLED WITH YOUR NAUSEA MEDICATION  *UNUSUAL SHORTNESS OF BREATH  *UNUSUAL BRUISING OR BLEEDING  TENDERNESS IN MOUTH AND THROAT WITH OR WITHOUT PRESENCE OF ULCERS  *URINARY PROBLEMS  *BOWEL PROBLEMS  UNUSUAL RASH Items with * indicate a potential emergency and should be followed up as soon as possible.  Feel free to call the clinic should you have any questions or concerns. The clinic phone number is [phone].  Please show the [place] at check-in to the Emergency Department and triage nurse.

## 2020-04-11 ENCOUNTER — Telehealth: Payer: Self-pay | Admitting: Oncology

## 2020-04-11 NOTE — Telephone Encounter (Signed)
Scheduled appointments per 9/28 los. Spoke to patient who is aware of appointments times and dates.

## 2020-04-12 ENCOUNTER — Other Ambulatory Visit: Payer: Self-pay

## 2020-04-12 ENCOUNTER — Inpatient Hospital Stay: Payer: Medicare PPO

## 2020-04-12 VITALS — BP 136/73 | HR 73 | Resp 17

## 2020-04-12 DIAGNOSIS — D649 Anemia, unspecified: Secondary | ICD-10-CM | POA: Diagnosis not present

## 2020-04-12 DIAGNOSIS — Z79899 Other long term (current) drug therapy: Secondary | ICD-10-CM | POA: Diagnosis not present

## 2020-04-12 DIAGNOSIS — C169 Malignant neoplasm of stomach, unspecified: Secondary | ICD-10-CM

## 2020-04-12 DIAGNOSIS — C787 Secondary malignant neoplasm of liver and intrahepatic bile duct: Secondary | ICD-10-CM | POA: Diagnosis not present

## 2020-04-12 DIAGNOSIS — Z8673 Personal history of transient ischemic attack (TIA), and cerebral infarction without residual deficits: Secondary | ICD-10-CM | POA: Diagnosis not present

## 2020-04-12 DIAGNOSIS — Z5112 Encounter for antineoplastic immunotherapy: Secondary | ICD-10-CM | POA: Diagnosis not present

## 2020-04-12 DIAGNOSIS — Z5111 Encounter for antineoplastic chemotherapy: Secondary | ICD-10-CM | POA: Diagnosis not present

## 2020-04-12 MED ORDER — HEPARIN SOD (PORK) LOCK FLUSH 100 UNIT/ML IV SOLN
500.0000 [IU] | Freq: Once | INTRAVENOUS | Status: AC | PRN
  Administered 2020-04-12: 500 [IU]
  Filled 2020-04-12: qty 5

## 2020-04-12 MED ORDER — SODIUM CHLORIDE 0.9% FLUSH
10.0000 mL | INTRAVENOUS | Status: DC | PRN
  Administered 2020-04-12: 10 mL
  Filled 2020-04-12: qty 10

## 2020-04-17 ENCOUNTER — Inpatient Hospital Stay: Payer: Medicare PPO

## 2020-04-17 ENCOUNTER — Inpatient Hospital Stay: Payer: Medicare PPO | Attending: Oncology | Admitting: Nurse Practitioner

## 2020-04-17 ENCOUNTER — Encounter: Payer: Self-pay | Admitting: Nurse Practitioner

## 2020-04-17 ENCOUNTER — Telehealth: Payer: Self-pay | Admitting: Emergency Medicine

## 2020-04-17 ENCOUNTER — Telehealth: Payer: Self-pay | Admitting: Nurse Practitioner

## 2020-04-17 ENCOUNTER — Other Ambulatory Visit: Payer: Self-pay

## 2020-04-17 ENCOUNTER — Other Ambulatory Visit: Payer: Self-pay | Admitting: Nurse Practitioner

## 2020-04-17 VITALS — BP 161/80 | HR 81 | Temp 98.0°F | Resp 16 | Ht 67.0 in | Wt 158.3 lb

## 2020-04-17 DIAGNOSIS — Z86718 Personal history of other venous thrombosis and embolism: Secondary | ICD-10-CM | POA: Insufficient documentation

## 2020-04-17 DIAGNOSIS — Z95828 Presence of other vascular implants and grafts: Secondary | ICD-10-CM

## 2020-04-17 DIAGNOSIS — C787 Secondary malignant neoplasm of liver and intrahepatic bile duct: Secondary | ICD-10-CM | POA: Diagnosis not present

## 2020-04-17 DIAGNOSIS — Z5111 Encounter for antineoplastic chemotherapy: Secondary | ICD-10-CM | POA: Diagnosis not present

## 2020-04-17 DIAGNOSIS — D649 Anemia, unspecified: Secondary | ICD-10-CM | POA: Insufficient documentation

## 2020-04-17 DIAGNOSIS — R351 Nocturia: Secondary | ICD-10-CM | POA: Diagnosis not present

## 2020-04-17 DIAGNOSIS — R180 Malignant ascites: Secondary | ICD-10-CM | POA: Diagnosis not present

## 2020-04-17 DIAGNOSIS — Z86711 Personal history of pulmonary embolism: Secondary | ICD-10-CM | POA: Diagnosis not present

## 2020-04-17 DIAGNOSIS — C169 Malignant neoplasm of stomach, unspecified: Secondary | ICD-10-CM

## 2020-04-17 DIAGNOSIS — R197 Diarrhea, unspecified: Secondary | ICD-10-CM | POA: Diagnosis not present

## 2020-04-17 DIAGNOSIS — R112 Nausea with vomiting, unspecified: Secondary | ICD-10-CM | POA: Diagnosis not present

## 2020-04-17 DIAGNOSIS — Z79899 Other long term (current) drug therapy: Secondary | ICD-10-CM | POA: Diagnosis not present

## 2020-04-17 DIAGNOSIS — C163 Malignant neoplasm of pyloric antrum: Secondary | ICD-10-CM | POA: Diagnosis not present

## 2020-04-17 DIAGNOSIS — C786 Secondary malignant neoplasm of retroperitoneum and peritoneum: Secondary | ICD-10-CM | POA: Diagnosis not present

## 2020-04-17 DIAGNOSIS — Z5112 Encounter for antineoplastic immunotherapy: Secondary | ICD-10-CM | POA: Insufficient documentation

## 2020-04-17 LAB — CMP (CANCER CENTER ONLY)
ALT: 18 U/L (ref 0–44)
AST: 24 U/L (ref 15–41)
Albumin: 3.1 g/dL — ABNORMAL LOW (ref 3.5–5.0)
Alkaline Phosphatase: 89 U/L (ref 38–126)
Anion gap: 8 (ref 5–15)
BUN: 7 mg/dL — ABNORMAL LOW (ref 8–23)
CO2: 28 mmol/L (ref 22–32)
Calcium: 9.4 mg/dL (ref 8.9–10.3)
Chloride: 100 mmol/L (ref 98–111)
Creatinine: 0.8 mg/dL (ref 0.61–1.24)
GFR, Estimated: >60 mL/min (ref >60)
Glucose, Bld: 215 mg/dL — ABNORMAL HIGH (ref 70–99)
Potassium: 3.2 mmol/L — ABNORMAL LOW (ref 3.5–5.1)
Sodium: 136 mmol/L (ref 135–145)
Total Bilirubin: 0.6 mg/dL (ref 0.3–1.2)
Total Protein: 6.7 g/dL (ref 6.5–8.1)

## 2020-04-17 LAB — CBC WITH DIFFERENTIAL (CANCER CENTER ONLY)
Abs Immature Granulocytes: 0.03 10*3/uL (ref 0.00–0.07)
Basophils Absolute: 0 10*3/uL (ref 0.0–0.1)
Basophils Relative: 1 %
Eosinophils Absolute: 0 10*3/uL (ref 0.0–0.5)
Eosinophils Relative: 1 %
HCT: 29.8 % — ABNORMAL LOW (ref 39.0–52.0)
Hemoglobin: 9.4 g/dL — ABNORMAL LOW (ref 13.0–17.0)
Immature Granulocytes: 1 %
Lymphocytes Relative: 17 %
Lymphs Abs: 0.6 10*3/uL — ABNORMAL LOW (ref 0.7–4.0)
MCH: 26.3 pg (ref 26.0–34.0)
MCHC: 31.5 g/dL (ref 30.0–36.0)
MCV: 83.2 fL (ref 80.0–100.0)
Monocytes Absolute: 0.4 10*3/uL (ref 0.1–1.0)
Monocytes Relative: 12 %
Neutro Abs: 2.4 10*3/uL (ref 1.7–7.7)
Neutrophils Relative %: 68 %
Platelet Count: 180 10*3/uL (ref 150–400)
RBC: 3.58 MIL/uL — ABNORMAL LOW (ref 4.22–5.81)
RDW: 17.4 % — ABNORMAL HIGH (ref 11.5–15.5)
WBC Count: 3.5 10*3/uL — ABNORMAL LOW (ref 4.0–10.5)
nRBC: 0 % (ref 0.0–0.2)

## 2020-04-17 LAB — C DIFFICILE QUICK SCREEN W PCR REFLEX
C Diff antigen: NEGATIVE
C Diff interpretation: NOT DETECTED
C Diff toxin: NEGATIVE

## 2020-04-17 MED ORDER — HEPARIN SOD (PORK) LOCK FLUSH 100 UNIT/ML IV SOLN
500.0000 [IU] | Freq: Once | INTRAVENOUS | Status: AC
  Administered 2020-04-17: 500 [IU]
  Filled 2020-04-17: qty 5

## 2020-04-17 MED ORDER — SODIUM CHLORIDE 0.9% FLUSH
10.0000 mL | Freq: Once | INTRAVENOUS | Status: AC
  Administered 2020-04-17: 10 mL
  Filled 2020-04-17: qty 10

## 2020-04-17 MED ORDER — POTASSIUM CHLORIDE IN NACL 20-0.9 MEQ/L-% IV SOLN
Freq: Once | INTRAVENOUS | Status: AC
  Filled 2020-04-17: qty 1000

## 2020-04-17 NOTE — Telephone Encounter (Signed)
Received call from pt's daughter Magdalene River reporting that pt has had loose/runny stools for last 3 days.  Per Tene pt was constipated and used a combination of miralax, lemon water, prune juice, and stool softeners for three days after which he started having loose stools.  Pt then stopped taking these and started imodium yesterday, but continues to have runny stools and is now experiencing weakness and lethargy.  Spoke with NP Lattie Haw who agreed to see pt today for evaluation.  Tene/pt aware and are on the way for labs/NP appt.

## 2020-04-17 NOTE — Progress Notes (Addendum)
[facility] OFFICE PROGRESS NOTE   Diagnosis: Gastric cancer  INTERVAL HISTORY:   Drew [name] returns prior to scheduled follow-up.  He completed a cycle of FOLFOX and Herceptin 04/10/2020.  He developed constipation last week.  He began MiraLAX and prune juice about 5 days ago.  He developed diarrhea 2 to 3 days ago.  He estimates an average of 10 loose stools each day over the weekend and yesterday about 8 loose stools in total.  His daughter gave him a dose of Imodium with no improvement.  He saw a small amount of bright red blood on the toilet tissue yesterday.  He has had a few episodes of nausea/vomiting.  He was able to tolerate fluids this morning.  He denies fever.  No abdominal pain.  No lightheadedness or dizziness.  In general he feels weak.  His daughter reports Drew [name] is wife was recently discharged from the hospital and had diarrhea as well.  Objective:  Vital signs in last 24 hours:  Blood pressure (!) 168/78, pulse 80, temperature 98 F (36.7 C), temperature source Tympanic, resp. rate 17, height _0  (1.702 m), weight 158 lb 4.8 oz (71.8 kg), SpO2 100 %.    HEENT: No thrush or ulcers.  Mucous membranes appear moist. Resp: Distant breath sounds.  No respiratory distress. Cardio: Regular rate and rhythm. GI: Abdomen soft and nontender.  No hepatomegaly. Vascular: No leg edema.  Skin: Decreased skin turgor. Port-A-Cath without erythema.   Lab Results:  Lab Results  Component Value Date   WBC 3.5 (L) 04/17/2020   HGB 9.4 (L) 04/17/2020   HCT 29.8 (L) 04/17/2020   MCV 83.2 04/17/2020   PLT 180 04/17/2020   NEUTROABS 2.4 04/17/2020    Imaging:  No results found.  Medications: I have reviewed the patient's current medications.  Assessment/Plan: 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  CT abdomen/pelvis at [facility] 01/03/2020-masslike wall thickening of the gastric antrum, numerous liver metastases, small volume  ascites, numerous peritoneal nodules, enlarged periaortic and porta hepatis lymph nodes  Upper endoscopy 01/06/2020-large ulcer in the gastric antrum with heaped up and firm edges, Duodenal bulb largely "obliterated "by the mass, biopsy confirmed adenocarcinoma, HER-2 3+, intact mismatch repair protein expression, PD-L1: 4   CT chest 01/10/2020-acute bilateral pulmonary emboli, no lymphadenopathy or suspicious lung nodules  Baseline echo 01/17/20 at [place], EF 60-65%  CT abdomen/pelvis 01/22/2020, gastric mass, liver metastases, peritoneal/omental nodules, ascites, right pleural effusion  Therapeutic and diagnostic paracentesis on 01/27/2020, 2.1 L removed;cytology shows malignant cells,malignant ascites  PD-L1combined positive score: 1 (from ascites/periteoneal fluid), reported 02/01/20   Cycle 1 FOLFOX with trastuzumab and pembrolizumab, 01/31/2020  Baseline CEA 13.6 (02/08/2020)  Cycle 2 FOLFOX/trastuzumab 02/15/20  Pembrolizumab 02/22/2020  Cycle 3 FOLFOX/trastuzumab 02/28/2020  Cycle 4 FOLFOX/trastuzumab 03/14/2020  Pembrolizumab 03/14/2020  CTs 03/27/2020-multifocal liver metastases again noted, appear mildly decreased in size in the interval.  Persistent diffuse peritoneal disease with mild decrease in size of index peritoneal lesions.  Similar volume of upper abdominal ascites.  Small right pleural effusion is decreased.  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  Diarrhea 04/17/2020   Disposition: Drew [name] completed cycle 6 FOLFOX/Herceptin 04/10/2020.  He subsequently developed constipation, began laxatives and developed diarrhea.  He has discontinued laxatives and tried Imodium.  Loose stools have persisted.  Oral intake is poor.  He appears dehydrated.  We discussed potential etiologies including chemotherapy, immunotherapy, infection, cancer.  He will submit a stool sample for C. difficile testing. He will receive IV fluids with potassium in the office today.  We are making arrangements for IV fluids tomorrow as well.  His nexk scheduled visit is 04/24/2020.  We are available to see him sooner as needed.  Patient seen with Dr. Benay Spice.  Ned Card ANP/GNP-BC   04/17/2020  12:47 PM  This was a shared visit with Ned Card.  Drew [name] was interviewed and examined.  He presents with diarrhea and dehydration.  The diarrhea may be secondary to chemotherapy, immunotherapy, or metastatic gastric cancer.  A stool C. difficile sample returned negative.  He will return for additional intravenous fluids tomorrow.  We recommended he begin more frequent Imodium.  We will add Lomotil if the diarrhea does not improve.  Julieanne Manson, MD

## 2020-04-17 NOTE — Telephone Encounter (Signed)
Scheduled appointments per 10/5 los. Attempted to call patient using both numbers provided. No answer for either number. Left a message on mobile number with appointment date and time.

## 2020-04-18 ENCOUNTER — Inpatient Hospital Stay: Payer: Medicare PPO

## 2020-04-18 ENCOUNTER — Telehealth: Payer: Self-pay

## 2020-04-18 ENCOUNTER — Other Ambulatory Visit: Payer: Self-pay

## 2020-04-18 ENCOUNTER — Other Ambulatory Visit: Payer: Self-pay | Admitting: Nurse Practitioner

## 2020-04-18 VITALS — BP 146/104 | HR 79 | Temp 98.6°F | Resp 18

## 2020-04-18 DIAGNOSIS — C169 Malignant neoplasm of stomach, unspecified: Secondary | ICD-10-CM

## 2020-04-18 DIAGNOSIS — C163 Malignant neoplasm of pyloric antrum: Secondary | ICD-10-CM | POA: Diagnosis not present

## 2020-04-18 DIAGNOSIS — D649 Anemia, unspecified: Secondary | ICD-10-CM | POA: Diagnosis not present

## 2020-04-18 DIAGNOSIS — R180 Malignant ascites: Secondary | ICD-10-CM | POA: Diagnosis not present

## 2020-04-18 DIAGNOSIS — Z5111 Encounter for antineoplastic chemotherapy: Secondary | ICD-10-CM | POA: Diagnosis not present

## 2020-04-18 DIAGNOSIS — Z5112 Encounter for antineoplastic immunotherapy: Secondary | ICD-10-CM | POA: Diagnosis not present

## 2020-04-18 DIAGNOSIS — Z95828 Presence of other vascular implants and grafts: Secondary | ICD-10-CM

## 2020-04-18 DIAGNOSIS — C787 Secondary malignant neoplasm of liver and intrahepatic bile duct: Secondary | ICD-10-CM | POA: Diagnosis not present

## 2020-04-18 DIAGNOSIS — R112 Nausea with vomiting, unspecified: Secondary | ICD-10-CM | POA: Diagnosis not present

## 2020-04-18 DIAGNOSIS — C786 Secondary malignant neoplasm of retroperitoneum and peritoneum: Secondary | ICD-10-CM | POA: Diagnosis not present

## 2020-04-18 DIAGNOSIS — R197 Diarrhea, unspecified: Secondary | ICD-10-CM | POA: Diagnosis not present

## 2020-04-18 LAB — BASIC METABOLIC PANEL - CANCER CENTER ONLY
Anion gap: 6 (ref 5–15)
BUN: 6 mg/dL — ABNORMAL LOW (ref 8–23)
CO2: 29 mmol/L (ref 22–32)
Calcium: 9.9 mg/dL (ref 8.9–10.3)
Chloride: 102 mmol/L (ref 98–111)
Creatinine: 0.71 mg/dL (ref 0.61–1.24)
GFR, Estimated: >60 mL/min (ref >60)
Glucose, Bld: 135 mg/dL — ABNORMAL HIGH (ref 70–99)
Potassium: 3.2 mmol/L — ABNORMAL LOW (ref 3.5–5.1)
Sodium: 137 mmol/L (ref 135–145)

## 2020-04-18 MED ORDER — HEPARIN SOD (PORK) LOCK FLUSH 100 UNIT/ML IV SOLN
500.0000 [IU] | Freq: Once | INTRAVENOUS | Status: AC
  Administered 2020-04-18: 500 [IU]
  Filled 2020-04-18: qty 5

## 2020-04-18 MED ORDER — DIPHENOXYLATE-ATROPINE 2.5-0.025 MG PO TABS
ORAL_TABLET | ORAL | Status: AC
  Filled 2020-04-18: qty 2

## 2020-04-18 MED ORDER — POTASSIUM CHLORIDE IN NACL 20-0.9 MEQ/L-% IV SOLN
Freq: Once | INTRAVENOUS | Status: AC
  Filled 2020-04-18: qty 1000

## 2020-04-18 MED ORDER — SODIUM CHLORIDE 0.9% FLUSH
10.0000 mL | Freq: Once | INTRAVENOUS | Status: AC
  Administered 2020-04-18: 10 mL
  Filled 2020-04-18: qty 10

## 2020-04-18 MED ORDER — DIPHENOXYLATE-ATROPINE 2.5-0.025 MG PO TABS: 1.0000 | ORAL_TABLET | Freq: Four times a day (QID) | ORAL | 0 refills | Status: DC | PRN

## 2020-04-18 MED ORDER — DIPHENOXYLATE-ATROPINE 2.5-0.025 MG PO TABS
2.0000 | ORAL_TABLET | Freq: Once | ORAL | Status: AC
  Administered 2020-04-18: 2 via ORAL

## 2020-04-18 NOTE — Telephone Encounter (Signed)
Called in new prescription for lomotil per Lattie Haw. Patient made aware.

## 2020-04-18 NOTE — Patient Instructions (Signed)

## 2020-04-18 NOTE — Patient Instructions (Signed)

## 2020-04-18 NOTE — Telephone Encounter (Signed)
-----   Message from Owens Shark, NP sent at 04/18/2020  7:25 AM EDT ----- Please check on him while here today for IVFs; if still having diarrhea I can send a prescription in for lomotil, thanks

## 2020-04-22 ENCOUNTER — Other Ambulatory Visit: Payer: Self-pay | Admitting: Oncology

## 2020-04-24 ENCOUNTER — Other Ambulatory Visit: Payer: Self-pay

## 2020-04-24 ENCOUNTER — Inpatient Hospital Stay: Payer: Medicare PPO

## 2020-04-24 ENCOUNTER — Other Ambulatory Visit: Payer: Self-pay | Admitting: *Deleted

## 2020-04-24 ENCOUNTER — Inpatient Hospital Stay (HOSPITAL_BASED_OUTPATIENT_CLINIC_OR_DEPARTMENT_OTHER): Payer: Medicare PPO | Admitting: Oncology

## 2020-04-24 ENCOUNTER — Other Ambulatory Visit: Payer: Self-pay | Admitting: Oncology

## 2020-04-24 VITALS — BP 153/60 | HR 70 | Temp 97.9°F | Resp 16 | Ht 67.0 in | Wt 161.1 lb

## 2020-04-24 DIAGNOSIS — Z5112 Encounter for antineoplastic immunotherapy: Secondary | ICD-10-CM | POA: Diagnosis not present

## 2020-04-24 DIAGNOSIS — Z95828 Presence of other vascular implants and grafts: Secondary | ICD-10-CM

## 2020-04-24 DIAGNOSIS — C169 Malignant neoplasm of stomach, unspecified: Secondary | ICD-10-CM

## 2020-04-24 DIAGNOSIS — D649 Anemia, unspecified: Secondary | ICD-10-CM | POA: Diagnosis not present

## 2020-04-24 DIAGNOSIS — C787 Secondary malignant neoplasm of liver and intrahepatic bile duct: Secondary | ICD-10-CM | POA: Diagnosis not present

## 2020-04-24 DIAGNOSIS — C786 Secondary malignant neoplasm of retroperitoneum and peritoneum: Secondary | ICD-10-CM | POA: Diagnosis not present

## 2020-04-24 DIAGNOSIS — R197 Diarrhea, unspecified: Secondary | ICD-10-CM | POA: Diagnosis not present

## 2020-04-24 DIAGNOSIS — C186 Malignant neoplasm of descending colon: Secondary | ICD-10-CM | POA: Diagnosis not present

## 2020-04-24 DIAGNOSIS — R112 Nausea with vomiting, unspecified: Secondary | ICD-10-CM | POA: Diagnosis not present

## 2020-04-24 DIAGNOSIS — R180 Malignant ascites: Secondary | ICD-10-CM | POA: Diagnosis not present

## 2020-04-24 DIAGNOSIS — Z5111 Encounter for antineoplastic chemotherapy: Secondary | ICD-10-CM | POA: Diagnosis not present

## 2020-04-24 DIAGNOSIS — C163 Malignant neoplasm of pyloric antrum: Secondary | ICD-10-CM | POA: Diagnosis not present

## 2020-04-24 LAB — CBC WITH DIFFERENTIAL (CANCER CENTER ONLY)
Abs Immature Granulocytes: 0.02 10*3/uL (ref 0.00–0.07)
Basophils Absolute: 0 10*3/uL (ref 0.0–0.1)
Basophils Relative: 1 %
Eosinophils Absolute: 0.1 10*3/uL (ref 0.0–0.5)
Eosinophils Relative: 2 %
HCT: 27.3 % — ABNORMAL LOW (ref 39.0–52.0)
Hemoglobin: 8.6 g/dL — ABNORMAL LOW (ref 13.0–17.0)
Immature Granulocytes: 0 %
Lymphocytes Relative: 22 %
Lymphs Abs: 1 10*3/uL (ref 0.7–4.0)
MCH: 26.5 pg (ref 26.0–34.0)
MCHC: 31.5 g/dL (ref 30.0–36.0)
MCV: 84.3 fL (ref 80.0–100.0)
Monocytes Absolute: 0.5 10*3/uL (ref 0.1–1.0)
Monocytes Relative: 11 %
Neutro Abs: 3 10*3/uL (ref 1.7–7.7)
Neutrophils Relative %: 64 %
Platelet Count: 199 10*3/uL (ref 150–400)
RBC: 3.24 MIL/uL — ABNORMAL LOW (ref 4.22–5.81)
RDW: 17.6 % — ABNORMAL HIGH (ref 11.5–15.5)
WBC Count: 4.7 10*3/uL (ref 4.0–10.5)
nRBC: 0 % (ref 0.0–0.2)

## 2020-04-24 LAB — CMP (CANCER CENTER ONLY)
ALT: 17 U/L (ref 0–44)
AST: 24 U/L (ref 15–41)
Albumin: 2.8 g/dL — ABNORMAL LOW (ref 3.5–5.0)
Alkaline Phosphatase: 76 U/L (ref 38–126)
Anion gap: 4 — ABNORMAL LOW (ref 5–15)
BUN: 6 mg/dL — ABNORMAL LOW (ref 8–23)
CO2: 34 mmol/L — ABNORMAL HIGH (ref 22–32)
Calcium: 9.2 mg/dL (ref 8.9–10.3)
Chloride: 102 mmol/L (ref 98–111)
Creatinine: 0.75 mg/dL (ref 0.61–1.24)
GFR, Estimated: >60 mL/min (ref >60)
Glucose, Bld: 171 mg/dL — ABNORMAL HIGH (ref 70–99)
Potassium: 2.9 mmol/L — CL (ref 3.5–5.1)
Sodium: 140 mmol/L (ref 135–145)
Total Bilirubin: 0.4 mg/dL (ref 0.3–1.2)
Total Protein: 6.2 g/dL — ABNORMAL LOW (ref 6.5–8.1)

## 2020-04-24 LAB — CEA (IN HOUSE-CHCC): CEA (CHCC-In House): 14.92 ng/mL — ABNORMAL HIGH (ref 0.00–5.00)

## 2020-04-24 LAB — MAGNESIUM: Magnesium: 1.5 mg/dL — ABNORMAL LOW (ref 1.7–2.4)

## 2020-04-24 MED ORDER — SODIUM CHLORIDE 0.9 % IV SOLN
200.0000 mg | Freq: Once | INTRAVENOUS | Status: AC
  Administered 2020-04-24: 200 mg via INTRAVENOUS
  Filled 2020-04-24: qty 8

## 2020-04-24 MED ORDER — ACETAMINOPHEN 325 MG PO TABS
650.0000 mg | ORAL_TABLET | Freq: Once | ORAL | Status: AC
  Administered 2020-04-24: 650 mg via ORAL

## 2020-04-24 MED ORDER — POTASSIUM CHLORIDE CRYS ER 20 MEQ PO TBCR
40.0000 meq | EXTENDED_RELEASE_TABLET | Freq: Once | ORAL | Status: AC
  Administered 2020-04-24: 40 meq via ORAL

## 2020-04-24 MED ORDER — MAGNESIUM OXIDE 400 (241.3 MG) MG PO TABS: 400.0000 mg | ORAL_TABLET | Freq: Two times a day (BID) | ORAL | 1 refills | Status: DC

## 2020-04-24 MED ORDER — DEXTROSE 5 % IV SOLN
Freq: Once | INTRAVENOUS | Status: AC
  Filled 2020-04-24: qty 250

## 2020-04-24 MED ORDER — SODIUM CHLORIDE 0.9% FLUSH
10.0000 mL | Freq: Once | INTRAVENOUS | Status: AC
  Administered 2020-04-24: 10 mL
  Filled 2020-04-24: qty 10

## 2020-04-24 MED ORDER — LEUCOVORIN CALCIUM INJECTION 350 MG
300.0000 mg/m2 | Freq: Once | INTRAVENOUS | Status: AC
  Administered 2020-04-24: 604 mg via INTRAVENOUS
  Filled 2020-04-24: qty 30.2

## 2020-04-24 MED ORDER — ACETAMINOPHEN 325 MG PO TABS
ORAL_TABLET | ORAL | Status: AC
  Filled 2020-04-24: qty 2

## 2020-04-24 MED ORDER — PALONOSETRON HCL INJECTION 0.25 MG/5ML
0.2500 mg | Freq: Once | INTRAVENOUS | Status: AC
  Administered 2020-04-24: 0.25 mg via INTRAVENOUS

## 2020-04-24 MED ORDER — POTASSIUM CHLORIDE CRYS ER 20 MEQ PO TBCR
EXTENDED_RELEASE_TABLET | ORAL | Status: AC
  Filled 2020-04-24: qty 2

## 2020-04-24 MED ORDER — TAMSULOSIN HCL 0.4 MG PO CAPS: 0.4000 mg | ORAL_CAPSULE | Freq: Every day | ORAL | 1 refills | Status: DC

## 2020-04-24 MED ORDER — FLUOROURACIL CHEMO INJECTION 2.5 GM/50ML
300.0000 mg/m2 | Freq: Once | INTRAVENOUS | Status: AC
  Administered 2020-04-24: 600 mg via INTRAVENOUS
  Filled 2020-04-24: qty 12

## 2020-04-24 MED ORDER — DIPHENHYDRAMINE HCL 25 MG PO CAPS
50.0000 mg | ORAL_CAPSULE | Freq: Once | ORAL | Status: AC
  Administered 2020-04-24: 50 mg via ORAL

## 2020-04-24 MED ORDER — POTASSIUM CHLORIDE CRYS ER 20 MEQ PO TBCR: 20.0000 meq | EXTENDED_RELEASE_TABLET | Freq: Every day | ORAL | 1 refills | Status: DC

## 2020-04-24 MED ORDER — SODIUM CHLORIDE 0.9 % IV SOLN
INTRAVENOUS | Status: DC
  Filled 2020-04-24: qty 250

## 2020-04-24 MED ORDER — SODIUM CHLORIDE 0.9 % IV SOLN
2000.0000 mg/m2 | INTRAVENOUS | Status: DC
  Administered 2020-04-24: 4000 mg via INTRAVENOUS
  Filled 2020-04-24: qty 80

## 2020-04-24 MED ORDER — PALONOSETRON HCL INJECTION 0.25 MG/5ML
INTRAVENOUS | Status: AC
  Filled 2020-04-24: qty 5

## 2020-04-24 MED ORDER — TRASTUZUMAB-ANNS CHEMO 150 MG IV SOLR
300.0000 mg | Freq: Once | INTRAVENOUS | Status: AC
  Administered 2020-04-24: 300 mg via INTRAVENOUS
  Filled 2020-04-24: qty 14.29

## 2020-04-24 MED ORDER — OXALIPLATIN CHEMO INJECTION 100 MG/20ML
85.0000 mg/m2 | Freq: Once | INTRAVENOUS | Status: AC
  Administered 2020-04-24: 170 mg via INTRAVENOUS
  Filled 2020-04-24: qty 34

## 2020-04-24 MED ORDER — SODIUM CHLORIDE 0.9 % IV SOLN
10.0000 mg | Freq: Once | INTRAVENOUS | Status: AC
  Administered 2020-04-24: 10 mg via INTRAVENOUS
  Filled 2020-04-24: qty 10

## 2020-04-24 MED ORDER — DEXTROSE 5 % IV SOLN
Freq: Once | INTRAVENOUS | Status: DC
  Filled 2020-04-24: qty 250

## 2020-04-24 MED ORDER — DIPHENHYDRAMINE HCL 25 MG PO CAPS
ORAL_CAPSULE | ORAL | Status: AC
  Filled 2020-04-24: qty 2

## 2020-04-24 NOTE — Progress Notes (Signed)
[name] OFFICE PROGRESS NOTE   Diagnosis: Gastric cancer  INTERVAL HISTORY:   Drew [name] returns as scheduled.  He reports feeling better after receiving IV fluids last week.  Diarrhea has resolved.  No bleeding.  No neuropathy symptoms.  He has a chronic history of urinary frequency and nocturia.  He is now urinating 6-8 times each night.  He has not tried medical therapy for the hematuria.  He reports his prostate is enlarged.  Objective:  Vital signs in last 24 hours:  Blood pressure (!) 153/60, pulse 70, temperature 97.9 F (36.6 C), temperature source Oral, resp. rate 16, height _0  (1.702 m), weight 161 lb 1.6 oz (73.1 kg), SpO2 99 %.    HEENT: No thrush or ulcers Resp: Lungs clear bilaterally Cardio: Regular rate and rhythm GI: Nontender, no hepatomegaly, no mass Vascular: Trace pitting edema at the left greater than right lower leg Neuro: Mild to moderate loss of vibratory sense at the fingertips bilaterally Skin: No rash  Portacath/PICC-without erythema  Lab Results:  Lab Results  Component Value Date   WBC 4.7 04/24/2020   HGB 8.6 (L) 04/24/2020   HCT 27.3 (L) 04/24/2020   MCV 84.3 04/24/2020   PLT 199 04/24/2020   NEUTROABS 3.0 04/24/2020    CMP  Lab Results  Component Value Date   NA 137 04/18/2020   K 3.2 (L) 04/18/2020   CL 102 04/18/2020   CO2 29 04/18/2020   GLUCOSE 135 (H) 04/18/2020   BUN 6 (L) 04/18/2020   CREATININE 0.71 04/18/2020   CALCIUM 9.9 04/18/2020   PROT 6.7 04/17/2020   ALBUMIN 3.1 (L) 04/17/2020   AST 24 04/17/2020   ALT 18 04/17/2020   ALKPHOS 89 04/17/2020   BILITOT 0.6 04/17/2020   GFRNONAA >60 04/18/2020   GFRAA >60 04/10/2020    Lab Results  Component Value Date   CEA1 12.40 (H) 03/26/2020     Medications: I have reviewed the patient's current medications.   Assessment/Plan: 1. 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  CT abdomen[facility]pelvis at [facility]  01/03/2020-masslike wall thickening of the gastric antrum, numerous liver metastases, small volume ascites, numerous peritoneal nodules, enlarged periaortic and porta hepatis lymph nodes  Upper endoscopy 01/06/2020-large ulcer in the gastric antrum with heaped up and firm edges, Duodenal bulb largely "obliterated "by the mass, biopsy confirmed adenocarcinoma, HER-2 3+, intact mismatch repair protein expression, PD-L1: 4   CT chest 01/10/2020-acute bilateral pulmonary emboli, no lymphadenopathy or suspicious lung nodules  Baseline echo 01/17/20 at [place], EF 60-65%  CT abdomen/pelvis 01/22/2020, gastric mass, liver metastases, peritoneal/omental nodules, ascites, right pleural effusion  Therapeutic and diagnostic paracentesis on 01/27/2020, 2.1 L removed;cytology shows malignant cells,malignant ascites  PD-L1combined positive score: 1 (from ascites/periteoneal fluid), reported 02/01/20   Cycle 1 FOLFOX with trastuzumab and pembrolizumab, 01/31/2020  Baseline CEA 13.6 (02/08/2020)  Cycle 2 FOLFOX/trastuzumab 02/15/20  Pembrolizumab 02/22/2020  Cycle 3 FOLFOX/trastuzumab 02/28/2020  Cycle 4 FOLFOX/trastuzumab 03/14/2020  Pembrolizumab 03/14/2020  CTs 03/27/2020-multifocal liver metastases again noted, appear mildly decreased in size in the interval.  Persistent diffuse peritoneal disease with mild decrease in size of index peritoneal lesions.  Similar volume of upper abdominal ascites.  Small right pleural effusion is decreased.  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  Diarrhea 04/17/2020  9.  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  Diarrhea has resolved.  He feels the diarrhea was related to taking MiraLAX.  MiraLAX has been discontinued.  He has completed 6 cycles of FOLFOX/trastuzumab and 3 treatments with pembrolizumab.  He will complete cycle 7 FOLFOX trastuzumab and cycle 4 pembrolizumab today.  Drew [name] will return for an office visit and chemotherapy in 2 weeks.  He will be scheduled for a repeat echocardiogram.  He appears to have BPH symptoms.  He will begin a trial of Flomax.  Drew Coder, MD  04/24/2020  8:42 AM

## 2020-04-24 NOTE — Progress Notes (Signed)
Nutrition Follow-up:  Patient with metastatic gastric cancer. Patient receiving folfox with herceptin.    Met with patient during infusion.  Patient reports that his appetite is better.  Recently had issues with diarrhea which is now resolved.  Eating pancakes, eggs for breakfast.  Lunch is pasta and dinner is meatloaf, [place] burger, crabcakes and vegetable (greens, green beans, carrots, pinto beans, baked beans).  Drinks ensure shakes daily    Medications: reviewed  Labs: K 2.9  Anthropometrics:   Weight is 161 lb 1.6 oz today noticed decreased to 158 lb on 10/5 (diarrhea) Stable today  162 lb in Sept 15th   NUTRITION DIAGNOSIS: Unintentional weight loss stable   INTERVENTION:  Continue oral nutrition supplements as tolerated Continue high calorie, high protein foods to prevent weight loss    MONITORING, EVALUATION, GOAL: weight trends, intake   NEXT VISIT: Tuesday, November 9 during infusion  Drew [name], [place], [place] Registered Dietitian [phone] (mobile)

## 2020-04-24 NOTE — Patient Instructions (Signed)
[facility] Discharge Instructions for Patients Receiving Chemotherapy  Today you received the following chemotherapy agents: trastuzumab (Kanjinti), Keytruda, Oxaliplatin, Leucovorin, 5FU  To help prevent nausea and vomiting after your treatment, we encourage you to take your nausea medication as directed.   If you develop nausea and vomiting that is not controlled by your nausea medication, call the clinic.   BELOW ARE SYMPTOMS THAT SHOULD BE REPORTED IMMEDIATELY:  *FEVER GREATER THAN 100.5 F  *CHILLS WITH OR WITHOUT FEVER  NAUSEA AND VOMITING THAT IS NOT CONTROLLED WITH YOUR NAUSEA MEDICATION  *UNUSUAL SHORTNESS OF BREATH  *UNUSUAL BRUISING OR BLEEDING  TENDERNESS IN MOUTH AND THROAT WITH OR WITHOUT PRESENCE OF ULCERS  *URINARY PROBLEMS  *BOWEL PROBLEMS  UNUSUAL RASH Items with * indicate a potential emergency and should be followed up as soon as possible.  Feel free to call the clinic should you have any questions or concerns. The clinic phone number is [phone].  Please show the CHEMO ALERT CARD at check-in to the Emergency Department and triage nurse.   

## 2020-04-24 NOTE — Telephone Encounter (Signed)
Notified that a prescription for Magnesium has been sent to his pharmacy. He knows he has a new prescription for flomax and potassium

## 2020-04-24 NOTE — Patient Instructions (Signed)

## 2020-04-25 ENCOUNTER — Telehealth: Payer: Self-pay | Admitting: Oncology

## 2020-04-25 NOTE — Telephone Encounter (Signed)
Scheduled appointments per 10/12 los. Called patient, no answer. Left message for patient with appointment date and times.

## 2020-04-26 ENCOUNTER — Inpatient Hospital Stay: Payer: Medicare PPO

## 2020-04-26 ENCOUNTER — Ambulatory Visit (HOSPITAL_COMMUNITY): Payer: Medicare PPO | Attending: Cardiology

## 2020-04-26 ENCOUNTER — Other Ambulatory Visit: Payer: Self-pay

## 2020-04-26 VITALS — BP 149/76 | HR 74 | Temp 98.6°F | Resp 18

## 2020-04-26 DIAGNOSIS — E785 Hyperlipidemia, unspecified: Secondary | ICD-10-CM | POA: Diagnosis not present

## 2020-04-26 DIAGNOSIS — C169 Malignant neoplasm of stomach, unspecified: Secondary | ICD-10-CM

## 2020-04-26 DIAGNOSIS — I10 Essential (primary) hypertension: Secondary | ICD-10-CM | POA: Insufficient documentation

## 2020-04-26 DIAGNOSIS — Z87891 Personal history of nicotine dependence: Secondary | ICD-10-CM | POA: Insufficient documentation

## 2020-04-26 DIAGNOSIS — Z0189 Encounter for other specified special examinations: Secondary | ICD-10-CM | POA: Diagnosis not present

## 2020-04-26 LAB — ECHOCARDIOGRAM COMPLETE
Area-P 1/2: 3.19 cm2
S' Lateral: 2.7 cm

## 2020-04-26 MED ORDER — HEPARIN SOD (PORK) LOCK FLUSH 100 UNIT/ML IV SOLN
500.0000 [IU] | Freq: Once | INTRAVENOUS | Status: DC | PRN
  Filled 2020-04-26: qty 5

## 2020-04-26 MED ORDER — SODIUM CHLORIDE 0.9% FLUSH
10.0000 mL | INTRAVENOUS | Status: DC | PRN
  Filled 2020-04-26: qty 10

## 2020-04-27 MED ORDER — DEXAMETHASONE 4 MG PO TABS
ORAL_TABLET | ORAL | Status: AC
  Filled 2020-04-27: qty 5

## 2020-05-01 DIAGNOSIS — R140 Abdominal distension (gaseous): Secondary | ICD-10-CM | POA: Diagnosis not present

## 2020-05-01 DIAGNOSIS — I2699 Other pulmonary embolism without acute cor pulmonale: Secondary | ICD-10-CM | POA: Diagnosis not present

## 2020-05-01 DIAGNOSIS — I639 Cerebral infarction, unspecified: Secondary | ICD-10-CM | POA: Diagnosis not present

## 2020-05-01 DIAGNOSIS — K311 Adult hypertrophic pyloric stenosis: Secondary | ICD-10-CM | POA: Diagnosis not present

## 2020-05-01 DIAGNOSIS — C169 Malignant neoplasm of stomach, unspecified: Secondary | ICD-10-CM | POA: Diagnosis not present

## 2020-05-02 ENCOUNTER — Other Ambulatory Visit: Payer: Self-pay | Admitting: Nurse Practitioner

## 2020-05-02 DIAGNOSIS — C169 Malignant neoplasm of stomach, unspecified: Secondary | ICD-10-CM

## 2020-05-02 DIAGNOSIS — C186 Malignant neoplasm of descending colon: Secondary | ICD-10-CM | POA: Diagnosis not present

## 2020-05-06 ENCOUNTER — Other Ambulatory Visit: Payer: Self-pay | Admitting: Oncology

## 2020-05-08 ENCOUNTER — Inpatient Hospital Stay: Payer: Medicare PPO

## 2020-05-08 ENCOUNTER — Other Ambulatory Visit: Payer: Self-pay

## 2020-05-08 ENCOUNTER — Inpatient Hospital Stay (HOSPITAL_BASED_OUTPATIENT_CLINIC_OR_DEPARTMENT_OTHER): Payer: Medicare PPO | Admitting: Nurse Practitioner

## 2020-05-08 ENCOUNTER — Encounter: Payer: Self-pay | Admitting: Nurse Practitioner

## 2020-05-08 VITALS — BP 150/73 | HR 71 | Temp 97.7°F | Resp 16 | Ht 67.0 in | Wt 161.2 lb

## 2020-05-08 DIAGNOSIS — R180 Malignant ascites: Secondary | ICD-10-CM | POA: Diagnosis not present

## 2020-05-08 DIAGNOSIS — Z5111 Encounter for antineoplastic chemotherapy: Secondary | ICD-10-CM | POA: Diagnosis not present

## 2020-05-08 DIAGNOSIS — C163 Malignant neoplasm of pyloric antrum: Secondary | ICD-10-CM | POA: Diagnosis not present

## 2020-05-08 DIAGNOSIS — C186 Malignant neoplasm of descending colon: Secondary | ICD-10-CM | POA: Diagnosis not present

## 2020-05-08 DIAGNOSIS — Z5112 Encounter for antineoplastic immunotherapy: Secondary | ICD-10-CM | POA: Diagnosis not present

## 2020-05-08 DIAGNOSIS — C169 Malignant neoplasm of stomach, unspecified: Secondary | ICD-10-CM

## 2020-05-08 DIAGNOSIS — D649 Anemia, unspecified: Secondary | ICD-10-CM | POA: Diagnosis not present

## 2020-05-08 DIAGNOSIS — C787 Secondary malignant neoplasm of liver and intrahepatic bile duct: Secondary | ICD-10-CM | POA: Diagnosis not present

## 2020-05-08 DIAGNOSIS — R197 Diarrhea, unspecified: Secondary | ICD-10-CM | POA: Diagnosis not present

## 2020-05-08 DIAGNOSIS — R112 Nausea with vomiting, unspecified: Secondary | ICD-10-CM | POA: Diagnosis not present

## 2020-05-08 DIAGNOSIS — Z95828 Presence of other vascular implants and grafts: Secondary | ICD-10-CM

## 2020-05-08 DIAGNOSIS — C786 Secondary malignant neoplasm of retroperitoneum and peritoneum: Secondary | ICD-10-CM | POA: Diagnosis not present

## 2020-05-08 LAB — CBC WITH DIFFERENTIAL (CANCER CENTER ONLY)
Abs Immature Granulocytes: 0 10*3/uL (ref 0.00–0.07)
Basophils Absolute: 0 10*3/uL (ref 0.0–0.1)
Basophils Relative: 1 %
Eosinophils Absolute: 0.1 10*3/uL (ref 0.0–0.5)
Eosinophils Relative: 2 %
HCT: 27.5 % — ABNORMAL LOW (ref 39.0–52.0)
Hemoglobin: 8.3 g/dL — ABNORMAL LOW (ref 13.0–17.0)
Immature Granulocytes: 0 %
Lymphocytes Relative: 20 %
Lymphs Abs: 0.9 10*3/uL (ref 0.7–4.0)
MCH: 25.7 pg — ABNORMAL LOW (ref 26.0–34.0)
MCHC: 30.2 g/dL (ref 30.0–36.0)
MCV: 85.1 fL (ref 80.0–100.0)
Monocytes Absolute: 0.5 10*3/uL (ref 0.1–1.0)
Monocytes Relative: 11 %
Neutro Abs: 2.8 10*3/uL (ref 1.7–7.7)
Neutrophils Relative %: 66 %
Platelet Count: 180 10*3/uL (ref 150–400)
RBC: 3.23 MIL/uL — ABNORMAL LOW (ref 4.22–5.81)
RDW: 18.4 % — ABNORMAL HIGH (ref 11.5–15.5)
WBC Count: 4.2 10*3/uL (ref 4.0–10.5)
nRBC: 0 % (ref 0.0–0.2)

## 2020-05-08 LAB — CMP (CANCER CENTER ONLY)
ALT: 20 U/L (ref 0–44)
AST: 30 U/L (ref 15–41)
Albumin: 3 g/dL — ABNORMAL LOW (ref 3.5–5.0)
Alkaline Phosphatase: 85 U/L (ref 38–126)
Anion gap: 8 (ref 5–15)
BUN: 11 mg/dL (ref 8–23)
CO2: 27 mmol/L (ref 22–32)
Calcium: 9.3 mg/dL (ref 8.9–10.3)
Chloride: 104 mmol/L (ref 98–111)
Creatinine: 0.83 mg/dL (ref 0.61–1.24)
GFR, Estimated: >60 mL/min (ref >60)
Glucose, Bld: 165 mg/dL — ABNORMAL HIGH (ref 70–99)
Potassium: 3.8 mmol/L (ref 3.5–5.1)
Sodium: 139 mmol/L (ref 135–145)
Total Bilirubin: 0.4 mg/dL (ref 0.3–1.2)
Total Protein: 6.1 g/dL — ABNORMAL LOW (ref 6.5–8.1)

## 2020-05-08 MED ORDER — SODIUM CHLORIDE 0.9 % IV SOLN: 2000.0000 mg/m2 | INTRAVENOUS | Status: DC

## 2020-05-08 MED ORDER — DIPHENHYDRAMINE HCL 25 MG PO CAPS
ORAL_CAPSULE | ORAL | Status: AC
  Filled 2020-05-08: qty 2

## 2020-05-08 MED ORDER — PALONOSETRON HCL INJECTION 0.25 MG/5ML
0.2500 mg | Freq: Once | INTRAVENOUS | Status: AC
  Administered 2020-05-08: 0.25 mg via INTRAVENOUS

## 2020-05-08 MED ORDER — HEPARIN SOD (PORK) LOCK FLUSH 100 UNIT/ML IV SOLN
500.0000 [IU] | Freq: Once | INTRAVENOUS | Status: DC | PRN
  Filled 2020-05-08: qty 5

## 2020-05-08 MED ORDER — ACETAMINOPHEN 325 MG PO TABS
650.0000 mg | ORAL_TABLET | Freq: Once | ORAL | Status: AC
  Administered 2020-05-08: 650 mg via ORAL

## 2020-05-08 MED ORDER — DEXTROSE 5 % IV SOLN
Freq: Once | INTRAVENOUS | Status: AC
  Filled 2020-05-08: qty 250

## 2020-05-08 MED ORDER — ACETAMINOPHEN 325 MG PO TABS
ORAL_TABLET | ORAL | Status: AC
  Filled 2020-05-08: qty 2

## 2020-05-08 MED ORDER — SODIUM CHLORIDE 0.9% FLUSH
10.0000 mL | INTRAVENOUS | Status: DC | PRN
  Filled 2020-05-08: qty 10

## 2020-05-08 MED ORDER — DIPHENHYDRAMINE HCL 25 MG PO CAPS
50.0000 mg | ORAL_CAPSULE | Freq: Once | ORAL | Status: AC
  Administered 2020-05-08: 50 mg via ORAL

## 2020-05-08 MED ORDER — SODIUM CHLORIDE 0.9 % IV SOLN
1600.0000 mg/m2 | INTRAVENOUS | Status: DC
  Administered 2020-05-08: 3200 mg via INTRAVENOUS
  Filled 2020-05-08: qty 64

## 2020-05-08 MED ORDER — ELIQUIS 5 MG PO TABS: 5.0000 mg | ORAL_TABLET | Freq: Two times a day (BID) | ORAL | 0 refills | Status: DC

## 2020-05-08 MED ORDER — SODIUM CHLORIDE 0.9% FLUSH
10.0000 mL | Freq: Once | INTRAVENOUS | Status: AC
  Administered 2020-05-08: 10 mL
  Filled 2020-05-08: qty 10

## 2020-05-08 MED ORDER — TRASTUZUMAB-ANNS CHEMO 150 MG IV SOLR
300.0000 mg | Freq: Once | INTRAVENOUS | Status: AC
  Administered 2020-05-08: 300 mg via INTRAVENOUS
  Filled 2020-05-08: qty 14.29

## 2020-05-08 MED ORDER — PALONOSETRON HCL INJECTION 0.25 MG/5ML
INTRAVENOUS | Status: AC
  Filled 2020-05-08: qty 5

## 2020-05-08 MED ORDER — METOPROLOL SUCCINATE ER 25 MG PO TB24: 25.0000 mg | ORAL_TABLET | Freq: Every day | ORAL | 0 refills | Status: DC

## 2020-05-08 MED ORDER — PANTOPRAZOLE SODIUM 40 MG PO TBEC: 40.0000 mg | DELAYED_RELEASE_TABLET | Freq: Two times a day (BID) | ORAL | 0 refills | Status: DC

## 2020-05-08 MED ORDER — LEUCOVORIN CALCIUM INJECTION 350 MG
300.0000 mg/m2 | Freq: Once | INTRAVENOUS | Status: AC
  Administered 2020-05-08: 604 mg via INTRAVENOUS
  Filled 2020-05-08: qty 30.2

## 2020-05-08 MED ORDER — FLUOROURACIL CHEMO INJECTION 2.5 GM/50ML: 300.0000 mg/m2 | Freq: Once | INTRAVENOUS | Status: DC

## 2020-05-08 MED ORDER — SODIUM CHLORIDE 0.9 % IV SOLN
10.0000 mg | Freq: Once | INTRAVENOUS | Status: AC
  Administered 2020-05-08: 10 mg via INTRAVENOUS
  Filled 2020-05-08: qty 10

## 2020-05-08 MED ORDER — SODIUM CHLORIDE 0.9 % IV SOLN
INTRAVENOUS | Status: DC
  Filled 2020-05-08: qty 250

## 2020-05-08 MED ORDER — OXALIPLATIN CHEMO INJECTION 100 MG/20ML
85.0000 mg/m2 | Freq: Once | INTRAVENOUS | Status: AC
  Administered 2020-05-08: 170 mg via INTRAVENOUS
  Filled 2020-05-08: qty 34

## 2020-05-08 MED ORDER — MAGNESIUM OXIDE 400 (241.3 MG) MG PO TABS: 400.0000 mg | ORAL_TABLET | Freq: Two times a day (BID) | ORAL | 0 refills | Status: DC

## 2020-05-08 MED ORDER — ATORVASTATIN CALCIUM 10 MG PO TABS: 10.0000 mg | ORAL_TABLET | Freq: Every day | ORAL | 0 refills | Status: DC

## 2020-05-08 NOTE — Progress Notes (Addendum)
[facility] OFFICE PROGRESS NOTE   Diagnosis: Gastric cancer  INTERVAL HISTORY:   Mr. Vandyne returns as scheduled.  He completed a cycle of FOLFOX/Herceptin/pembrolizumab 04/24/2020.  About 4 days later after eating a pizza he developed significant diarrhea with multiple loose stools, incontinent at times.  The diarrhea lasted for about a week.  Lomotil was effective.  He is now constipated.  No nausea or vomiting.  No mouth sores.  No skin rash.  No numbness or tingling in the hands or feet.  He has noted less frequent urination during the night since beginning Flomax.  Objective:  Vital signs in last 24 hours:  Blood pressure (!) 150/73, pulse 71, temperature 97.7 F (36.5 C), temperature source Tympanic, resp. rate 16, height 5' 7" (1.702 m), weight 161 lb 3.2 oz (73.1 kg), SpO2 100 %.    HEENT: No thrush or ulcers. Resp: Lungs clear bilaterally. Cardio: Regular rate and rhythm. GI: No hepatomegaly. Vascular: Trace edema at the left lower leg. Neuro: Vibratory sense with mild decreased over the fingertips per tuning fork exam. Skin: No rash. Port-A-Cath without erythema.   Lab Results:  Lab Results  Component Value Date   WBC 4.2 05/08/2020   HGB 8.3 (L) 05/08/2020   HCT 27.5 (L) 05/08/2020   MCV 85.1 05/08/2020   PLT 180 05/08/2020   NEUTROABS 2.8 05/08/2020    Imaging:  No results found.  Medications: I have reviewed the patient's current medications.  Assessment/Plan: 1. 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  CT abdomen/pelvis at [facility] 01/03/2020-masslike wall thickening of the gastric antrum, numerous liver metastases, small volume ascites, numerous peritoneal nodules, enlarged periaortic and porta hepatis lymph nodes  Upper endoscopy 01/06/2020-large ulcer in the gastric antrum with heaped up and firm edges, Duodenal bulb largely "obliterated "by the mass, biopsy confirmed adenocarcinoma, HER-2 3+, intact  mismatch repair protein expression, PD-L1: 4   CT chest 01/10/2020-acute bilateral pulmonary emboli, no lymphadenopathy or suspicious lung nodules  Baseline echo 01/17/20 at [facility], EF 60-65%  CT abdomen/pelvis 01/22/2020, gastric mass, liver metastases, peritoneal/omental nodules, ascites, right pleural effusion  Therapeutic and diagnostic paracentesis on 01/27/2020, 2.1 L removed;cytology shows malignant cells,malignant ascites  PD-L1combined positive score: 1 (from ascites/periteoneal fluid), reported 02/01/20   Cycle 1 FOLFOX with trastuzumab and pembrolizumab, 01/31/2020  Baseline CEA 13.6 (02/08/2020)  Cycle 2 FOLFOX/trastuzumab 02/15/20  Pembrolizumab 02/22/2020  Cycle 3 FOLFOX/trastuzumab 02/28/2020  Cycle 4 FOLFOX/trastuzumab 03/14/2020  Pembrolizumab 03/14/2020  CTs 03/27/2020-multifocal liver metastases again noted, appear mildly decreased in size in the interval. Persistent diffuse peritoneal disease with mild decrease in size of index peritoneal lesions. Similar volume of upper abdominal ascites. Small right pleural effusion is decreased.  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  Diarrhea 04/17/2020  9.  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 Chervenak appears unchanged.  He has completed 7 cycles of FOLFOX/trastuzumab and 5 cycles of pembrolizumab.  Plan to proceed with cycle 8 FOLFOX/trastuzumab today as scheduled.  He will return for cycle 6 pembrolizumab in 1 week.  Plan for restaging CTs prior to his next office visit.  He again had diarrhea following treatment.  This may be related to the 5-fluorouracil.  The dose will be adjusted beginning with today's treatment.  We reviewed the CBC and chemistry panel from today.  Labs adequate to proceed with treatment.  He will return for lab, follow-up, treatment in 2 weeks.  He will contact the office in the interim with any problems.  We specifically discussed poorly controlled diarrhea.  Patient seen with Dr. Benay Spice.  Ned Card ANP/GNP-BC   05/08/2020  9:36 AM  This was a shared visit with Ned Card.  Mr. Romney again had diarrhea following last cycle of chemotherapy.  The diarrhea is most likely related to chemotherapy.  The 5-FU infusion will be dose reduced with this cycle.  The 5-FU bolus will be discontinued.  We discussed the treatment plan with Mr. Heldman and his daughter.  He will undergo restaging CTs prior to an office visit in 2 weeks.  Julieanne Manson, MD

## 2020-05-08 NOTE — Patient Instructions (Signed)
[facility]charge Instructions for Patients Receiving Chemotherapy  Today you received the following chemotherapy agents: trastuzumab (Kanjinti), Keytruda, Oxaliplatin, Leucovorin, 5FU  To help prevent nausea and vomiting after your treatment, we encourage you to take your nausea medication as directed.   If you develop nausea and vomiting that is not controlled by your nausea medication, call the clinic.   BELOW ARE SYMPTOMS THAT SHOULD BE REPORTED IMMEDIATELY:  *FEVER GREATER THAN 100.5 F  *CHILLS WITH OR WITHOUT FEVER  NAUSEA AND VOMITING THAT IS NOT CONTROLLED WITH YOUR NAUSEA MEDICATION  *UNUSUAL SHORTNESS OF BREATH  *UNUSUAL BRUISING OR BLEEDING  TENDERNESS IN MOUTH AND THROAT WITH OR WITHOUT PRESENCE OF ULCERS  *URINARY PROBLEMS  *BOWEL PROBLEMS  UNUSUAL RASH Items with * indicate a potential emergency and should be followed up as soon as possible.  Feel free to call the clinic should you have any questions or concerns. The clinic phone number is [phone].  Please show the [place] at check-in to the Emergency Department and triage nurse.

## 2020-05-08 NOTE — Progress Notes (Signed)
Per Dr. Benay Spice, d/c 5FU bolus and decr 5FU pump dose basis to 1600 mg/m2 today and going forward. Orders updated.  Kennith Center, Pharm.D., CPP 05/08/2020@10 :34 AM

## 2020-05-09 ENCOUNTER — Encounter: Payer: Self-pay | Admitting: Nurse Practitioner

## 2020-05-09 ENCOUNTER — Telehealth: Payer: Self-pay | Admitting: Nurse Practitioner

## 2020-05-09 NOTE — Telephone Encounter (Signed)
Scheduled appointment per 10/26 los. Spoke to patient who is aware of appointment date and time.  

## 2020-05-10 ENCOUNTER — Inpatient Hospital Stay: Payer: Medicare PPO

## 2020-05-10 ENCOUNTER — Other Ambulatory Visit: Payer: Self-pay

## 2020-05-10 VITALS — BP 144/82 | HR 97 | Temp 97.7°F | Resp 18

## 2020-05-10 DIAGNOSIS — C169 Malignant neoplasm of stomach, unspecified: Secondary | ICD-10-CM

## 2020-05-10 DIAGNOSIS — R197 Diarrhea, unspecified: Secondary | ICD-10-CM | POA: Diagnosis not present

## 2020-05-10 DIAGNOSIS — R180 Malignant ascites: Secondary | ICD-10-CM | POA: Diagnosis not present

## 2020-05-10 DIAGNOSIS — Z5112 Encounter for antineoplastic immunotherapy: Secondary | ICD-10-CM | POA: Diagnosis not present

## 2020-05-10 DIAGNOSIS — C786 Secondary malignant neoplasm of retroperitoneum and peritoneum: Secondary | ICD-10-CM | POA: Diagnosis not present

## 2020-05-10 DIAGNOSIS — Z5111 Encounter for antineoplastic chemotherapy: Secondary | ICD-10-CM | POA: Diagnosis not present

## 2020-05-10 DIAGNOSIS — D649 Anemia, unspecified: Secondary | ICD-10-CM | POA: Diagnosis not present

## 2020-05-10 DIAGNOSIS — R112 Nausea with vomiting, unspecified: Secondary | ICD-10-CM | POA: Diagnosis not present

## 2020-05-10 DIAGNOSIS — C163 Malignant neoplasm of pyloric antrum: Secondary | ICD-10-CM | POA: Diagnosis not present

## 2020-05-10 DIAGNOSIS — C787 Secondary malignant neoplasm of liver and intrahepatic bile duct: Secondary | ICD-10-CM | POA: Diagnosis not present

## 2020-05-10 MED ORDER — HEPARIN SOD (PORK) LOCK FLUSH 100 UNIT/ML IV SOLN
500.0000 [IU] | Freq: Once | INTRAVENOUS | Status: AC | PRN
  Administered 2020-05-10: 500 [IU]
  Filled 2020-05-10: qty 5

## 2020-05-10 MED ORDER — SODIUM CHLORIDE 0.9% FLUSH
10.0000 mL | INTRAVENOUS | Status: DC | PRN
  Administered 2020-05-10: 10 mL
  Filled 2020-05-10: qty 10

## 2020-05-11 ENCOUNTER — Other Ambulatory Visit: Payer: Self-pay | Admitting: Oncology

## 2020-05-15 ENCOUNTER — Inpatient Hospital Stay: Payer: Medicare PPO | Attending: Oncology

## 2020-05-15 ENCOUNTER — Other Ambulatory Visit: Payer: Self-pay

## 2020-05-15 VITALS — BP 140/73 | HR 78 | Temp 98.1°F | Resp 16

## 2020-05-15 DIAGNOSIS — R351 Nocturia: Secondary | ICD-10-CM | POA: Diagnosis not present

## 2020-05-15 DIAGNOSIS — D649 Anemia, unspecified: Secondary | ICD-10-CM | POA: Insufficient documentation

## 2020-05-15 DIAGNOSIS — Z79899 Other long term (current) drug therapy: Secondary | ICD-10-CM | POA: Insufficient documentation

## 2020-05-15 DIAGNOSIS — C163 Malignant neoplasm of pyloric antrum: Secondary | ICD-10-CM | POA: Insufficient documentation

## 2020-05-15 DIAGNOSIS — C787 Secondary malignant neoplasm of liver and intrahepatic bile duct: Secondary | ICD-10-CM | POA: Insufficient documentation

## 2020-05-15 DIAGNOSIS — T451X5A Adverse effect of antineoplastic and immunosuppressive drugs, initial encounter: Secondary | ICD-10-CM | POA: Insufficient documentation

## 2020-05-15 DIAGNOSIS — Z5112 Encounter for antineoplastic immunotherapy: Secondary | ICD-10-CM | POA: Diagnosis not present

## 2020-05-15 DIAGNOSIS — Z86711 Personal history of pulmonary embolism: Secondary | ICD-10-CM | POA: Diagnosis not present

## 2020-05-15 DIAGNOSIS — Z5111 Encounter for antineoplastic chemotherapy: Secondary | ICD-10-CM | POA: Insufficient documentation

## 2020-05-15 DIAGNOSIS — R180 Malignant ascites: Secondary | ICD-10-CM | POA: Diagnosis not present

## 2020-05-15 DIAGNOSIS — G620 Drug-induced polyneuropathy: Secondary | ICD-10-CM | POA: Diagnosis not present

## 2020-05-15 DIAGNOSIS — C169 Malignant neoplasm of stomach, unspecified: Secondary | ICD-10-CM

## 2020-05-15 MED ORDER — SODIUM CHLORIDE 0.9% FLUSH
10.0000 mL | INTRAVENOUS | Status: DC | PRN
  Administered 2020-05-15: 10 mL
  Filled 2020-05-15: qty 10

## 2020-05-15 MED ORDER — SODIUM CHLORIDE 0.9 % IV SOLN
200.0000 mg | Freq: Once | INTRAVENOUS | Status: AC
  Administered 2020-05-15: 200 mg via INTRAVENOUS
  Filled 2020-05-15: qty 8

## 2020-05-15 MED ORDER — SODIUM CHLORIDE 0.9 % IV SOLN
Freq: Once | INTRAVENOUS | Status: AC
  Filled 2020-05-15: qty 250

## 2020-05-15 MED ORDER — HEPARIN SOD (PORK) LOCK FLUSH 100 UNIT/ML IV SOLN
500.0000 [IU] | Freq: Once | INTRAVENOUS | Status: AC | PRN
  Administered 2020-05-15: 500 [IU]
  Filled 2020-05-15: qty 5

## 2020-05-15 NOTE — Patient Instructions (Signed)
[facility] Discharge Instructions for Patients Receiving Chemotherapy  Today you received the following chemotherapy agents: pembrolizumab.  To help prevent nausea and vomiting after your treatment, we encourage you to take your nausea medication as directed.   If you develop nausea and vomiting that is not controlled by your nausea medication, call the clinic.   BELOW ARE SYMPTOMS THAT SHOULD BE REPORTED IMMEDIATELY:  *FEVER GREATER THAN 100.5 F  *CHILLS WITH OR WITHOUT FEVER  NAUSEA AND VOMITING THAT IS NOT CONTROLLED WITH YOUR NAUSEA MEDICATION  *UNUSUAL SHORTNESS OF BREATH  *UNUSUAL BRUISING OR BLEEDING  TENDERNESS IN MOUTH AND THROAT WITH OR WITHOUT PRESENCE OF ULCERS  *URINARY PROBLEMS  *BOWEL PROBLEMS  UNUSUAL RASH Items with * indicate a potential emergency and should be followed up as soon as possible.  Feel free to call the clinic should you have any questions or concerns. The clinic phone number is [phone].  Please show the CHEMO ALERT CARD at check-in to the Emergency Department and triage nurse.   

## 2020-05-20 ENCOUNTER — Other Ambulatory Visit: Payer: Self-pay | Admitting: Oncology

## 2020-05-21 ENCOUNTER — Ambulatory Visit (HOSPITAL_COMMUNITY)
Admission: RE | Admit: 2020-05-21 | Discharge: 2020-05-21 | Disposition: A | Discharge status: Home / Self Care | Payer: Medicare PPO | Source: Ambulatory Visit | Attending: Nurse Practitioner | Admitting: Nurse Practitioner

## 2020-05-21 ENCOUNTER — Other Ambulatory Visit: Payer: Self-pay

## 2020-05-21 ENCOUNTER — Encounter (HOSPITAL_COMMUNITY): Payer: Self-pay

## 2020-05-21 DIAGNOSIS — N200 Calculus of kidney: Secondary | ICD-10-CM | POA: Diagnosis not present

## 2020-05-21 DIAGNOSIS — K3189 Other diseases of stomach and duodenum: Secondary | ICD-10-CM | POA: Diagnosis not present

## 2020-05-21 DIAGNOSIS — K7689 Other specified diseases of liver: Secondary | ICD-10-CM | POA: Diagnosis not present

## 2020-05-21 DIAGNOSIS — C169 Malignant neoplasm of stomach, unspecified: Secondary | ICD-10-CM | POA: Diagnosis not present

## 2020-05-21 MED ORDER — IOHEXOL 300 MG/ML  SOLN
100.0000 mL | Freq: Once | INTRAMUSCULAR | Status: AC | PRN
  Administered 2020-05-21: 100 mL via INTRAVENOUS

## 2020-05-22 ENCOUNTER — Inpatient Hospital Stay: Payer: Medicare PPO

## 2020-05-22 ENCOUNTER — Inpatient Hospital Stay (HOSPITAL_BASED_OUTPATIENT_CLINIC_OR_DEPARTMENT_OTHER): Payer: Medicare PPO | Admitting: Nurse Practitioner

## 2020-05-22 ENCOUNTER — Other Ambulatory Visit: Payer: Self-pay

## 2020-05-22 ENCOUNTER — Encounter: Payer: Self-pay | Admitting: Nurse Practitioner

## 2020-05-22 VITALS — BP 119/67 | HR 76 | Temp 97.4°F | Resp 17 | Ht 67.0 in | Wt 155.8 lb

## 2020-05-22 DIAGNOSIS — C169 Malignant neoplasm of stomach, unspecified: Secondary | ICD-10-CM

## 2020-05-22 DIAGNOSIS — Z95828 Presence of other vascular implants and grafts: Secondary | ICD-10-CM

## 2020-05-22 DIAGNOSIS — R351 Nocturia: Secondary | ICD-10-CM | POA: Diagnosis not present

## 2020-05-22 DIAGNOSIS — R180 Malignant ascites: Secondary | ICD-10-CM | POA: Diagnosis not present

## 2020-05-22 DIAGNOSIS — D649 Anemia, unspecified: Secondary | ICD-10-CM | POA: Diagnosis not present

## 2020-05-22 DIAGNOSIS — Z86711 Personal history of pulmonary embolism: Secondary | ICD-10-CM | POA: Diagnosis not present

## 2020-05-22 DIAGNOSIS — C787 Secondary malignant neoplasm of liver and intrahepatic bile duct: Secondary | ICD-10-CM | POA: Diagnosis not present

## 2020-05-22 DIAGNOSIS — C186 Malignant neoplasm of descending colon: Secondary | ICD-10-CM | POA: Diagnosis not present

## 2020-05-22 DIAGNOSIS — Z5112 Encounter for antineoplastic immunotherapy: Secondary | ICD-10-CM | POA: Diagnosis not present

## 2020-05-22 DIAGNOSIS — Z5111 Encounter for antineoplastic chemotherapy: Secondary | ICD-10-CM | POA: Diagnosis not present

## 2020-05-22 DIAGNOSIS — G620 Drug-induced polyneuropathy: Secondary | ICD-10-CM | POA: Diagnosis not present

## 2020-05-22 DIAGNOSIS — C163 Malignant neoplasm of pyloric antrum: Secondary | ICD-10-CM | POA: Diagnosis not present

## 2020-05-22 LAB — CMP (CANCER CENTER ONLY)
ALT: 17 U/L (ref 0–44)
AST: 26 U/L (ref 15–41)
Albumin: 3 g/dL — ABNORMAL LOW (ref 3.5–5.0)
Alkaline Phosphatase: 85 U/L (ref 38–126)
Anion gap: 8 (ref 5–15)
BUN: 8 mg/dL (ref 8–23)
CO2: 28 mmol/L (ref 22–32)
Calcium: 9 mg/dL (ref 8.9–10.3)
Chloride: 103 mmol/L (ref 98–111)
Creatinine: 0.85 mg/dL (ref 0.61–1.24)
GFR, Estimated: >60 mL/min (ref >60)
Glucose, Bld: 202 mg/dL — ABNORMAL HIGH (ref 70–99)
Potassium: 3.7 mmol/L (ref 3.5–5.1)
Sodium: 139 mmol/L (ref 135–145)
Total Bilirubin: 0.4 mg/dL (ref 0.3–1.2)
Total Protein: 6.3 g/dL — ABNORMAL LOW (ref 6.5–8.1)

## 2020-05-22 LAB — CBC WITH DIFFERENTIAL (CANCER CENTER ONLY)
Abs Immature Granulocytes: 0.01 10*3/uL (ref 0.00–0.07)
Basophils Absolute: 0 10*3/uL (ref 0.0–0.1)
Basophils Relative: 0 %
Eosinophils Absolute: 0.1 10*3/uL (ref 0.0–0.5)
Eosinophils Relative: 1 %
HCT: 24.6 % — ABNORMAL LOW (ref 39.0–52.0)
Hemoglobin: 7.7 g/dL — ABNORMAL LOW (ref 13.0–17.0)
Immature Granulocytes: 0 %
Lymphocytes Relative: 16 %
Lymphs Abs: 0.7 10*3/uL (ref 0.7–4.0)
MCH: 27.1 pg (ref 26.0–34.0)
MCHC: 31.3 g/dL (ref 30.0–36.0)
MCV: 86.6 fL (ref 80.0–100.0)
Monocytes Absolute: 0.5 10*3/uL (ref 0.1–1.0)
Monocytes Relative: 10 %
Neutro Abs: 3.3 10*3/uL (ref 1.7–7.7)
Neutrophils Relative %: 73 %
Platelet Count: 174 10*3/uL (ref 150–400)
RBC: 2.84 MIL/uL — ABNORMAL LOW (ref 4.22–5.81)
RDW: 18.6 % — ABNORMAL HIGH (ref 11.5–15.5)
WBC Count: 4.6 10*3/uL (ref 4.0–10.5)
nRBC: 0 % (ref 0.0–0.2)

## 2020-05-22 LAB — MAGNESIUM: Magnesium: 2 mg/dL (ref 1.7–2.4)

## 2020-05-22 LAB — TSH: TSH: 1.263 u[IU]/mL (ref 0.320–4.118)

## 2020-05-22 MED ORDER — DIPHENHYDRAMINE HCL 25 MG PO CAPS
50.0000 mg | ORAL_CAPSULE | Freq: Once | ORAL | Status: AC
  Administered 2020-05-22: 50 mg via ORAL

## 2020-05-22 MED ORDER — ACETAMINOPHEN 325 MG PO TABS
ORAL_TABLET | ORAL | Status: AC
  Filled 2020-05-22: qty 2

## 2020-05-22 MED ORDER — SODIUM CHLORIDE 0.9 % IV SOLN
1600.0000 mg/m2 | INTRAVENOUS | Status: DC
  Administered 2020-05-22: 3200 mg via INTRAVENOUS
  Filled 2020-05-22: qty 64

## 2020-05-22 MED ORDER — DIPHENHYDRAMINE HCL 25 MG PO CAPS
ORAL_CAPSULE | ORAL | Status: AC
  Filled 2020-05-22: qty 2

## 2020-05-22 MED ORDER — SODIUM CHLORIDE 0.9% FLUSH
10.0000 mL | Freq: Once | INTRAVENOUS | Status: AC
  Administered 2020-05-22: 10 mL
  Filled 2020-05-22: qty 10

## 2020-05-22 MED ORDER — DEXTROSE 5 % IV SOLN
Freq: Once | INTRAVENOUS | Status: DC
  Filled 2020-05-22: qty 250

## 2020-05-22 MED ORDER — SODIUM CHLORIDE 0.9 % IV SOLN
INTRAVENOUS | Status: DC
  Filled 2020-05-22: qty 250

## 2020-05-22 MED ORDER — PALONOSETRON HCL INJECTION 0.25 MG/5ML
0.2500 mg | Freq: Once | INTRAVENOUS | Status: AC
  Administered 2020-05-22: 0.25 mg via INTRAVENOUS

## 2020-05-22 MED ORDER — SODIUM CHLORIDE 0.9 % IV SOLN
10.0000 mg | Freq: Once | INTRAVENOUS | Status: AC
  Administered 2020-05-22: 10 mg via INTRAVENOUS
  Filled 2020-05-22: qty 10

## 2020-05-22 MED ORDER — PALONOSETRON HCL INJECTION 0.25 MG/5ML
INTRAVENOUS | Status: AC
  Filled 2020-05-22: qty 5

## 2020-05-22 MED ORDER — LEUCOVORIN CALCIUM INJECTION 350 MG
300.0000 mg/m2 | Freq: Once | INTRAVENOUS | Status: DC
  Filled 2020-05-22: qty 30.2

## 2020-05-22 MED ORDER — ACETAMINOPHEN 325 MG PO TABS
650.0000 mg | ORAL_TABLET | Freq: Once | ORAL | Status: AC
  Administered 2020-05-22: 650 mg via ORAL

## 2020-05-22 MED ORDER — TRASTUZUMAB-ANNS CHEMO 150 MG IV SOLR
300.0000 mg | Freq: Once | INTRAVENOUS | Status: AC
  Administered 2020-05-22: 300 mg via INTRAVENOUS
  Filled 2020-05-22: qty 14.29

## 2020-05-22 MED ORDER — OXALIPLATIN CHEMO INJECTION 100 MG/20ML
85.0000 mg/m2 | Freq: Once | INTRAVENOUS | Status: DC
  Filled 2020-05-22: qty 34

## 2020-05-22 NOTE — Progress Notes (Addendum)
Drew [name]  INTERVAL HISTORY:   Drew [name] returns as scheduled.  He completed a cycle of FOLFOX/trastuzumab 05/08/2020.  He completed another cycle of Pembrolizumab 05/15/2020.  He denies nausea/vomiting.  No mouth sores.  No diarrhea, mild constipation.  Stable numbness in the fingers.  No rash.  Good appetite.  He and his daughter are surprised about the weight loss.  Objective:  Vital signs in last 24 hours:  Blood pressure 119/67, pulse 76, temperature (!) 97.4 F (36.3 C), temperature source Tympanic, resp. rate 17, height _0  (1.702 m), weight 149 lb 14.4 oz (68 kg), SpO2 100 %.    HEENT: No thrush or ulcers. Resp: Lungs clear bilaterally. Cardio: Regular rate and rhythm. GI: Fullness right upper abdomen.  No hepatomegaly.  Nontender.  Vascular: No leg edema. Skin: No rash. Port-A-Cath without erythema.   Lab Results:  Lab Results  Component Value Date   WBC 4.6 05/22/2020   HGB 7.7 (L) 05/22/2020   HCT 24.6 (L) 05/22/2020   MCV 86.6 05/22/2020   PLT 174 05/22/2020   NEUTROABS 3.3 05/22/2020    Imaging:  CT Abdomen Pelvis W Contrast  Result Date: 05/21/2020 CLINICAL DATA:  Restaging of metastatic gastric [name], ongoing chemotherapy EXAM: CT ABDOMEN AND PELVIS WITH CONTRAST TECHNIQUE: Multidetector CT imaging of the abdomen and pelvis was performed using the standard protocol following bolus administration of intravenous contrast. CONTRAST:  158m OMNIPAQUE IOHEXOL 300 MG/ML  SOLN COMPARISON:  03/27/2020 FINDINGS: Lower chest: Small right pleural effusion is reduced in size from prior. Right central line tip terminates at the cavoatrial junction. Mild coronary atherosclerosis observed along with descending thoracic aortic atherosclerosis. Faint reticulonodular opacities in the posterior basal segment right lower lobe on image 24 of series 5 through image 42 of series 5, probably inflammatory or due to  atypical infectious bronchiolitis. Right pericardial lymph node 0.9 cm in short axis on image 19 of series 2, formerly the same. Hepatobiliary: Hepatic cysts noted with somewhat indistinct hepatic hypodense lesions which are less sharply defined than on the prior exam. A lesion in the dome of the right hepatic lobe measures about 2.0 by 1.8 cm on image 17 of series 2, and by my measurements was previously 2.0 by 1.7 cm. Taking into account the slightly reduced marginal definition of these lesions, the overall burden of hepatic metastatic disease appears similar to the prior exam. Pancreas: Unremarkable Spleen: Unremarkable Adrenals/Urinary Tract: Small hypodense bilateral renal lesions are likely cysts although technically too small to characterize. Adrenal glands unremarkable. Urinary bladder unremarkable. Three small punctate left-sided renal calculi are each about 0.2 cm in diameter. Stomach/Bowel: Marked irregular gastric wall thickening in the gastric antrum traversed by a gastric stent. Contrast medium is present along the lumen of the stent, with the contrast column narrowed to about 0.5 cm centrally along the stent. Prominent stool throughout the colon favors constipation. Vascular/Lymphatic: Aortoiliac atherosclerotic vascular disease. Small aortocaval lymph nodes appear similar to prior. Reproductive: Unremarkable Other: Again observed is considerable omental caking of tumor. Right omental caking measures 2.0 cm in thickness on image 47 of series 2, previously 1.7 cm. Scattered tumor nodularity in the omentum observed. Mild perihepatic ascites, likely malignant. Increased nodularity along the left upper paracolic gutter for example on image 36 of series 2. Musculoskeletal: Stable sclerosis in the right iliac bone, probably a benign chondroid lesion. IMPRESSION: 1. Mildly worsened omental caking of tumor. Mild perihepatic ascites, likely malignant. 2. Marked  irregular gastric wall thickening in the gastric  antrum traversed by a gastric stent. Contrast medium is present along the lumen of the stent, with the contrast column narrowed to about 0.5 cm centrally along the stent. 3. Other imaging findings of potential clinical significance: Faint reticulonodular opacities in the posterior basal segment right lower lobe, probably inflammatory or due to atypical infectious bronchiolitis. Small right pleural effusion is reduced in size from prior. Prominent stool throughout the colon favors constipation. Nonobstructive left nephrolithiasis. 4. Aortic atherosclerosis. Aortic Atherosclerosis (ICD10-I70.0). Electronically Signed   By: Van Clines M.D.   On: 05/21/2020 11:00    Medications: I have reviewed the patient's current medications.  Assessment/Plan: 1.Metastatic gastric [name] presenting with gastric outlet obstruction  CT abdomen[facility]pelvis at [facility] 01/03/2020-masslike wall thickening of the gastric antrum, numerous liver metastases, small volume ascites, numerous peritoneal nodules, enlarged periaortic and porta hepatis lymph nodes  Upper endoscopy 01/06/2020-large ulcer in the gastric antrum with heaped up and firm edges, Duodenal bulb largely "obliterated" by the mass, biopsy confirmed adenocarcinoma, HER-2 3+, intact mismatch repair protein expression, PD-L1: 4   CT chest 01/10/2020-acute bilateral pulmonary emboli, no lymphadenopathy or suspicious lung nodules  Baseline echo 01/17/20 at [facility], EF 60-65%  CT abdomen/pelvis 01/22/2020, gastric mass, liver metastases, peritoneal/omental nodules, ascites, right pleural effusion  Therapeutic and diagnostic paracentesis on 01/27/2020, 2.1 L removed;cytology shows malignant cells,malignant ascites  PD-L1combined positive score: 1 (from ascites/periteoneal fluid), reported 02/01/20   Cycle 1 FOLFOX with trastuzumab and pembrolizumab, 01/31/2020  Baseline CEA 13.6 (02/08/2020)  Cycle 2 FOLFOX/trastuzumab 02/15/20  Pembrolizumab 02/22/2020  Cycle  3 FOLFOX/trastuzumab 02/28/2020  Cycle 4 FOLFOX/trastuzumab 03/14/2020  Pembrolizumab 03/14/2020  CTs 03/27/2020-multifocal liver metastases again noted, appear mildly decreased in size in the interval. Persistent diffuse peritoneal disease with mild decrease in size of index peritoneal lesions. Similar volume of upper abdominal ascites. Small right pleural effusion is decreased.  Cycle 5 FOLFOX/trastuzumab 03/28/2020  Pembrolizumab 9/22/202  Cycle 6 FOLFOX/trastuzumab 04/10/2020  Pembrolizumab 04/24/2020  Cycle 7 FOLFOX/trastuzumab 04/24/2020  Cycle 8 FOLFOX/trastuzumab 05/08/2020  Pembrolizumab 05/15/2020  CTs 05/21/2020-mildly worsened omental caking of tumor.  Mild perihepatic ascites.  Marked irregular gastric wall thickening in the gastric antrum traversed by gastric stent.  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 Coster appears stable.  He has completed 8 cycles of FOLFOX/Herceptin plus pembrolizumab.  Restaging  CTs show overall stable disease, no new disease.  Report and images reviewed with Mr. Clingerman and his daughter at today's visit.  Dr. Gearldine Shown recommendation is to continue treatment with 5-fluorouracil/Herceptin/pembrolizumab on a 3-week schedule, eliminating oxaliplatin.  They are in agreement with this plan.  He will receive treatment today with 5-fluorouracil/Herceptin.  We reviewed the CBC from today.  Counts adequate to proceed with treatment.  He has progressive anemia.  He appears asymptomatic.  Obtain type and screen when he returns in 3 weeks.  He will return for lab, follow-up, 5-FU/Herceptin/pembrolizumab in 3 weeks.  He will contact the office in the interim with any problems.   Patient seen with Dr. Benay Spice.    Ned Card ANP/GNP-BC   05/22/2020  10:03 AM  This was a shared visit with Ned Card. We discussed the CT results and reviewed the images with Mr. Escue and his daughter. The CT findings and his clinical status are consistent with stable disease. The plan is to change to a maintenance regimen with 5-FU/Herceptin and pembrolizumab.  Julieanne Manson, MD

## 2020-05-22 NOTE — Patient Instructions (Addendum)
[facility] Discharge Instructions for Patients Receiving Chemotherapy  Today you received the following monoclonal antibody and chemotherapy agents Trastuzumab-anns (KANJINTI) & Flourouracil (ADRUCIL).  To help prevent nausea and vomiting after your treatment, we encourage you to take your nausea medication as prescribed.   If you develop nausea and vomiting that is not controlled by your nausea medication, call the clinic.   BELOW ARE SYMPTOMS THAT SHOULD BE REPORTED IMMEDIATELY:  *FEVER GREATER THAN 100.5 F  *CHILLS WITH OR WITHOUT FEVER  NAUSEA AND VOMITING THAT IS NOT CONTROLLED WITH YOUR NAUSEA MEDICATION  *UNUSUAL SHORTNESS OF BREATH  *UNUSUAL BRUISING OR BLEEDING  TENDERNESS IN MOUTH AND THROAT WITH OR WITHOUT PRESENCE OF ULCERS  *URINARY PROBLEMS  *BOWEL PROBLEMS  UNUSUAL RASH Items with * indicate a potential emergency and should be followed up as soon as possible.  Feel free to call the clinic should you have any questions or concerns. The clinic phone number is [phone].  Please show the [place] at check-in to the Emergency Department and triage nurse.

## 2020-05-22 NOTE — Progress Notes (Signed)
Nutrition Follow-up:   Patient with metastatic gastric cancer.  Patient receiving folfox with herceptin.    Met with patient during infusion.  Patient reports that appetite is good. Reports yesterday ate 2 egg omelette and pancake for breakfast. Skipped lunch because had a test.  Normally would eat a cold cut sandwich or drink ensure.  Super last night was [place] leg, small amount of potatoes and small amount of mixed vegetables and piece of cake.  Drinks about 1 ensure daily.     Medications: reviewed  Labs: reviewed  Anthropometrics:   Weight 155 lb 12.8 oz decreased from 161 on 10/12   NUTRITION DIAGNOSIS: Unintentional weight loss continues   INTERVENTION:  Encouraged patient to increase shakes to BID for added calories and protein. Complimentary case of ensure given to patient today.      MONITORING, EVALUATION, GOAL: weight trends, intake   NEXT VISIT: to be determined with treatment  Drew [name], [place], [place] Registered Dietitian [phone] (mobile)

## 2020-05-22 NOTE — Progress Notes (Addendum)
MD changing treatment to Herceptin and 5FU pump maintenance, no Oxaliplatin/LV. Deleted Oxaliplatin and LV for today. Deleted Emend and Dex for future treatments. On 06/12/20, will increase Herceptin to 6mg /kg per MD.  Orders changed as requested.  Hardie Pulley, PharmD, BCPS, BCP

## 2020-05-23 ENCOUNTER — Telehealth: Payer: Self-pay | Admitting: Nurse Practitioner

## 2020-05-23 NOTE — Telephone Encounter (Signed)
Scheduled appointments per 11/9 los. Will have updated calendar printed for patient.

## 2020-05-24 ENCOUNTER — Other Ambulatory Visit: Payer: Self-pay

## 2020-05-24 ENCOUNTER — Other Ambulatory Visit: Payer: Self-pay | Admitting: Nurse Practitioner

## 2020-05-24 ENCOUNTER — Inpatient Hospital Stay: Payer: Medicare PPO

## 2020-05-24 VITALS — BP 131/71 | HR 77 | Resp 18

## 2020-05-24 DIAGNOSIS — D649 Anemia, unspecified: Secondary | ICD-10-CM | POA: Diagnosis not present

## 2020-05-24 DIAGNOSIS — Z86711 Personal history of pulmonary embolism: Secondary | ICD-10-CM | POA: Diagnosis not present

## 2020-05-24 DIAGNOSIS — G620 Drug-induced polyneuropathy: Secondary | ICD-10-CM | POA: Diagnosis not present

## 2020-05-24 DIAGNOSIS — C169 Malignant neoplasm of stomach, unspecified: Secondary | ICD-10-CM

## 2020-05-24 DIAGNOSIS — R351 Nocturia: Secondary | ICD-10-CM | POA: Diagnosis not present

## 2020-05-24 DIAGNOSIS — C163 Malignant neoplasm of pyloric antrum: Secondary | ICD-10-CM | POA: Diagnosis not present

## 2020-05-24 DIAGNOSIS — Z5111 Encounter for antineoplastic chemotherapy: Secondary | ICD-10-CM | POA: Diagnosis not present

## 2020-05-24 DIAGNOSIS — R180 Malignant ascites: Secondary | ICD-10-CM | POA: Diagnosis not present

## 2020-05-24 DIAGNOSIS — Z5112 Encounter for antineoplastic immunotherapy: Secondary | ICD-10-CM | POA: Diagnosis not present

## 2020-05-24 DIAGNOSIS — C787 Secondary malignant neoplasm of liver and intrahepatic bile duct: Secondary | ICD-10-CM | POA: Diagnosis not present

## 2020-05-24 MED ORDER — HEPARIN SOD (PORK) LOCK FLUSH 100 UNIT/ML IV SOLN
500.0000 [IU] | Freq: Once | INTRAVENOUS | Status: AC | PRN
  Administered 2020-05-24: 500 [IU]
  Filled 2020-05-24: qty 5

## 2020-05-24 MED ORDER — SODIUM CHLORIDE 0.9% FLUSH
10.0000 mL | INTRAVENOUS | Status: DC | PRN
  Administered 2020-05-24: 10 mL
  Filled 2020-05-24: qty 10

## 2020-05-31 ENCOUNTER — Encounter: Payer: Self-pay | Admitting: Oncology

## 2020-06-01 ENCOUNTER — Other Ambulatory Visit: Payer: Self-pay | Admitting: *Deleted

## 2020-06-01 DIAGNOSIS — C169 Malignant neoplasm of stomach, unspecified: Secondary | ICD-10-CM | POA: Diagnosis not present

## 2020-06-01 DIAGNOSIS — R140 Abdominal distension (gaseous): Secondary | ICD-10-CM | POA: Diagnosis not present

## 2020-06-01 DIAGNOSIS — I639 Cerebral infarction, unspecified: Secondary | ICD-10-CM | POA: Diagnosis not present

## 2020-06-01 DIAGNOSIS — K311 Adult hypertrophic pyloric stenosis: Secondary | ICD-10-CM | POA: Diagnosis not present

## 2020-06-01 DIAGNOSIS — I2699 Other pulmonary embolism without acute cor pulmonale: Secondary | ICD-10-CM | POA: Diagnosis not present

## 2020-06-01 MED ORDER — BACLOFEN 10 MG PO TABS
10.0000 mg | ORAL_TABLET | Freq: Three times a day (TID) | ORAL | 2 refills | Status: DC | PRN
Start: 2020-06-01 — End: 2023-01-07

## 2020-06-01 NOTE — Progress Notes (Signed)
Per Mychart request, sent script for baclofen for hiccups as ordered by Dr. Benay Spice.

## 2020-06-02 ENCOUNTER — Emergency Department (HOSPITAL_COMMUNITY): Payer: Medicare PPO

## 2020-06-02 ENCOUNTER — Inpatient Hospital Stay (HOSPITAL_COMMUNITY)
Admission: EM | Admit: 2020-06-02 | Discharge: 2020-06-06 | DRG: 375 | Disposition: A | Discharge status: Home / Self Care | Payer: Medicare PPO | Attending: Internal Medicine | Admitting: Internal Medicine

## 2020-06-02 ENCOUNTER — Encounter (HOSPITAL_COMMUNITY): Payer: Self-pay

## 2020-06-02 ENCOUNTER — Other Ambulatory Visit: Payer: Self-pay

## 2020-06-02 DIAGNOSIS — K315 Obstruction of duodenum: Secondary | ICD-10-CM

## 2020-06-02 DIAGNOSIS — K920 Hematemesis: Secondary | ICD-10-CM | POA: Diagnosis not present

## 2020-06-02 DIAGNOSIS — C787 Secondary malignant neoplasm of liver and intrahepatic bile duct: Secondary | ICD-10-CM | POA: Diagnosis present

## 2020-06-02 DIAGNOSIS — R739 Hyperglycemia, unspecified: Secondary | ICD-10-CM | POA: Diagnosis present

## 2020-06-02 DIAGNOSIS — R066 Hiccough: Secondary | ICD-10-CM | POA: Diagnosis present

## 2020-06-02 DIAGNOSIS — K311 Adult hypertrophic pyloric stenosis: Secondary | ICD-10-CM | POA: Diagnosis not present

## 2020-06-02 DIAGNOSIS — E1165 Type 2 diabetes mellitus with hyperglycemia: Secondary | ICD-10-CM | POA: Diagnosis not present

## 2020-06-02 DIAGNOSIS — D62 Acute posthemorrhagic anemia: Secondary | ICD-10-CM | POA: Diagnosis not present

## 2020-06-02 DIAGNOSIS — K922 Gastrointestinal hemorrhage, unspecified: Secondary | ICD-10-CM | POA: Diagnosis not present

## 2020-06-02 DIAGNOSIS — D649 Anemia, unspecified: Secondary | ICD-10-CM

## 2020-06-02 DIAGNOSIS — K2100 Gastro-esophageal reflux disease with esophagitis, without bleeding: Secondary | ICD-10-CM | POA: Diagnosis present

## 2020-06-02 DIAGNOSIS — E785 Hyperlipidemia, unspecified: Secondary | ICD-10-CM | POA: Diagnosis present

## 2020-06-02 DIAGNOSIS — T85858A Stenosis due to other internal prosthetic devices, implants and grafts, initial encounter: Secondary | ICD-10-CM | POA: Diagnosis present

## 2020-06-02 DIAGNOSIS — R0902 Hypoxemia: Secondary | ICD-10-CM | POA: Diagnosis not present

## 2020-06-02 DIAGNOSIS — E876 Hypokalemia: Secondary | ICD-10-CM | POA: Diagnosis present

## 2020-06-02 DIAGNOSIS — Z86711 Personal history of pulmonary embolism: Secondary | ICD-10-CM | POA: Diagnosis not present

## 2020-06-02 DIAGNOSIS — K449 Diaphragmatic hernia without obstruction or gangrene: Secondary | ICD-10-CM | POA: Diagnosis present

## 2020-06-02 DIAGNOSIS — R143 Flatulence: Secondary | ICD-10-CM | POA: Diagnosis not present

## 2020-06-02 DIAGNOSIS — N401 Enlarged prostate with lower urinary tract symptoms: Secondary | ICD-10-CM | POA: Diagnosis present

## 2020-06-02 DIAGNOSIS — Y831 Surgical operation with implant of artificial internal device as the cause of abnormal reaction of the patient, or of later complication, without mention of misadventure at the time of the procedure: Secondary | ICD-10-CM | POA: Diagnosis present

## 2020-06-02 DIAGNOSIS — Z20822 Contact with and (suspected) exposure to covid-19: Secondary | ICD-10-CM | POA: Diagnosis present

## 2020-06-02 DIAGNOSIS — C163 Malignant neoplasm of pyloric antrum: Secondary | ICD-10-CM | POA: Diagnosis not present

## 2020-06-02 DIAGNOSIS — R351 Nocturia: Secondary | ICD-10-CM | POA: Diagnosis present

## 2020-06-02 DIAGNOSIS — C169 Malignant neoplasm of stomach, unspecified: Secondary | ICD-10-CM | POA: Diagnosis not present

## 2020-06-02 DIAGNOSIS — Z7901 Long term (current) use of anticoagulants: Secondary | ICD-10-CM

## 2020-06-02 DIAGNOSIS — K5641 Fecal impaction: Secondary | ICD-10-CM | POA: Diagnosis not present

## 2020-06-02 DIAGNOSIS — I10 Essential (primary) hypertension: Secondary | ICD-10-CM | POA: Diagnosis present

## 2020-06-02 DIAGNOSIS — Z79899 Other long term (current) drug therapy: Secondary | ICD-10-CM

## 2020-06-02 DIAGNOSIS — Z87891 Personal history of nicotine dependence: Secondary | ICD-10-CM

## 2020-06-02 DIAGNOSIS — K219 Gastro-esophageal reflux disease without esophagitis: Secondary | ICD-10-CM | POA: Diagnosis not present

## 2020-06-02 DIAGNOSIS — Z8673 Personal history of transient ischemic attack (TIA), and cerebral infarction without residual deficits: Secondary | ICD-10-CM | POA: Diagnosis not present

## 2020-06-02 DIAGNOSIS — R933 Abnormal findings on diagnostic imaging of other parts of digestive tract: Secondary | ICD-10-CM | POA: Diagnosis not present

## 2020-06-02 DIAGNOSIS — K921 Melena: Secondary | ICD-10-CM | POA: Diagnosis present

## 2020-06-02 DIAGNOSIS — R29810 Facial weakness: Secondary | ICD-10-CM | POA: Diagnosis not present

## 2020-06-02 DIAGNOSIS — Z66 Do not resuscitate: Secondary | ICD-10-CM | POA: Diagnosis present

## 2020-06-02 DIAGNOSIS — R180 Malignant ascites: Secondary | ICD-10-CM | POA: Diagnosis present

## 2020-06-02 DIAGNOSIS — Z9689 Presence of other specified functional implants: Secondary | ICD-10-CM | POA: Diagnosis not present

## 2020-06-02 DIAGNOSIS — Z8042 Family history of malignant neoplasm of prostate: Secondary | ICD-10-CM

## 2020-06-02 DIAGNOSIS — E871 Hypo-osmolality and hyponatremia: Secondary | ICD-10-CM | POA: Diagnosis present

## 2020-06-02 DIAGNOSIS — R112 Nausea with vomiting, unspecified: Secondary | ICD-10-CM | POA: Diagnosis not present

## 2020-06-02 LAB — COMPREHENSIVE METABOLIC PANEL
ALT: 11 U/L (ref 0–44)
AST: 25 U/L (ref 15–41)
Albumin: 2.8 g/dL — ABNORMAL LOW (ref 3.5–5.0)
Alkaline Phosphatase: 60 U/L (ref 38–126)
Anion gap: 9 (ref 5–15)
BUN: 12 mg/dL (ref 8–23)
CO2: 30 mmol/L (ref 22–32)
Calcium: 8.4 mg/dL — ABNORMAL LOW (ref 8.9–10.3)
Chloride: 92 mmol/L — ABNORMAL LOW (ref 98–111)
Creatinine, Ser: 0.89 mg/dL (ref 0.61–1.24)
GFR, Estimated: >60 mL/min (ref >60)
Glucose, Bld: 170 mg/dL — ABNORMAL HIGH (ref 70–99)
Potassium: 3.2 mmol/L — ABNORMAL LOW (ref 3.5–5.1)
Sodium: 131 mmol/L — ABNORMAL LOW (ref 135–145)
Total Bilirubin: 0.3 mg/dL (ref 0.3–1.2)
Total Protein: 5.7 g/dL — ABNORMAL LOW (ref 6.5–8.1)

## 2020-06-02 LAB — MAGNESIUM: Magnesium: 2 mg/dL (ref 1.7–2.4)

## 2020-06-02 LAB — CBC WITH DIFFERENTIAL/PLATELET
Abs Immature Granulocytes: 0.03 10*3/uL (ref 0.00–0.07)
Basophils Absolute: 0 10*3/uL (ref 0.0–0.1)
Basophils Relative: 0 %
Eosinophils Absolute: 0 10*3/uL (ref 0.0–0.5)
Eosinophils Relative: 0 %
HCT: 18.6 % — ABNORMAL LOW (ref 39.0–52.0)
Hemoglobin: 5.6 g/dL — CL (ref 13.0–17.0)
Immature Granulocytes: 0 %
Lymphocytes Relative: 12 %
Lymphs Abs: 0.9 10*3/uL (ref 0.7–4.0)
MCH: 27.3 pg (ref 26.0–34.0)
MCHC: 30.1 g/dL (ref 30.0–36.0)
MCV: 90.7 fL (ref 80.0–100.0)
Monocytes Absolute: 0.7 10*3/uL (ref 0.1–1.0)
Monocytes Relative: 9 %
Neutro Abs: 5.8 10*3/uL (ref 1.7–7.7)
Neutrophils Relative %: 79 %
Platelets: 206 10*3/uL (ref 150–400)
RBC: 2.05 MIL/uL — ABNORMAL LOW (ref 4.22–5.81)
RDW: 18.7 % — ABNORMAL HIGH (ref 11.5–15.5)
WBC: 7.4 10*3/uL (ref 4.0–10.5)
nRBC: 0 % (ref 0.0–0.2)

## 2020-06-02 LAB — URINALYSIS, ROUTINE W REFLEX MICROSCOPIC
Bilirubin Urine: NEGATIVE
Glucose, UA: NEGATIVE mg/dL
Hgb urine dipstick: NEGATIVE
Ketones, ur: NEGATIVE mg/dL
Leukocytes,Ua: NEGATIVE
Nitrite: NEGATIVE
Protein, ur: NEGATIVE mg/dL
Specific Gravity, Urine: 1.009 (ref 1.005–1.030)
pH: 7 (ref 5.0–8.0)

## 2020-06-02 LAB — LIPASE, BLOOD: Lipase: 26 U/L (ref 11–51)

## 2020-06-02 LAB — RESP PANEL BY RT-PCR (FLU A&B, COVID) ARPGX2
Influenza A by PCR: NEGATIVE
Influenza B by PCR: NEGATIVE
SARS Coronavirus 2 by RT PCR: NEGATIVE

## 2020-06-02 LAB — ABO/RH: ABO/RH(D): O POS

## 2020-06-02 LAB — PREPARE RBC (CROSSMATCH)

## 2020-06-02 MED ORDER — PANTOPRAZOLE SODIUM 40 MG IV SOLR
40.0000 mg | Freq: Once | INTRAVENOUS | Status: AC
  Administered 2020-06-02: 40 mg via INTRAVENOUS
  Filled 2020-06-02: qty 40

## 2020-06-02 MED ORDER — ONDANSETRON HCL 4 MG/2ML IJ SOLN
4.0000 mg | Freq: Four times a day (QID) | INTRAMUSCULAR | Status: DC | PRN
  Administered 2020-06-03 – 2020-06-04 (×2): 4 mg via INTRAVENOUS
  Filled 2020-06-02 (×3): qty 2

## 2020-06-02 MED ORDER — POTASSIUM CHLORIDE CRYS ER 20 MEQ PO TBCR
40.0000 meq | EXTENDED_RELEASE_TABLET | Freq: Two times a day (BID) | ORAL | Status: DC
  Administered 2020-06-02 – 2020-06-03 (×3): 40 meq via ORAL
  Filled 2020-06-02 (×3): qty 2

## 2020-06-02 MED ORDER — OMEGA-3-ACID ETHYL ESTERS 1 G PO CAPS
1.0000 g | ORAL_CAPSULE | Freq: Every day | ORAL | Status: DC
  Administered 2020-06-04 – 2020-06-06 (×3): 1 g via ORAL
  Filled 2020-06-02 (×4): qty 1

## 2020-06-02 MED ORDER — ONDANSETRON HCL 4 MG PO TABS: 4.0000 mg | ORAL_TABLET | Freq: Four times a day (QID) | ORAL | Status: DC | PRN

## 2020-06-02 MED ORDER — PROCHLORPERAZINE MALEATE 10 MG PO TABS
10.0000 mg | ORAL_TABLET | Freq: Four times a day (QID) | ORAL | Status: DC | PRN
  Administered 2020-06-02 – 2020-06-03 (×3): 10 mg via ORAL
  Filled 2020-06-02 (×3): qty 1

## 2020-06-02 MED ORDER — TAMSULOSIN HCL 0.4 MG PO CAPS
0.4000 mg | ORAL_CAPSULE | Freq: Every day | ORAL | Status: DC
  Administered 2020-06-03 – 2020-06-06 (×4): 0.4 mg via ORAL
  Filled 2020-06-02 (×4): qty 1

## 2020-06-02 MED ORDER — SODIUM CHLORIDE 0.9 % IV BOLUS
500.0000 mL | Freq: Once | INTRAVENOUS | Status: AC
  Administered 2020-06-02: 500 mL via INTRAVENOUS

## 2020-06-02 MED ORDER — SODIUM CHLORIDE 0.9% IV SOLUTION: Freq: Once | INTRAVENOUS | Status: DC

## 2020-06-02 MED ORDER — PANTOPRAZOLE SODIUM 40 MG IV SOLR
40.0000 mg | Freq: Two times a day (BID) | INTRAVENOUS | Status: DC
  Administered 2020-06-02 – 2020-06-06 (×8): 40 mg via INTRAVENOUS
  Filled 2020-06-02 (×8): qty 40

## 2020-06-02 MED ORDER — MAGNESIUM OXIDE 400 (241.3 MG) MG PO TABS
400.0000 mg | ORAL_TABLET | Freq: Two times a day (BID) | ORAL | Status: DC
  Administered 2020-06-02 – 2020-06-06 (×6): 400 mg via ORAL
  Filled 2020-06-02 (×8): qty 1

## 2020-06-02 MED ORDER — BACLOFEN 10 MG PO TABS
10.0000 mg | ORAL_TABLET | Freq: Three times a day (TID) | ORAL | Status: DC | PRN
  Administered 2020-06-02 – 2020-06-05 (×5): 10 mg via ORAL
  Filled 2020-06-02 (×5): qty 1

## 2020-06-02 NOTE — ED Notes (Signed)
Report given to 6E RN 

## 2020-06-02 NOTE — ED Notes (Signed)
Pt ambulates with this RN. Pulse ox level did not change- remained 100% on RA

## 2020-06-02 NOTE — Consult Note (Signed)
Reason for Consult: Melena some dark vomitus Referring Physician: Hospital team  Drew [name] is an 74 y.o. male.  HPI: Patient seen and examined and discussed with the ER team in our office computer chart reviewed in his hospital computer chart reviewed and his [facility] work-up reviewed and he is on Advil and blood thinner at home but supposedly also takes a stomach pill and his recent CT was reviewed and for about a week he has had some darker stools with some increased hiccups and nausea and vomiting after eating and some of the vomitus has been dark but he has not seen any obvious fresh blood and currently he denies any pain and no other complaints  Past Medical History:  Diagnosis Date  . ED (erectile dysfunction)   . gastric ca dx'd 12/2019  . GERD (gastroesophageal reflux disease)   . High cholesterol   . Hypertension   . Hypogonadism male   . Prediabetes   . PUD (peptic ulcer disease)     Past Surgical History:  Procedure Laterality Date  . FINGER AMPUTATION    . TONSILLECTOMY      Family History  Problem Relation Age of Onset  . Diabetes Other   . Prostate cancer Brother        both brothers    Social History:  reports that he has quit smoking. He has never used smokeless tobacco. He reports current alcohol use of about 10.0 standard drinks of alcohol per week. He reports that he does not use drugs.  Allergies: No Known Allergies  Medications: I have reviewed the patient's current medications.  Results for orders placed or performed during the hospital encounter of 06/02/20 (from the past 48 hour(s))  CBC with Differential     Status: Abnormal   Collection Time: 06/02/20 12:28 PM  Result Value Ref Range   WBC 7.4 4.0 - 10.5 K/uL   RBC 2.05 (L) 4.22 - 5.81 MIL/uL   Hemoglobin 5.6 (LL) 13.0 - 17.0 g/dL    Comment: REPEATED TO VERIFY THIS CRITICAL RESULT HAS VERIFIED AND BEEN CALLED TO PATTY,D BY AISHA MOHAMED ON 11 20 2021 AT 1354, AND HAS BEEN READ BACK.      HCT 18.6 (L) 39 - 52 %   MCV 90.7 80.0 - 100.0 fL   MCH 27.3 26.0 - 34.0 pg   MCHC 30.1 30.0 - 36.0 g/dL   RDW 18.7 (H) 11.5 - 15.5 %   Platelets 206 150 - 400 K/uL   nRBC 0.0 0.0 - 0.2 %   Neutrophils Relative % 79 %   Neutro Abs 5.8 1.7 - 7.7 K/uL   Lymphocytes Relative 12 %   Lymphs Abs 0.9 0.7 - 4.0 K/uL   Monocytes Relative 9 %   Monocytes Absolute 0.7 0.1 - 1.0 K/uL   Eosinophils Relative 0 %   Eosinophils Absolute 0.0 0.0 - 0.5 K/uL   Basophils Relative 0 %   Basophils Absolute 0.0 0.0 - 0.1 K/uL   Immature Granulocytes 0 %   Abs Immature Granulocytes 0.03 0.00 - 0.07 K/uL    Comment: Performed at [facility], [place] [address] [place], [place] [address]  Comprehensive metabolic panel     Status: Abnormal   Collection Time: 06/02/20 12:28 PM  Result Value Ref Range   Sodium 131 (L) 135 - 145 mmol/L   Potassium 3.2 (L) 3.5 - 5.1 mmol/L   Chloride 92 (L) 98 - 111 mmol/L   CO2 30 22 - 32  mmol/L   Glucose, Bld 170 (H) 70 - 99 mg/dL    Comment: Glucose reference range applies only to samples taken after fasting for at least 8 hours.   BUN 12 8 - 23 mg/dL   Creatinine, Ser 0.89 0.61 - 1.24 mg/dL   Calcium 8.4 (L) 8.9 - 10.3 mg/dL   Total Protein 5.7 (L) 6.5 - 8.1 g/dL   Albumin 2.8 (L) 3.5 - 5.0 g/dL   AST 25 15 - 41 U/L   ALT 11 0 - 44 U/L   Alkaline Phosphatase 60 38 - 126 U/L   Total Bilirubin 0.3 0.3 - 1.2 mg/dL   GFR, Estimated >60 >60 mL/min    Comment: (NOTE) Calculated using the CKD-EPI Creatinine Equation (2021)    Anion gap 9 5 - 15    Comment: Performed at [facility], [place] [address] [place], [place] [address]  Lipase, blood     Status: None   Collection Time: 06/02/20 12:28 PM  Result Value Ref Range   Lipase 26 11 - 51 U/L    Comment: Performed at [facility], [place] [address], [place], [place] [address]  Urinalysis, Routine w reflex microscopic Urine, Clean Catch     Status: None   Collection  Time: 06/02/20 12:28 PM  Result Value Ref Range   Color, Urine YELLOW YELLOW   APPearance CLEAR CLEAR   Specific Gravity, Urine 1.009 1.005 - 1.030   pH 7.0 5.0 - 8.0   Glucose, UA NEGATIVE NEGATIVE mg/dL   Hgb urine dipstick NEGATIVE NEGATIVE   Bilirubin Urine NEGATIVE NEGATIVE   Ketones, ur NEGATIVE NEGATIVE mg/dL   Protein, ur NEGATIVE NEGATIVE mg/dL   Nitrite NEGATIVE NEGATIVE   Leukocytes,Ua NEGATIVE NEGATIVE    Comment: Performed at [facility], [place] [address], [place], [place] [address]  Magnesium     Status: None   Collection Time: 06/02/20 12:28 PM  Result Value Ref Range   Magnesium 2.0 1.7 - 2.4 mg/dL    Comment: Performed at [facility], [place] [address] [place], [place] [address]  Type and screen [place]     Status: None   Collection Time: 06/02/20  3:04 PM  Result Value Ref Range   ABO/RH(D) O POS    Antibody Screen NEG    Sample Expiration      06/05/2020,2359 Performed at [facility], [place] [address], [place], [place] [address]   Prepare RBC (crossmatch)     Status: None   Collection Time: 06/02/20  3:04 PM  Result Value Ref Range   Order Confirmation      ORDER PROCESSED BY BLOOD BANK Performed at [facility], [place] [address] [place], [place] [address]   Resp Panel by RT-PCR (Flu A&B, Covid) Nasopharyngeal Swab     Status: None   Collection Time: 06/02/20  3:09 PM   Specimen: Nasopharyngeal Swab; Nasopharyngeal(NP) swabs in vial transport medium  Result Value Ref Range   SARS Coronavirus 2 by RT PCR NEGATIVE NEGATIVE    Comment: (NOTE) SARS-CoV-2 target nucleic acids are NOT DETECTED.  The SARS-CoV-2 RNA is generally detectable in upper respiratory specimens during the acute phase of infection. The lowest concentration of SARS-CoV-2 viral copies this assay can detect is 138 copies/mL. A negative result does not preclude SARS-Cov-2 infection and should  not be used as the sole basis for treatment or other patient management decisions. A negative result may occur with  improper specimen collection/handling, submission of specimen other than nasopharyngeal swab, presence of viral mutation(s) within the areas targeted by this assay, and inadequate number of viral copies(<138 copies/mL). A negative result must be combined with clinical observations, patient history, and epidemiological information. The expected result is Negative.  Fact Sheet for Patients:  EntrepreneurPulse.com.au  Fact Sheet for Healthcare Providers:  IncredibleEmployment.be  This test is no t yet approved or cleared by the [place] FDA and  has been authorized for detection and/or diagnosis of SARS-CoV-2 by FDA under an Emergency Use Authorization (EUA). This EUA will remain  in effect (meaning this test can be used) for the duration of the COVID-19 declaration under Section 564(b)(1) of the Act, 21 U.S.C.section 360bbb-3(b)(1), unless the authorization is terminated  or revoked sooner.       Influenza A by PCR NEGATIVE NEGATIVE   Influenza B by PCR NEGATIVE NEGATIVE    Comment: (NOTE) The Xpert Xpress SARS-CoV-2/FLU/RSV plus assay is intended as an aid in the diagnosis of influenza from Nasopharyngeal swab specimens and should not be used as a sole basis for treatment. Nasal washings and aspirates are unacceptable for Xpert Xpress SARS-CoV-2/FLU/RSV testing.  Fact Sheet for Patients: EntrepreneurPulse.com.au  Fact Sheet for Healthcare Providers: IncredibleEmployment.be  This test is not yet approved or cleared by the [place] FDA and has been authorized for detection and/or diagnosis of SARS-CoV-2 by FDA under an Emergency Use Authorization (EUA). This EUA will remain in effect (meaning this test can be used) for the duration of the COVID-19 declaration under Section 564(b)(1)  of the Act, 21 U.S.C. section 360bbb-3(b)(1), unless the authorization is terminated or revoked.  Performed at [facility], [place] [address], [place], [place] [address]     DG Abdomen Acute W/Chest  Result Date: 06/02/2020 CLINICAL DATA:  Nausea, vomiting. Generalized weakness. History of gastric cancer EXAM: DG ABDOMEN ACUTE WITH 1 VIEW CHEST COMPARISON:  05/21/2020 FINDINGS: Gastric stent is present. There is no evidence of dilated bowel loops or free intraperitoneal air. Moderate volume stool. There is some residual contrast throughout the colon. Sclerotic focus again noted within the right iliac bone. Right-sided Port-A-Cath terminates at the cavoatrial junction. Heart size and mediastinal contours are within normal limits. Both lungs are clear. IMPRESSION: 1. Nonobstructive bowel gas pattern. Moderate volume stool burden. 2. No acute cardiopulmonary process. Electronically Signed   By: Davina Poke D.O.   On: 06/02/2020 13:36    Review of Systems negative except above Blood pressure 129/66, pulse 75, temperature 98.2 F (36.8 C), temperature source Oral, resp. rate 13, height 5\' 7"  (1.702 m), weight 70.3 kg, SpO2 100 %. Physical Exam vital signs stable afebrile no acute distress abdomen is soft nontender labs reviewed BUN okay CT reviewed today's x-rays reviewed  Assessment/Plan: Subacute GI blood loss in patient with blood thinners nonsteroidals and gastric cancer and stent Plan: We will allow clear liquids will probably require a repeat endoscopy not only to reevaluate the source of bleeding but reevaluate the stent since he had not been having nausea vomiting until the last week and will probably proceed on Monday unless urgently needed tomorrow and will need to caution him about nonsteroidal use in the face of blood thinners and use Tylenol only and will check on tomorrow but call sooner if signs of active bleeding  Jaimon Bugaj E 06/02/2020, 4:43 PM

## 2020-06-02 NOTE — ED Triage Notes (Signed)
Pt bib ems from home. Endorses N/V/D and generalized weakness +/- 1 week.  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

## 2020-06-02 NOTE — H&P (Signed)
History and Physical    DONAVON KIMREY ZOX:[id number] DOB: [birth year] DOA: 06/02/2020  PCP: Gaynelle Arabian, MD  Patient coming from: Home  Chief Complaint: general weakness.   HPI: Drew [name] is a 74 y.o. male with medical history significant of gastric cancer, PE on eliquis, HTN. Presenting with weakness. He reports that has a history of recurrent, severe hiccups. He went through 5 days of severe hiccups that led to several episodes of vomiting. He was able to get help from his oncologist with a prescription for muscle relaxers yesterday. The muscle relaxers calmed the hiccups and he was able to drink a little fluids last night. However this morning when he woke up, he felt dehydrated and weak. He tried to eat and drink, but he started vomiting again. He became concerned and came to the ED.     ED Course: Hgb was found to be 5.6. KUB showed moderate stool burden. 2 units pRBCs were ordered. He was placed on protonix. GI was consulted [facility], [place]). [facility] was called for admission.   Review of Systems:  Reports lightheadedness, weakness, N/V w/ hematemesis. No abdominal pain, CP, palpitations, syncopal episodes. Review of systems is otherwise negative for all not mentioned in HPI.   PMHx Past Medical History:  Diagnosis Date   ED (erectile dysfunction)    gastric ca dx'd 12/2019   GERD (gastroesophageal reflux disease)    High cholesterol    Hypertension    Hypogonadism male    Prediabetes    PUD (peptic ulcer disease)     PSHx Past Surgical History:  Procedure Laterality Date   FINGER AMPUTATION     TONSILLECTOMY      SocHx  reports that he has quit smoking. He has never used smokeless tobacco. He reports current alcohol use of about 10.0 standard drinks of alcohol per week. He reports that he does not use drugs.  No Known Allergies  FamHx Family History  Problem Relation Age of Onset   Diabetes Other    Prostate cancer Brother        both brothers     Prior to Admission medications   Medication Sig Start Date End Date Taking? Authorizing Provider  acetaminophen (TYLENOL) 500 MG tablet Take 1,000 mg by mouth every 6 (six) hours as needed for mild pain.   Yes [provider]  baclofen (LIORESAL) 10 MG tablet Take 1 tablet (10 mg total) by mouth 3 (three) times daily as needed for muscle spasms. 06/01/20  Yes Ladell Pier, MD  diphenoxylate-atropine (LOMOTIL) 2.5-0.025 MG tablet TAKE 1-2 TABLETS BY MOUTH FOUR TIMES DAILY AS NEEDED FOR DIARHEA OR LOOSE STOOLS Patient taking differently: Take 1 tablet by mouth daily as needed for diarrhea or loose stools.  05/02/20  Yes Ladell Pier, MD  ELIQUIS 5 MG TABS tablet Take 1 tablet (5 mg total) by mouth 2 (two) times daily. 05/08/20  Yes Owens Shark, NP  FEROSUL 325 (65 Fe) MG tablet Take 1 tablet (325 mg total) by mouth daily. 04/10/20  Yes Ladell Pier, MD  HYDROcodone-acetaminophen (NORCO/VICODIN) 5-325 MG tablet Take 1 tablet by mouth every 6 (six) hours as needed for moderate pain.  01/20/20  Yes [provider]  lidocaine-prilocaine (EMLA) cream Apply 1 application topically as needed. Patient taking differently: Apply 1 application topically as needed (For numbing).  01/27/20  Yes Ladell Pier, MD  magnesium oxide (MAG-OX) 400 (241.3 Mg) MG tablet Take 1 tablet (400 mg total) by mouth 2 (  two) times daily. 05/08/20  Yes Owens Shark, NP  metoprolol succinate (TOPROL-XL) 25 MG 24 hr tablet Take 1 tablet (25 mg total) by mouth daily. 05/08/20  Yes Owens Shark, NP  Omega-3 Fatty Acids (FISH OIL) 1000 MG CAPS Take 2 capsules by mouth daily.   Yes [provider]  pantoprazole (PROTONIX) 40 MG tablet Take 1 tablet (40 mg total) by mouth 2 (two) times daily. 05/08/20  Yes Owens Shark, NP  potassium chloride SA (KLOR-CON) 20 MEQ tablet TAKE 1 TABLET(20 MEQ) BY MOUTH DAILY Patient taking differently: Take 20 mEq by mouth daily.  04/24/20  Yes Ladell Pier, MD  prochlorperazine (COMPAZINE) 10 MG tablet Take 1 tablet (10 mg total) by mouth every 6 (six) hours as needed for nausea or vomiting. 04/10/20  Yes Ladell Pier, MD  tamsulosin (FLOMAX) 0.4 MG CAPS capsule TAKE 1 CAPSULE(0.4 MG) BY MOUTH DAILY Patient taking differently: Take 0.4 mg by mouth daily.  04/24/20  Yes Ladell Pier, MD  atorvastatin (LIPITOR) 10 MG tablet Take 1 tablet (10 mg total) by mouth daily. Patient not taking: Reported on 06/02/2020 05/08/20   Owens Shark, NP  polyethylene glycol (MIRALAX / GLYCOLAX) 17 g packet Take 17 g by mouth daily as needed. Patient not taking: Reported on 04/24/2020 01/29/20   Antonieta Pert, MD    Physical Exam: Vitals:   06/02/20 1245 06/02/20 1315 06/02/20 1330 06/02/20 1500  BP: 130/67 118/68  124/67  Pulse: 74 74 73 76  Resp:  (!) 26 15 17   Temp:      TempSrc:      SpO2: 100% 100% 100% 100%  Weight:      Height:        General: 74 y.o. male resting in bed in NAD Eyes: PERRL, normal sclera ENMT: Nares patent w/o discharge, orophaynx clear, dentition normal, ears w/o discharge/lesions/ulcers Neck: Supple, trachea midline Cardiovascular: RRR, +S1, S2, no m/g/r, equal pulses throughout Respiratory: CTABL, no w/r/r, normal WOB GI: BS+, NDNT, no masses noted, no organomegaly noted MSK: No e/c/c Skin: No rashes, bruises, ulcerations noted Neuro: A&O x 3, no focal deficits Psyc: Appropriate interaction and affect, calm/cooperative  Labs on Admission: I have personally reviewed following labs and imaging studies  CBC: Recent Labs  Lab 06/02/20 1228  WBC 7.4  NEUTROABS 5.8  HGB 5.6*  HCT 18.6*  MCV 90.7  PLT 102   Basic Metabolic Panel: Recent Labs  Lab 06/02/20 1228  NA 131*  K 3.2*  CL 92*  CO2 30  GLUCOSE 170*  BUN 12  CREATININE 0.89  CALCIUM 8.4*  MG 2.0   GFR: Estimated Creatinine Clearance: 70.1 mL/min (by C-G formula based on SCr of 0.89 mg/dL). Liver Function Tests: Recent Labs  Lab  06/02/20 1228  AST 25  ALT 11  ALKPHOS 60  BILITOT 0.3  PROT 5.7*  ALBUMIN 2.8*   Recent Labs  Lab 06/02/20 1228  LIPASE 26   No results for input(s): AMMONIA in the last 168 hours. Coagulation Profile: No results for input(s): INR, PROTIME in the last 168 hours. Cardiac Enzymes: No results for input(s): CKTOTAL, CKMB, CKMBINDEX, TROPONINI in the last 168 hours. BNP (last 3 results) No results for input(s): PROBNP in the last 8760 hours. HbA1C: No results for input(s): HGBA1C in the last 72 hours. CBG: No results for input(s): GLUCAP in the last 168 hours. Lipid Profile: No results for input(s): CHOL, HDL, LDLCALC, TRIG, CHOLHDL, LDLDIRECT in the last  72 hours. Thyroid Function Tests: No results for input(s): TSH, T4TOTAL, FREET4, T3FREE, THYROIDAB in the last 72 hours. Anemia Panel: No results for input(s): VITAMINB12, FOLATE, FERRITIN, TIBC, IRON, RETICCTPCT in the last 72 hours. Urine analysis:    Component Value Date/Time   COLORURINE YELLOW 06/02/2020 1228   APPEARANCEUR CLEAR 06/02/2020 1228   LABSPEC 1.009 06/02/2020 1228   PHURINE 7.0 06/02/2020 1228   GLUCOSEU NEGATIVE 06/02/2020 1228   HGBUR NEGATIVE 06/02/2020 1228   BILIRUBINUR NEGATIVE 06/02/2020 [place] 06/02/2020 1228   PROTEINUR NEGATIVE 06/02/2020 1228   UROBILINOGEN 0.2 09/25/2011 2206   NITRITE NEGATIVE 06/02/2020 [place] 06/02/2020 1228    Radiological Exams on Admission: DG Abdomen Acute W/Chest  Result Date: 06/02/2020 CLINICAL DATA:  Nausea, vomiting. Generalized weakness. History of gastric cancer EXAM: DG ABDOMEN ACUTE WITH 1 VIEW CHEST COMPARISON:  05/21/2020 FINDINGS: Gastric stent is present. There is no evidence of dilated bowel loops or free intraperitoneal air. Moderate volume stool. There is some residual contrast throughout the colon. Sclerotic focus again noted within the right iliac bone. Right-sided Port-A-Cath terminates at the cavoatrial  junction. Heart size and mediastinal contours are within normal limits. Both lungs are clear. IMPRESSION: 1. Nonobstructive bowel gas pattern. Moderate volume stool burden. 2. No acute cardiopulmonary process. Electronically Signed   By: Davina Poke D.O.   On: 06/02/2020 13:36   Assessment/Plan GIB Symptomatic anemia     - admit to inpt, tele     - 2 units pRBCs ordered in ED; follow H&H     - protonix     - [facility] consulted     - No procedure planned right now; CLD  Hypokalemia     - replace K+; Mg2+ ok  Hyponatremia     - mild, follow for now  Hyperglycemia     - check A1c  HLD     - continue fish oil  HTN     - hold metoprolol for now, follow  BPH     - continue flomax  Hiccups     - continue baclofen  DVT prophylaxis: SCDs  Code Status: DNR  Family Communication: with family at bedside  Consults called: EDP spoke with [facility] ([place])  Status is: Inpatient  Remains inpatient appropriate because:Inpatient level of care appropriate due to severity of illness   Dispo: The patient is from: Home              Anticipated d/c is to: Home              Anticipated d/c date is: 3 days              Patient currently is not medically stable to d/c.  Jonnie Finner DO [facility]  If 7PM-7AM, please contact night-coverage [email or web address]  06/02/2020, 4:00 PM

## 2020-06-02 NOTE — ED Provider Notes (Addendum)
[facility] DEPT Provider Note   CSN: [id number] Arrival date & time: 06/02/20  1147     History Chief Complaint  Patient presents with  . Nausea  . Weakness  . Diarrhea    Drew [name] is a 74 y.o. male history of gastric cancer on chemotherapy, GERD, hypertension, peptic ulcer disease, CVA, PE, on Eliquis, DNR.  Patient presents today for concern of dehydration and generalized weakness.  He reports that over the last 5 days he has been experiencing hiccups these had been constant and associated with occasional nonbloody/nonbilious emesis.  He reports he was prescribed prochlorperazine yesterday and hiccups have resolved.  He is concerned because he has been feeling generally fatigued over the past few days and unable to eat that he may be dehydrated and need IV fluids.  He reports some mild nausea but no recurrence of vomiting since yesterday afternoon.  He denies any associated pain reports he otherwise is feeling well.  Denies any diarrhea does report some dark stool over the past few weeks.  Denies fever/chills, fall/injury, headache, vision changes, unilateral weakness, difficulty speaking, balance issues, chest pain/shortness of breath, abdominal pain, dysuria/hematuria or any additional concerns.  HPI     Past Medical History:  Diagnosis Date  . ED (erectile dysfunction)   . gastric ca dx'd 12/2019  . GERD (gastroesophageal reflux disease)   . High cholesterol   . Hypertension   . Hypogonadism male   . Prediabetes   . PUD (peptic ulcer disease)     Patient Active Problem List   Diagnosis Date Noted  . GIB (gastrointestinal bleeding) 06/02/2020  . Port-A-Cath in place 01/31/2020  . Abdominal distention   . Palliative care by specialist   . DNR (do not resuscitate)   . DNR (do not resuscitate) discussion   . Advanced care planning/counseling discussion   . Goals of care, counseling/discussion 01/26/2020  . Sepsis ([place]) 01/22/2020    . CVA (cerebral vascular accident) ([place]) 01/20/2020  . Gastric outlet obstruction 01/20/2020  . Pulmonary embolism ([place]) 01/20/2020  . Severe protein-calorie malnutrition ([place]) 01/20/2020  . 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 Iron deficiency 01/06/2020  . Acute blood loss anemia 01/03/2020  . Acute upper GI bleed 01/03/2020  . Metastasis ([place]) 01/03/2020  . Pleural effusion, right 01/03/2020    Past Surgical History:  Procedure Laterality Date  . FINGER AMPUTATION    . TONSILLECTOMY         Family History  Problem Relation Age of Onset  . Diabetes Other   . Prostate cancer Brother        both brothers    Social History   Tobacco Use  . Smoking status: Former Research scientist (life sciences)  . Smokeless tobacco: Never Used  Vaping Use  . Vaping Use: Never used  Substance Use Topics  . Alcohol use: Yes    Alcohol/week: 10.0 standard drinks    Types: 10 Glasses of wine per week  . Drug use: No    Home Medications Prior to Admission medications   Medication Sig Start Date End Date Taking? Authorizing Provider  acetaminophen (TYLENOL) 500 MG tablet Take 1,000 mg by mouth every 6 (six) hours as needed for mild pain.   Yes [provider]  baclofen (LIORESAL) 10 MG tablet Take 1 tablet (10 mg total) by mouth 3 (three) times daily as needed for muscle spasms. 06/01/20  Yes Ladell Pier, MD  diphenoxylate-atropine (LOMOTIL) 2.5-0.025 MG tablet TAKE 1-2 TABLETS BY MOUTH FOUR TIMES  DAILY AS NEEDED FOR DIARHEA OR LOOSE STOOLS Patient taking differently: Take 1 tablet by mouth daily as needed for diarrhea or loose stools.  05/02/20  Yes Ladell Pier, MD  ELIQUIS 5 MG TABS tablet Take 1 tablet (5 mg total) by mouth 2 (two) times daily. 05/08/20  Yes Owens Shark, NP  FEROSUL 325 (65 Fe) MG tablet Take 1 tablet (325 mg total) by mouth daily. 04/10/20  Yes Ladell Pier, MD  HYDROcodone-acetaminophen (NORCO/VICODIN) 5-325 MG tablet Take 1 tablet by mouth every 6 (six) hours as  needed for moderate pain.  01/20/20  Yes [provider]  lidocaine-prilocaine (EMLA) cream Apply 1 application topically as needed. Patient taking differently: Apply 1 application topically as needed (For numbing).  01/27/20  Yes Ladell Pier, MD  magnesium oxide (MAG-OX) 400 (241.3 Mg) MG tablet Take 1 tablet (400 mg total) by mouth 2 (two) times daily. 05/08/20  Yes Owens Shark, NP  metoprolol succinate (TOPROL-XL) 25 MG 24 hr tablet Take 1 tablet (25 mg total) by mouth daily. 05/08/20  Yes Owens Shark, NP  Omega-3 Fatty Acids (FISH OIL) 1000 MG CAPS Take 2 capsules by mouth daily.   Yes [provider]  pantoprazole (PROTONIX) 40 MG tablet Take 1 tablet (40 mg total) by mouth 2 (two) times daily. 05/08/20  Yes Owens Shark, NP  potassium chloride SA (KLOR-CON) 20 MEQ tablet TAKE 1 TABLET(20 MEQ) BY MOUTH DAILY Patient taking differently: Take 20 mEq by mouth daily.  04/24/20  Yes Ladell Pier, MD  prochlorperazine (COMPAZINE) 10 MG tablet Take 1 tablet (10 mg total) by mouth every 6 (six) hours as needed for nausea or vomiting. 04/10/20  Yes Ladell Pier, MD  tamsulosin (FLOMAX) 0.4 MG CAPS capsule TAKE 1 CAPSULE(0.4 MG) BY MOUTH DAILY Patient taking differently: Take 0.4 mg by mouth daily.  04/24/20  Yes Ladell Pier, MD  atorvastatin (LIPITOR) 10 MG tablet Take 1 tablet (10 mg total) by mouth daily. Patient not taking: Reported on 06/02/2020 05/08/20   Owens Shark, NP  polyethylene glycol (MIRALAX / GLYCOLAX) 17 g packet Take 17 g by mouth daily as needed. Patient not taking: Reported on 04/24/2020 01/29/20   Antonieta Pert, MD    Allergies    Patient has no known allergies.  Review of Systems   Review of Systems Ten systems are reviewed and are negative for acute change except as noted in the HPI  Physical Exam Updated Vital Signs BP 135/74   Pulse 83   Temp 98.2 F (36.8 C) (Oral)   Resp 15   Ht 5\' 7"  (1.702 m)   Wt 70.3 kg   SpO2 99%    BMI 24.28 kg/m   Physical Exam Constitutional:      General: He is not in acute distress.    Appearance: Normal appearance. He is well-developed. He is not ill-appearing or diaphoretic.  HENT:     Head: Normocephalic and atraumatic.  Eyes:     General: Vision grossly intact. Gaze aligned appropriately.     Pupils: Pupils are equal, round, and reactive to light.  Neck:     Trachea: Trachea and phonation normal.  Pulmonary:     Effort: Pulmonary effort is normal. No respiratory distress.  Abdominal:     General: There is no distension.     Palpations: Abdomen is soft.     Tenderness: There is no abdominal tenderness. There is no guarding or rebound.  Genitourinary:  Comments: Rectal examination chaperoned by Serita Butcher RN.  No bleeding external hemorrhoids.  Normal rectal tone.  No palpable internal abnormalities.  Melena present. Musculoskeletal:        General: Normal range of motion.     Cervical back: Normal range of motion.  Skin:    General: Skin is warm and dry.  Neurological:     Mental Status: He is alert.     GCS: GCS eye subscore is 4. GCS verbal subscore is 5. GCS motor subscore is 6.     Comments: Speech is clear and goal oriented, follows commands Major Cranial nerves without deficit, no facial droop Moves extremities without ataxia, coordination intact  Psychiatric:        Behavior: Behavior normal.     ED Results / Procedures / Treatments   Labs (all labs ordered are listed, but only abnormal results are displayed) Labs Reviewed  CBC WITH DIFFERENTIAL/PLATELET - Abnormal; Notable for the following components:      Result Value   RBC 2.05 (*)    Hemoglobin 5.6 (*)    HCT 18.6 (*)    RDW 18.7 (*)    All other components within normal limits  COMPREHENSIVE METABOLIC PANEL - Abnormal; Notable for the following components:   Sodium 131 (*)    Potassium 3.2 (*)    Chloride 92 (*)    Glucose, Bld 170 (*)    Calcium 8.4 (*)    Total Protein 5.7 (*)     Albumin 2.8 (*)    All other components within normal limits  RESP PANEL BY RT-PCR (FLU A&B, COVID) ARPGX2  LIPASE, BLOOD  URINALYSIS, ROUTINE W REFLEX MICROSCOPIC  MAGNESIUM  POC OCCULT BLOOD, ED  TYPE AND SCREEN  PREPARE RBC (CROSSMATCH)  ABO/RH    EKG None  Radiology DG Abdomen Acute W/Chest  Result Date: 06/02/2020 CLINICAL DATA:  Nausea, vomiting. Generalized weakness. History of gastric cancer EXAM: DG ABDOMEN ACUTE WITH 1 VIEW CHEST COMPARISON:  05/21/2020 FINDINGS: Gastric stent is present. There is no evidence of dilated bowel loops or free intraperitoneal air. Moderate volume stool. There is some residual contrast throughout the colon. Sclerotic focus again noted within the right iliac bone. Right-sided Port-A-Cath terminates at the cavoatrial junction. Heart size and mediastinal contours are within normal limits. Both lungs are clear. IMPRESSION: 1. Nonobstructive bowel gas pattern. Moderate volume stool burden. 2. No acute cardiopulmonary process. Electronically Signed   By: Davina Poke D.O.   On: 06/02/2020 13:36    Procedures .Critical Care Performed by: Deliah Boston, PA-C Authorized by: Deliah Boston, PA-C   Critical care provider statement:    Critical care time (minutes):  40   Critical care was time spent personally by me on the following activities:  Discussions with consultants, evaluation of patient's response to treatment, examination of patient, ordering and performing treatments and interventions, ordering and review of laboratory studies, ordering and review of radiographic studies, pulse oximetry, re-evaluation of patient's condition, obtaining history from patient or surrogate, review of old charts and development of treatment plan with patient or surrogate   (including critical care time)  Medications Ordered in ED Medications  0.9 %  sodium chloride infusion (Manually program via Guardrails IV Fluids) (has no administration in time  range)  pantoprazole (PROTONIX) injection 40 mg (40 mg Intravenous Given 06/02/20 1521)  sodium chloride 0.9 % bolus 500 mL (500 mLs Intravenous New Bag/Given 06/02/20 1522)    ED Course  I have reviewed the triage vital  signs and the nursing notes.  Pertinent labs & imaging results that were available during my care of the patient were reviewed by me and considered in my medical decision making (see chart for details).  Clinical Course as of Jun 02 1618  Sat Jun 02, 2020  1458 Bennett   [BM]  1551 Dr. Marylyn Ishihara   [BM]    Clinical Course User Index [BM] Gari Crown   MDM Rules/Calculators/A&P                         Additional history obtained from: 1. Nursing notes from this visit. 2. Family at bedside. 3. Electronic medical record review. ----------------------------- I ordered, reviewed and interpreted labs which include: CBC shows anemia of 5.6, dropped from 7.7, 11 days ago.  No leuks ptosis to suggest infection. CMP shows mild hyponatremia and mild hypokalemia, no AKI, normal BUN, no emergent LFT elevations or gap. Urinalysis evidence of UTI. Lipase normal limits, no evidence of pancreatitis. Magnesium within normal limits.  DG Abdomen/Chest:  IMPRESSION:  1. Nonobstructive bowel gas pattern. Moderate volume stool burden.  2. No acute cardiopulmonary process.  - Patient without abdominal pain chest pain or shortness of breath.  Benign abdominal examination.  No indication for CT imaging at this time.  Suspect patient with upper GI bleed.  Her review shows no history of varices.  Will give patient Protonix and transfuse with 2 units RBCs.  Fluid bolus ordered as well.  Consulted with gastroenterologist Dr. Watt Climes from [facility] who advises his team will be by to see patient today or tomorrow.  Consult placed to hospitalist service for admission.  Patient seen and evaluated by Dr. Tyrone Nine during this visit who agrees with plan of care. - Consulted with  hospitalist Dr. Marylyn Ishihara, patient accepted for admission.  Note: Portions of this report may have been transcribed using voice recognition software. Every effort was made to ensure accuracy; however, inadvertent computerized transcription errors may still be present. Final Clinical Impression(s) / ED Diagnoses Final diagnoses:  Gastrointestinal hemorrhage, unspecified gastrointestinal hemorrhage type  Anemia, unspecified type    Rx / DC Orders ED Discharge Orders    None       Deliah Boston, PA-C 06/02/20 1619    Deliah Boston, PA-C 06/02/20 [place], [place], DO 06/03/20 [phone]

## 2020-06-02 NOTE — ED Notes (Signed)
This Probation officer assisted PA with rectal exam. Pt tested positive (+) for hemoccult test.

## 2020-06-03 DIAGNOSIS — K922 Gastrointestinal hemorrhage, unspecified: Secondary | ICD-10-CM | POA: Diagnosis not present

## 2020-06-03 LAB — TYPE AND SCREEN
ABO/RH(D): O POS
Antibody Screen: NEGATIVE
Unit division: 0
Unit division: 0

## 2020-06-03 LAB — HEMOGLOBIN AND HEMATOCRIT, BLOOD
HCT: 26.1 % — ABNORMAL LOW (ref 39.0–52.0)
HCT: 27.4 % — ABNORMAL LOW (ref 39.0–52.0)
Hemoglobin: 8.3 g/dL — ABNORMAL LOW (ref 13.0–17.0)
Hemoglobin: 8.6 g/dL — ABNORMAL LOW (ref 13.0–17.0)

## 2020-06-03 LAB — COMPREHENSIVE METABOLIC PANEL
ALT: 10 U/L (ref 0–44)
AST: 19 U/L (ref 15–41)
Albumin: 2.8 g/dL — ABNORMAL LOW (ref 3.5–5.0)
Alkaline Phosphatase: 62 U/L (ref 38–126)
Anion gap: 8 (ref 5–15)
BUN: 9 mg/dL (ref 8–23)
CO2: 30 mmol/L (ref 22–32)
Calcium: 8.6 mg/dL — ABNORMAL LOW (ref 8.9–10.3)
Chloride: 100 mmol/L (ref 98–111)
Creatinine, Ser: 0.82 mg/dL (ref 0.61–1.24)
GFR, Estimated: >60 mL/min (ref >60)
Glucose, Bld: 129 mg/dL — ABNORMAL HIGH (ref 70–99)
Potassium: 3.9 mmol/L (ref 3.5–5.1)
Sodium: 138 mmol/L (ref 135–145)
Total Bilirubin: 0.5 mg/dL (ref 0.3–1.2)
Total Protein: 5.8 g/dL — ABNORMAL LOW (ref 6.5–8.1)

## 2020-06-03 LAB — BPAM RBC
Blood Product Expiration Date: 202112212359
Blood Product Expiration Date: 202112212359
ISSUE DATE / TIME: 202111201759
ISSUE DATE / TIME: 202111202030
Unit Type and Rh: 5100
Unit Type and Rh: 5100

## 2020-06-03 LAB — CBC
HCT: 26 % — ABNORMAL LOW (ref 39.0–52.0)
Hemoglobin: 8.5 g/dL — ABNORMAL LOW (ref 13.0–17.0)
MCH: 29.1 pg (ref 26.0–34.0)
MCHC: 32.7 g/dL (ref 30.0–36.0)
MCV: 89 fL (ref 80.0–100.0)
Platelets: 201 10*3/uL (ref 150–400)
RBC: 2.92 MIL/uL — ABNORMAL LOW (ref 4.22–5.81)
RDW: 17 % — ABNORMAL HIGH (ref 11.5–15.5)
WBC: 8 10*3/uL (ref 4.0–10.5)
nRBC: 0.4 % — ABNORMAL HIGH (ref 0.0–0.2)

## 2020-06-03 MED ORDER — CHLORHEXIDINE GLUCONATE CLOTH 2 % EX PADS
6.0000 | MEDICATED_PAD | Freq: Every day | CUTANEOUS | Status: DC
  Administered 2020-06-03 – 2020-06-05 (×3): 6 via TOPICAL

## 2020-06-03 NOTE — Progress Notes (Signed)
PROGRESS NOTE    Drew [name]  QMV:[id number] DOB: 01/16/1946 DOA: 06/02/2020 PCP: Gaynelle Arabian, MD   Chief Complain: Generalized weakness  Brief Narrative: Patient is a 74 year old male with history of gastric cancer, PE on Eliquis, hypertension who presented with weakness, vomiting.  On presentation his hemoglobin was found to be 5.6.  Units of PRBC were given in the emergency department.  He was started on Protonix IV.  GI consulted and plan for EGD tomorrow.  Assessment & Plan:   Active Problems:   GIB (gastrointestinal bleeding)   GI bleed/likely upper: Presented with severe anemia.  Patient reported black stools at home .continue Protonix 40 mg IV twice daily.  Currently on clear liquid diet.  Plan for EGD tomorrow.  Denies any abdominal pain No bloody bowel movement/bloody stools after admission.  Acute blood loss anemia: Hemoglobin in the range of 5 on presentation.  Status post units of PRBC.  Hemoglobin in the range of 8 today.  Continue to monitor CBC.  Gastric cancer: Has a gastric stent and follows with oncology.  We recommend recommend to follow-up with oncology as an outpatient  History of PE: On Eliquis at home Eliquis on hold  Hypokalemia: Supplemented and corrected  Hyperglycemia: Check hemoglobin A1c  Hypertension: Continue monitor blood pressure.  Home metoprolol on hold  BPH: Continue Flomax  Hiccups: Continue baclofen           DVT prophylaxis: SCD Code Status: DNR Family Communication: None at the bedside Status is: Inpatient  Remains inpatient appropriate because:Inpatient level of care appropriate due to severity of illness   Dispo: The patient is from: Home              Anticipated d/c is to: Home              Anticipated d/c date is: 1 day              Patient currently is not medically stable to d/c.    Consultants: GI  Procedures:None  Antimicrobials:  Anti-infectives (From admission, onward)   None       Subjective: Patient seen and examined the bedside this morning.  Hemodynamically stable and comfortable during my evaluation.  Denies any bloody bowel movement or black stools after admission today.  Denies any abdominal pain  Objective: Vitals:   06/02/20 2039 06/02/20 2100 06/02/20 2327 06/03/20 0549  BP: 135/80 135/72 (!) 141/74 128/74  Pulse: 80 78 76 84  Resp: 16   16  Temp: 98.2 F (36.8 C) 98.2 F (36.8 C) 97.6 F (36.4 C) 97.7 F (36.5 C)  TempSrc: Oral Oral Oral Oral  SpO2: 100% 100% 100% 99%  Weight:      Height:        Intake/Output Summary (Last 24 hours) at 06/03/2020 0757 Last data filed at 06/03/2020 2841 Gross per 24 hour  Intake 653.5 ml  Output 850 ml  Net -196.5 ml   Filed Weights   06/02/20 1206 06/02/20 1745  Weight: 70.3 kg 68.6 kg    Examination:  General exam: Appears calm and comfortable ,Not in distress,average built HEENT:PERRL,Oral mucosa moist, Ear/Nose normal on gross exam Respiratory system: Bilateral equal air entry, normal vesicular breath sounds, no wheezes or crackles  Cardiovascular system: S1 & S2 heard, RRR. No JVD, murmurs, rubs, gallops or clicks. No pedal edema.  Chemo-Port on the left chest Gastrointestinal system: Abdomen is nondistended, soft and nontender. No organomegaly or masses felt. Normal bowel sounds heard. Central nervous system:  Alert and oriented. No focal neurological deficits. Extremities: No edema, no clubbing ,no cyanosis Skin: No rashes, lesions or ulcers,no icterus ,no pallor    Data Reviewed: I have personally reviewed following labs and imaging studies  CBC: Recent Labs  Lab 06/02/20 1228 06/03/20 0226  WBC 7.4 8.0  NEUTROABS 5.8  --   HGB 5.6* 8.5*  HCT 18.6* 26.0*  MCV 90.7 89.0  PLT 206 856   Basic Metabolic Panel: Recent Labs  Lab 06/02/20 1228  NA 131*  K 3.2*  CL 92*  CO2 30  GLUCOSE 170*  BUN 12  CREATININE 0.89  CALCIUM 8.4*  MG 2.0   GFR: Estimated Creatinine  Clearance: 69 mL/min (by C-G formula based on SCr of 0.89 mg/dL). Liver Function Tests: Recent Labs  Lab 06/02/20 1228  AST 25  ALT 11  ALKPHOS 60  BILITOT 0.3  PROT 5.7*  ALBUMIN 2.8*   Recent Labs  Lab 06/02/20 1228  LIPASE 26   No results for input(s): AMMONIA in the last 168 hours. Coagulation Profile: No results for input(s): INR, PROTIME in the last 168 hours. Cardiac Enzymes: No results for input(s): CKTOTAL, CKMB, CKMBINDEX, TROPONINI in the last 168 hours. BNP (last 3 results) No results for input(s): PROBNP in the last 8760 hours. HbA1C: No results for input(s): HGBA1C in the last 72 hours. CBG: No results for input(s): GLUCAP in the last 168 hours. Lipid Profile: No results for input(s): CHOL, HDL, LDLCALC, TRIG, CHOLHDL, LDLDIRECT in the last 72 hours. Thyroid Function Tests: No results for input(s): TSH, T4TOTAL, FREET4, T3FREE, THYROIDAB in the last 72 hours. Anemia Panel: No results for input(s): VITAMINB12, FOLATE, FERRITIN, TIBC, IRON, RETICCTPCT in the last 72 hours. Sepsis Labs: No results for input(s): PROCALCITON, LATICACIDVEN in the last 168 hours.  Recent Results (from the past 240 hour(s))  Resp Panel by RT-PCR (Flu A&B, Covid) Nasopharyngeal Swab     Status: None   Collection Time: 06/02/20  3:09 PM   Specimen: Nasopharyngeal Swab; Nasopharyngeal(NP) swabs in vial transport medium  Result Value Ref Range Status   SARS Coronavirus 2 by RT PCR NEGATIVE NEGATIVE Final    Comment: (NOTE) SARS-CoV-2 target nucleic acids are NOT DETECTED.  The SARS-CoV-2 RNA is generally detectable in upper respiratory specimens during the acute phase of infection. The lowest concentration of SARS-CoV-2 viral copies this assay can detect is 138 copies/mL. A negative result does not preclude SARS-Cov-2 infection and should not be used as the sole basis for treatment or other patient management decisions. A negative result may occur with  improper specimen  collection/handling, submission of specimen other than nasopharyngeal swab, presence of viral mutation(s) within the areas targeted by this assay, and inadequate number of viral copies(<138 copies/mL). A negative result must be combined with clinical observations, patient history, and epidemiological information. The expected result is Negative.  Fact Sheet for Patients:  EntrepreneurPulse.com.au  Fact Sheet for Healthcare Providers:  IncredibleEmployment.be  This test is no t yet approved or cleared by the [place] FDA and  has been authorized for detection and/or diagnosis of SARS-CoV-2 by FDA under an Emergency Use Authorization (EUA). This EUA will remain  in effect (meaning this test can be used) for the duration of the COVID-19 declaration under Section 564(b)(1) of the Act, 21 U.S.C.section 360bbb-3(b)(1), unless the authorization is terminated  or revoked sooner.       Influenza A by PCR NEGATIVE NEGATIVE Final   Influenza B by PCR NEGATIVE NEGATIVE Final  Comment: (NOTE) The Xpert Xpress SARS-CoV-2/FLU/RSV plus assay is intended as an aid in the diagnosis of influenza from Nasopharyngeal swab specimens and should not be used as a sole basis for treatment. Nasal washings and aspirates are unacceptable for Xpert Xpress SARS-CoV-2/FLU/RSV testing.  Fact Sheet for Patients: EntrepreneurPulse.com.au  Fact Sheet for Healthcare Providers: IncredibleEmployment.be  This test is not yet approved or cleared by the [place] FDA and has been authorized for detection and/or diagnosis of SARS-CoV-2 by FDA under an Emergency Use Authorization (EUA). This EUA will remain in effect (meaning this test can be used) for the duration of the COVID-19 declaration under Section 564(b)(1) of the Act, 21 U.S.C. section 360bbb-3(b)(1), unless the authorization is terminated or revoked.  Performed at [facility], [place] [address], [address], [place] [address]          Radiology Studies: DG Abdomen Acute W/Chest  Result Date: 06/02/2020 CLINICAL DATA:  Nausea, vomiting. Generalized weakness. History of gastric cancer EXAM: DG ABDOMEN ACUTE WITH 1 VIEW CHEST COMPARISON:  05/21/2020 FINDINGS: Gastric stent is present. There is no evidence of dilated bowel loops or free intraperitoneal air. Moderate volume stool. There is some residual contrast throughout the colon. Sclerotic focus again noted within the right iliac bone. Right-sided Port-A-Cath terminates at the cavoatrial junction. Heart size and mediastinal contours are within normal limits. Both lungs are clear. IMPRESSION: 1. Nonobstructive bowel gas pattern. Moderate volume stool burden. 2. No acute cardiopulmonary process. Electronically Signed   By: Davina Poke D.O.   On: 06/02/2020 13:36        Scheduled Meds: . sodium chloride   Intravenous Once  . Chlorhexidine Gluconate Cloth  6 each Topical Daily  . magnesium oxide  400 mg Oral BID  . omega-3 acid ethyl esters  1 g Oral Daily  . pantoprazole (PROTONIX) IV  40 mg Intravenous BID  . potassium chloride  40 mEq Oral BID  . tamsulosin  0.4 mg Oral Daily   Continuous Infusions:   LOS: 1 day    Time spent: 35 mins.More than 50% of that time was spent in counseling and/or coordination of care.      Shelly Coss, MD [facility] P11/21/2021, 7:57 AM

## 2020-06-03 NOTE — Progress Notes (Signed)
Drew [name] 4:03 PM  Subjective: Patient without signs of bleeding but still with hiccoughs and inability to eat no other new complaints  Objective: Vital signs stable afebrile no acute distress abdomen is soft nontender except very minimal midepigastric awareness BUN and creatinine okay hemoglobin stable  Assessment: Gastric cancer on nonsteroidals with abnormal CT worrisome for stent ingrowth and seemingly resolved GI blood loss  Plan: We discussed endoscopy and possibly cleaning out his stent versus placing a new one inside and will plan on doing that tomorrow  [facility]  office [phone] After 5PM or if no answer call [phone]

## 2020-06-04 ENCOUNTER — Encounter (HOSPITAL_COMMUNITY)
Admission: EM | Disposition: A | Discharge status: Home / Self Care | Payer: Self-pay | Source: Home / Self Care | Attending: Internal Medicine

## 2020-06-04 ENCOUNTER — Inpatient Hospital Stay (HOSPITAL_COMMUNITY): Payer: Medicare PPO | Admitting: Registered Nurse

## 2020-06-04 ENCOUNTER — Encounter (HOSPITAL_COMMUNITY): Payer: Self-pay | Admitting: Internal Medicine

## 2020-06-04 ENCOUNTER — Inpatient Hospital Stay (HOSPITAL_COMMUNITY): Payer: Medicare PPO

## 2020-06-04 DIAGNOSIS — K922 Gastrointestinal hemorrhage, unspecified: Secondary | ICD-10-CM | POA: Diagnosis not present

## 2020-06-04 HISTORY — PX: ESOPHAGOGASTRODUODENOSCOPY (EGD) WITH PROPOFOL: SHX5813

## 2020-06-04 HISTORY — PX: DUODENAL STENT PLACEMENT: SHX5541

## 2020-06-04 LAB — HEMOGLOBIN A1C
Hgb A1c MFr Bld: 5.5 % (ref 4.8–5.6)
Mean Plasma Glucose: 111.15 mg/dL

## 2020-06-04 LAB — HEMOGLOBIN AND HEMATOCRIT, BLOOD
HCT: 25.9 % — ABNORMAL LOW (ref 39.0–52.0)
HCT: 27.4 % — ABNORMAL LOW (ref 39.0–52.0)
Hemoglobin: 8.2 g/dL — ABNORMAL LOW (ref 13.0–17.0)
Hemoglobin: 8.7 g/dL — ABNORMAL LOW (ref 13.0–17.0)

## 2020-06-04 SURGERY — ESOPHAGOGASTRODUODENOSCOPY (EGD) WITH PROPOFOL
Anesthesia: Monitor Anesthesia Care

## 2020-06-04 MED ORDER — SODIUM CHLORIDE 0.9 % IV SOLN
25.0000 mg | Freq: Four times a day (QID) | INTRAVENOUS | Status: DC | PRN
  Filled 2020-06-04 (×2): qty 1

## 2020-06-04 MED ORDER — ONDANSETRON HCL 4 MG/2ML IJ SOLN
INTRAMUSCULAR | Status: DC | PRN
  Administered 2020-06-04: 4 mg via INTRAVENOUS

## 2020-06-04 MED ORDER — PROPOFOL 500 MG/50ML IV EMUL
INTRAVENOUS | Status: DC | PRN
  Administered 2020-06-04: 140 ug/kg/min via INTRAVENOUS

## 2020-06-04 MED ORDER — LACTATED RINGERS IV SOLN: INTRAVENOUS | Status: DC | PRN

## 2020-06-04 MED ORDER — SODIUM CHLORIDE 0.9 % IV SOLN: INTRAVENOUS | Status: DC

## 2020-06-04 MED ORDER — PROPOFOL 500 MG/50ML IV EMUL
INTRAVENOUS | Status: AC
  Filled 2020-06-04: qty 50

## 2020-06-04 SURGICAL SUPPLY — 14 items

## 2020-06-04 NOTE — Discharge Instructions (Signed)
Upper Endoscopy, Adult, Care After  This sheet gives you information about how to care for yourself after your procedure. Your health care provider may also give you more specific instructions. If you have problems or questions, contact your health care provider.  What can I expect after the procedure?  After the procedure, it is common to have:  · A sore throat.  · Mild stomach pain or discomfort.  · Bloating.  · Nausea.  Follow these instructions at home:    · Follow instructions from your health care provider about what to eat or drink after your procedure.  · Return to your normal activities as told by your health care provider. Ask your health care provider what activities are safe for you.  · Take over-the-counter and prescription medicines only as told by your health care provider.  · Do not drive for 24 hours if you were given a sedative during your procedure.  · Keep all follow-up visits as told by your health care provider. This is important.  Contact a health care provider if you have:  · A sore throat that lasts longer than one day.  · Trouble swallowing.  Get help right away if:  · You vomit blood or your vomit looks like coffee grounds.  · You have:  ? A fever.  ? Bloody, black, or tarry stools.  ? A severe sore throat or you cannot swallow.  ? Difficulty breathing.  ? Severe pain in your chest or abdomen.  Summary  · After the procedure, it is common to have a sore throat, mild stomach discomfort, bloating, and nausea.  · Do not drive for 24 hours if you were given a sedative during the procedure.  · Follow instructions from your health care provider about what to eat or drink after your procedure.  · Return to your normal activities as told by your health care provider.  This information is not intended to replace advice given to you by your health care provider. Make sure you discuss any questions you have with your health care provider.  Document Revised: 12/22/2017 Document Reviewed:  11/30/2017  Elsevier Patient Education © 2020 [employer]

## 2020-06-04 NOTE — Op Note (Signed)
[facility] Patient Name: Drew [name] Procedure Date: 06/04/2020 MRN: [id number] Attending MD: Clarene Essex , MD Date of Birth: 05/15/1946 CSN: [id number] Age: 74 Admit Type: Inpatient Procedure:                Upper GI endoscopy Indications:              Acute post hemorrhagic anemia, Coffee-ground                            emesis, Hematemesis, Melena, Abnormal CT of the GI                            tract, Nausea with vomiting Providers:                Clarene Essex, MD, Cleda Daub, RN, Doristine Johns, RN, Tyrone Apple, Technician, Courtney Heys.                            [place], CRNA Referring MD:              Medicines:                Propofol total dose 350 mg IV, Ondansetron 4 mg IV Complications:            No immediate complications. Estimated Blood Loss:     Estimated blood loss was minimal. Procedure:                Pre-Anesthesia Assessment:                           - Prior to the procedure, a History and Physical                            was performed, and patient medications and                            allergies were reviewed. The patient's tolerance of                            previous anesthesia was also reviewed. The risks                            and benefits of the procedure and the sedation                            options and risks were discussed with the patient.                            All questions were answered, and informed consent                            was obtained. Prior Anticoagulants: The patient has  taken Eliquis (apixaban), last dose was 2 days                            prior to procedure. ASA Grade Assessment: III - A                            patient with severe systemic disease. After                            reviewing the risks and benefits, the patient was                            deemed in satisfactory condition to undergo the                             procedure.                           After obtaining informed consent, the endoscope was                            passed under direct vision. Throughout the                            procedure, the patient's blood pressure, pulse, and                            oxygen saturations were monitored continuously. The                            [id number]-[id number] ([id number] Olympus gastroscope was                            introduced through the mouth, and advanced to the                            duodenal bulb. After the endoscopy was completed                            and about 350 cc of dark material was suctioned                            from the stomach because of significant stenosis                            which we were barely able to get the scope through                            we elected to place a stent and exchanged the scope                            for the [id number]-[id number] ([id number]) Olympus therapeutic  endoscope was introduced through the and advanced                            to the duodenal bulb. The upper GI endoscopy was                            accomplished without difficulty. The patient                            tolerated the procedure well. Scope In: Scope Out: Findings:      The larynx was normal.      A small hiatal hernia was present.      Moderately severe esophagitis with no bleeding was found.      A large, fungating, polypoid and ulcerated, circumferential (involving       100% of the lumen circumference) mass with no bleeding and no stigmata       of recent bleeding was found in the gastric antrum. There was obvious       stenosis of the stent with significant tumor ingrowth and this was       stented with a 22 mm x 6 cm WallFlex stent using both fluoroscopy and       endoscopic guidance.      The exam was otherwise without abnormality. Impression:               - Normal larynx.                           - Small hiatal  hernia.                           - Moderately severe reflux esophagitis with no                            bleeding.                           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                            Prosthesis placed inside previous stent under                            fluoroscopy and endoscopic guidance as above.                           - The examination was otherwise normal.                           - No specimens collected. Moderate Sedation:      Not Applicable - Patient had care per Anesthesia. Recommendation:           - Clear liquid diet today. Very slowly advance                            tomorrow and reteach antireflux measures                           -  Continue present medications. Reevaluate need for                            blood thinners going forward but warned about                            aspirin and nonsteroidals                           - Return to GI clinic PRN.                           - Telephone GI clinic if symptomatic PRN. Procedure Code(s):        --- Professional ---                           [phone], Esophagogastroduodenoscopy, flexible,                            transoral; with placement of endoscopic stent                            (includes pre- and post-dilation and guide wire                            passage, when performed) Diagnosis Code(s):        --- Professional ---                           K44.9, Diaphragmatic hernia without obstruction or                            gangrene                           K21.00, Gastro-esophageal reflux disease with                            esophagitis, without bleeding                           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 All rights reserved. The codes documented in this report are preliminary and upon coder review may  be revised to meet current compliance requirements. Clarene Essex, MD 06/04/2020 2:57:01 PM This report has been signed electronically. Number of Addenda: 0

## 2020-06-04 NOTE — Progress Notes (Signed)
PROGRESS NOTE    Drew [name]  UGQ:[id number] DOB: 11/20/1945 DOA: 06/02/2020 PCP: Gaynelle Arabian, MD   Chief Complain: Generalized weakness  Brief Narrative: Patient is a 74 year old male with history of gastric cancer, PE on Eliquis, hypertension who presented with weakness, vomiting.  On presentation his hemoglobin was found to be 5.6.  Units of PRBC were given in the emergency department.  He was started on Protonix IV.  GI consulted and plan for EGD today.  Assessment & Plan:   Active Problems:   GIB (gastrointestinal bleeding)   GI bleed/likely upper: Presented with severe anemia.  Patient reported black stools at home .continue Protonix 40 mg IV twice daily.  Currently on clear liquid diet.  Plan for EGD today.  Denies any abdominal pain No bloody bowel movement/bloody stools after admission.  But he reported coffee-ground emesis last night.  Acute blood loss anemia: Hemoglobin in the range of 5 on presentation.  Status post units of PRBC.  Hemoglobin in the range of 8 today.  Continue to monitor CBC.  Gastric cancer: Has a gastric stent and follows with oncology.  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  Hemoglobin A1C of 5.5  Hypertension: Continue monitor blood pressure.  Home metoprolol on hold  BPH: Continue Flomax  Hiccups: Continue baclofen           DVT prophylaxis: SCD Code Status: DNR Family Communication: None at the bedside Status is: Inpatient  Remains inpatient appropriate because:Inpatient level of care appropriate due to severity of illness   Dispo: The patient is from: Home              Anticipated d/c is to: Home              Anticipated d/c date is: 1 day              Patient currently is not medically stable to d/c. Waiting for EGD, possible plan for discharge  tomorrow   Consultants: GI  Procedures:None  Antimicrobials:  Anti-infectives (From admission, onward)   None      Subjective: Patient seen and examined the bedside this morning.  Medically stable.  He reported coffee-ground emesis last night but denies any abdomen pain or black stools  Objective: Vitals:   06/02/20 2327 06/03/20 0549 06/03/20 2011 06/04/20 0554  BP: (!) 141/74 128/74 (!) 159/79 (!) 150/81  Pulse: 76 84 82 82  Resp:  16 16 16   Temp: 97.6 F (36.4 C) 97.7 F (36.5 C) 98.5 F (36.9 C) 98.4 F (36.9 C)  TempSrc: Oral Oral Oral Oral  SpO2: 100% 99% 100% 98%  Weight:      Height:        Intake/Output Summary (Last 24 hours) at 06/04/2020 0800 Last data filed at 06/04/2020 8828 Gross per 24 hour  Intake 470 ml  Output 302 ml  Net 168 ml   Filed Weights   06/02/20 1206 06/02/20 1745  Weight: 70.3 kg 68.6 kg    Examination:  General exam: Not in distress, chronically looking HEENT:PERRL,Oral mucosa moist, Ear/Nose normal on gross exam Respiratory system: Bilateral equal air entry, normal vesicular breath sounds, no wheezes or crackles  Cardiovascular system: S1 & S2 heard, RRR. No JVD, murmurs, rubs, gallops or clicks.  Chemo-Port on the right chest Gastrointestinal system: Abdomen is nondistended, soft and nontender. No organomegaly or  masses felt. Normal bowel sounds heard. Central nervous system: Alert and oriented. No focal neurological deficits. Extremities: No edema, no clubbing ,no cyanosis Skin: No rashes, lesions or ulcers,no icterus ,no pallor     Data Reviewed: I have personally reviewed following labs and imaging studies  CBC: Recent Labs  Lab 06/02/20 1228 06/03/20 0226 06/03/20 0853 06/03/20 2005 06/04/20 0411  WBC 7.4 8.0  --   --   --   NEUTROABS 5.8  --   --   --   --   HGB 5.6* 8.5* 8.3* 8.6* 8.2*  HCT 18.6* 26.0* 26.1* 27.4* 25.9*  MCV 90.7 89.0  --   --   --   PLT 206 201  --   --   --    Basic Metabolic  Panel: Recent Labs  Lab 06/02/20 1228 06/03/20 0838  NA 131* 138  K 3.2* 3.9  CL 92* 100  CO2 30 30  GLUCOSE 170* 129*  BUN 12 9  CREATININE 0.89 0.82  CALCIUM 8.4* 8.6*  MG 2.0  --    GFR: Estimated Creatinine Clearance: 74.9 mL/min (by C-G formula based on SCr of 0.82 mg/dL). Liver Function Tests: Recent Labs  Lab 06/02/20 1228 06/03/20 0838  AST 25 19  ALT 11 10  ALKPHOS 60 62  BILITOT 0.3 0.5  PROT 5.7* 5.8*  ALBUMIN 2.8* 2.8*   Recent Labs  Lab 06/02/20 1228  LIPASE 26   No results for input(s): AMMONIA in the last 168 hours. Coagulation Profile: No results for input(s): INR, PROTIME in the last 168 hours. Cardiac Enzymes: No results for input(s): CKTOTAL, CKMB, CKMBINDEX, TROPONINI in the last 168 hours. BNP (last 3 results) No results for input(s): PROBNP in the last 8760 hours. HbA1C: Recent Labs    06/03/20 0838  HGBA1C 5.5   CBG: No results for input(s): GLUCAP in the last 168 hours. Lipid Profile: No results for input(s): CHOL, HDL, LDLCALC, TRIG, CHOLHDL, LDLDIRECT in the last 72 hours. Thyroid Function Tests: No results for input(s): TSH, T4TOTAL, FREET4, T3FREE, THYROIDAB in the last 72 hours. Anemia Panel: No results for input(s): VITAMINB12, FOLATE, FERRITIN, TIBC, IRON, RETICCTPCT in the last 72 hours. Sepsis Labs: No results for input(s): PROCALCITON, LATICACIDVEN in the last 168 hours.  Recent Results (from the past 240 hour(s))  Resp Panel by RT-PCR (Flu A&B, Covid) Nasopharyngeal Swab     Status: None   Collection Time: 06/02/20  3:09 PM   Specimen: Nasopharyngeal Swab; Nasopharyngeal(NP) swabs in vial transport medium  Result Value Ref Range Status   SARS Coronavirus 2 by RT PCR NEGATIVE NEGATIVE Final    Comment: (NOTE) SARS-CoV-2 target nucleic acids are NOT DETECTED.  The SARS-CoV-2 RNA is generally detectable in upper respiratory specimens during the acute phase of infection. The lowest concentration of SARS-CoV-2 viral  copies this assay can detect is 138 copies/mL. A negative result does not preclude SARS-Cov-2 infection and should not be used as the sole basis for treatment or other patient management decisions. A negative result may occur with  improper specimen collection/handling, submission of specimen other than nasopharyngeal swab, presence of viral mutation(s) within the areas targeted by this assay, and inadequate number of viral copies(<138 copies/mL). A negative result must be combined with clinical observations, patient history, and epidemiological information. The expected result is Negative.  Fact Sheet for Patients:  EntrepreneurPulse.com.au  Fact Sheet for Healthcare Providers:  IncredibleEmployment.be  This test is no t yet approved or cleared by the [place] FDA  and  has been authorized for detection and/or diagnosis of SARS-CoV-2 by FDA under an Emergency Use Authorization (EUA). This EUA will remain  in effect (meaning this test can be used) for the duration of the COVID-19 declaration under Section 564(b)(1) of the Act, 21 U.S.C.section 360bbb-3(b)(1), unless the authorization is terminated  or revoked sooner.       Influenza A by PCR NEGATIVE NEGATIVE Final   Influenza B by PCR NEGATIVE NEGATIVE Final    Comment: (NOTE) The Xpert Xpress SARS-CoV-2/FLU/RSV plus assay is intended as an aid in the diagnosis of influenza from Nasopharyngeal swab specimens and should not be used as a sole basis for treatment. Nasal washings and aspirates are unacceptable for Xpert Xpress SARS-CoV-2/FLU/RSV testing.  Fact Sheet for Patients: EntrepreneurPulse.com.au  Fact Sheet for Healthcare Providers: IncredibleEmployment.be  This test is not yet approved or cleared by the [place] FDA and has been authorized for detection and/or diagnosis of SARS-CoV-2 by FDA under an Emergency Use Authorization (EUA). This  EUA will remain in effect (meaning this test can be used) for the duration of the COVID-19 declaration under Section 564(b)(1) of the Act, 21 U.S.C. section 360bbb-3(b)(1), unless the authorization is terminated or revoked.  Performed at [facility], [place] [address], [place], [place] [address]          Radiology Studies: DG Abdomen Acute W/Chest  Result Date: 06/02/2020 CLINICAL DATA:  Nausea, vomiting. Generalized weakness. History of gastric cancer EXAM: DG ABDOMEN ACUTE WITH 1 VIEW CHEST COMPARISON:  05/21/2020 FINDINGS: Gastric stent is present. There is no evidence of dilated bowel loops or free intraperitoneal air. Moderate volume stool. There is some residual contrast throughout the colon. Sclerotic focus again noted within the right iliac bone. Right-sided Port-A-Cath terminates at the cavoatrial junction. Heart size and mediastinal contours are within normal limits. Both lungs are clear. IMPRESSION: 1. Nonobstructive bowel gas pattern. Moderate volume stool burden. 2. No acute cardiopulmonary process. Electronically Signed   By: Davina Poke D.O.   On: 06/02/2020 13:36        Scheduled Meds: . sodium chloride   Intravenous Once  . Chlorhexidine Gluconate Cloth  6 each Topical Daily  . magnesium oxide  400 mg Oral BID  . omega-3 acid ethyl esters  1 g Oral Daily  . pantoprazole (PROTONIX) IV  40 mg Intravenous BID  . tamsulosin  0.4 mg Oral Daily   Continuous Infusions:   LOS: 2 days    Time spent: 35 mins.More than 50% of that time was spent in counseling and/or coordination of care.      Shelly Coss, MD [facility] P11/22/2021, 8:00 AM

## 2020-06-04 NOTE — Progress Notes (Signed)
Drew [name] 1:53 PM  Subjective: Patient still with vomiting even liquids but no obvious bleeding and no new complaints  Objective: Vital signs stable afebrile exam please see preassessment evaluation hemoglobin stable  Assessment: Upper GI bleeding with gastric cancer blood thinner and questionable stent occlusion  Plan: Okay to proceed with endoscopy possible restenting with anesthesia assistance  [facility] E  office [phone] After 5PM or if no answer call [phone]

## 2020-06-04 NOTE — Transfer of Care (Signed)
Immediate Anesthesia Transfer of Care Note  Patient: Drew [name]  Procedure(s) Performed: ESOPHAGOGASTRODUODENOSCOPY (EGD) WITH PROPOFOL and possible stenting with fluoroscopy (N/A ) ESOPHAGEAL STENT PLACEMENT (N/A )  Patient Location: PACU and Endoscopy Unit  Anesthesia Type:MAC  Level of Consciousness: awake, alert  and oriented  Airway & Oxygen Therapy: Patient Spontanous Breathing and Patient connected to face mask oxygen  Post-op Assessment: Report given to RN and Post -op Vital signs reviewed and stable  Post vital signs: Reviewed and stable  Last Vitals:  Vitals Value Taken Time  BP    Temp    Pulse 90 06/04/20 1445  Resp 21 06/04/20 1445  SpO2 100 % 06/04/20 1445  Vitals shown include unvalidated device data.  Last Pain:  Vitals:   06/04/20 1239  TempSrc: Oral  PainSc: 0-No pain         Complications: No complications documented.

## 2020-06-04 NOTE — Anesthesia Preprocedure Evaluation (Addendum)
Anesthesia Evaluation  Patient identified by MRN, date of birth, ID band Patient awake    Reviewed: Allergy & Precautions, NPO status , Patient's Chart, lab work & pertinent test results  Airway Mallampati: III  TM Distance: >3 FB Neck ROM: Full    Dental  (+) Edentulous Lower, Edentulous Upper, Dental Advisory Given   Pulmonary former smoker, PE   Pulmonary exam normal breath sounds clear to auscultation       Cardiovascular hypertension, Pt. on medications Normal cardiovascular exam Rhythm:Regular Rate:Normal  TTE 04/2020 1. Left ventricular ejection fraction, by estimation, is 60 to 65%. The left ventricle has normal function. The left ventricle has no regional wall motion abnormalities. Left ventricular diastolic parameters are consistent with Grade I diastolic dysfunction (impaired relaxation).  2. Right ventricular systolic function is normal. The right ventricular size is normal. There is normal pulmonary artery systolic pressure.  3. The mitral valve is normal in structure. Trivial mitral valve regurgitation. No evidence of mitral stenosis.  4. The aortic valve is tricuspid. Aortic valve regurgitation is not visualized. Mild to moderate aortic valve sclerosis/calcification is present, without any evidence of aortic stenosis.  5. The inferior vena cava is normal in size with greater than 50% respiratory variability, suggesting right atrial pressure of 3 mmHg.   Neuro/Psych CVA    GI/Hepatic PUD, GERD  Medicated and Controlled,  Endo/Other    Renal/GU      Musculoskeletal   Abdominal   Peds  Hematology  (+) Blood dyscrasia (Hgb 8.2, on eliquis), anemia ,   Anesthesia Other Findings Gastric CA GIB with symptomatic anemia  Reproductive/Obstetrics                           Anesthesia Physical Anesthesia Plan  ASA: III  Anesthesia Plan: MAC   Post-op Pain Management:    Induction:  Intravenous  PONV Risk Score and Plan: 1 and Propofol infusion and Treatment may vary due to age or medical condition  Airway Management Planned: Nasal Cannula  Additional Equipment:   Intra-op Plan:   Post-operative Plan:   Informed Consent: I have reviewed the patients History and Physical, chart, labs and discussed the procedure including the risks, benefits and alternatives for the proposed anesthesia with the patient or authorized representative who has indicated his/her understanding and acceptance.   Patient has DNR.  Discussed DNR with patient and Suspend DNR.   Dental advisory given  Plan Discussed with: CRNA and Surgeon  Anesthesia Plan Comments:        Anesthesia Quick Evaluation

## 2020-06-05 DIAGNOSIS — K922 Gastrointestinal hemorrhage, unspecified: Secondary | ICD-10-CM | POA: Diagnosis not present

## 2020-06-05 DIAGNOSIS — C169 Malignant neoplasm of stomach, unspecified: Secondary | ICD-10-CM | POA: Diagnosis not present

## 2020-06-05 LAB — CBC WITH DIFFERENTIAL/PLATELET
Abs Immature Granulocytes: 0.05 10*3/uL (ref 0.00–0.07)
Basophils Absolute: 0 10*3/uL (ref 0.0–0.1)
Basophils Relative: 0 %
Eosinophils Absolute: 0 10*3/uL (ref 0.0–0.5)
Eosinophils Relative: 0 %
HCT: 26.4 % — ABNORMAL LOW (ref 39.0–52.0)
Hemoglobin: 8.4 g/dL — ABNORMAL LOW (ref 13.0–17.0)
Immature Granulocytes: 1 %
Lymphocytes Relative: 9 %
Lymphs Abs: 1 10*3/uL (ref 0.7–4.0)
MCH: 29.1 pg (ref 26.0–34.0)
MCHC: 31.8 g/dL (ref 30.0–36.0)
MCV: 91.3 fL (ref 80.0–100.0)
Monocytes Absolute: 0.7 10*3/uL (ref 0.1–1.0)
Monocytes Relative: 6 %
Neutro Abs: 8.9 10*3/uL — ABNORMAL HIGH (ref 1.7–7.7)
Neutrophils Relative %: 84 %
Platelets: 230 10*3/uL (ref 150–400)
RBC: 2.89 MIL/uL — ABNORMAL LOW (ref 4.22–5.81)
RDW: 16.3 % — ABNORMAL HIGH (ref 11.5–15.5)
WBC: 10.7 10*3/uL — ABNORMAL HIGH (ref 4.0–10.5)
nRBC: 0 % (ref 0.0–0.2)

## 2020-06-05 MED ORDER — CHLORPROMAZINE HCL 25 MG PO TABS
25.0000 mg | ORAL_TABLET | Freq: Four times a day (QID) | ORAL | Status: DC | PRN
  Administered 2020-06-05 – 2020-06-06 (×2): 25 mg via ORAL
  Filled 2020-06-05 (×4): qty 1

## 2020-06-05 NOTE — Progress Notes (Signed)
Drew [name] 10:01 AM  Subjective: Patient doing well without signs of bleeding tolerating clear liquids no obvious complication from the procedure only complaint is hiccups  Objective: Vital signs stable afebrile no acute distress abdomen is soft nontender hemoglobin okay  Assessment: Gastric cancer status post restenting  Plan: We will advance diet to full liquids please call my rounding partner if any question during this hospital stay otherwise we are happy to see back as needed and I can see as an outpatient if needed by oncology  [facility] E  office [phone] After 5PM or if no answer call [phone]

## 2020-06-05 NOTE — Progress Notes (Signed)
PROGRESS NOTE    Drew [name]  YKD:[id number] DOB: 1946-02-16 DOA: 06/02/2020 PCP: Gaynelle Arabian, MD   Chief Complain: Generalized weakness  Brief Narrative: Patient is a 74 year old male with history of gastric cancer, PE on Eliquis, hypertension who presented with weakness, vomiting.  On presentation his hemoglobin was found to be 5.6.  Units of PRBC were given in the emergency department.  He was started on Protonix IV.  GI consulted and he underwent EGD on  06/04/20  Assessment & Plan:   Active Problems:   GIB (gastrointestinal bleeding)   Upper GI bleed: Presented with severe anemia.  Patient reported black stools at home . Underwent EGD on 06/04/2020 with finding of moderately severe esophagitis with no bleeding, large fungating/polypoid/ulcerated circumferential tumor involving 100% lumen in the gastric antrum.  Restenting done by GI. Continue PPI twice daily on discharge. GI recommended for slow advancement of diet.  We will continue full liquid diet today.  If he tolerates, he can be discharged on soft diet tomorrow  Acute blood loss anemia: Hemoglobin in the range of 5 on presentation.  Status post transfusion with 2 units of PRBC.  Hemoglobin in the range of 8 today.    Gastric cancer: Has a gastric stent and follows with oncology.  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  Hypokalemia: Supplemented and corrected  Hyperglycemia: Much better now.  Hemoglobin A1C of 5.5  Hypertension: Continue monitor blood pressure.  Home metoprolol on hold  BPH: Continue Flomax  Hiccups: Continue baclofen and thorazine          DVT prophylaxis: SCD Code Status: DNR Family Communication: Talked and discussed with wife on phone today Status is: Inpatient  Remains inpatient appropriate because:Inpatient level of care appropriate due to  severity of illness   Dispo: The patient is from: Home              Anticipated d/c is to: Home              Anticipated d/c date is: 1 day              Patient currently is not medically stable to d/c. Need to advance the diet to soft at least before discharge.   Consultants: GI  Procedures:None  Antimicrobials:  Anti-infectives (From admission, onward)   None      Subjective: Patient seen and examined at the bedside this morning.  Hemodynamically stable.  Comfortable.  Denies any abdomen pain, no new episodes of hematochezia or melena or coffee-ground emesis.  He was having hiccups which is bothering him today.  Objective: Vitals:   06/04/20 1440 06/04/20 1731 06/04/20 2013 06/05/20 0544  BP: 139/86 (!) 154/78 (!) 160/87 (!) 159/81  Pulse: 84 90 (!) 105 89  Resp: 20 19 15 14   Temp:  98.6 F (37 C) 98 F (36.7 C) 98 F (36.7 C)  TempSrc:  Oral Oral Oral  SpO2: 100% 99% 100% 98%  Weight:      Height:        Intake/Output Summary (Last 24 hours) at 06/05/2020 1256 Last data filed at 06/05/2020 1240 Gross per 24 hour  Intake 510 ml  Output 1 ml  Net 509 ml   Filed Weights   06/02/20 1206 06/02/20 1745  Weight: 70.3 kg 68.6 kg    Examination:  General exam: Appears calm and  comfortable ,Not in distress, chronically ill looking HEENT:PERRL,Oral mucosa moist, Ear/Nose normal on gross exam Respiratory system: Bilateral equal air entry, normal vesicular breath sounds, no wheezes or crackles  Cardiovascular system: S1 & S2 heard, RRR. No JVD, murmurs, rubs, gallops or clicks.  Chemo-Port on the right chest Gastrointestinal system: Abdomen is nondistended, soft and nontender. No organomegaly or masses felt. Normal bowel sounds heard. Central nervous system: Alert and oriented. No focal neurological deficits. Extremities: No edema, no clubbing ,no cyanosis Skin: No rashes, lesions or ulcers,no icterus ,no pallor    Data Reviewed: I have personally reviewed  following labs and imaging studies  CBC: Recent Labs  Lab 06/02/20 1228 06/02/20 1228 06/03/20 0226 06/03/20 0226 06/03/20 0853 06/03/20 2005 06/04/20 0411 06/04/20 1941 06/05/20 0555  WBC 7.4  --  8.0  --   --   --   --   --  10.7*  NEUTROABS 5.8  --   --   --   --   --   --   --  8.9*  HGB 5.6*   < > 8.5*   < > 8.3* 8.6* 8.2* 8.7* 8.4*  HCT 18.6*   < > 26.0*   < > 26.1* 27.4* 25.9* 27.4* 26.4*  MCV 90.7  --  89.0  --   --   --   --   --  91.3  PLT 206  --  201  --   --   --   --   --  230   < > = values in this interval not displayed.   Basic Metabolic Panel: Recent Labs  Lab 06/02/20 1228 06/03/20 0838  NA 131* 138  K 3.2* 3.9  CL 92* 100  CO2 30 30  GLUCOSE 170* 129*  BUN 12 9  CREATININE 0.89 0.82  CALCIUM 8.4* 8.6*  MG 2.0  --    GFR: Estimated Creatinine Clearance: 74.9 mL/min (by C-G formula based on SCr of 0.82 mg/dL). Liver Function Tests: Recent Labs  Lab 06/02/20 1228 06/03/20 0838  AST 25 19  ALT 11 10  ALKPHOS 60 62  BILITOT 0.3 0.5  PROT 5.7* 5.8*  ALBUMIN 2.8* 2.8*   Recent Labs  Lab 06/02/20 1228  LIPASE 26   No results for input(s): AMMONIA in the last 168 hours. Coagulation Profile: No results for input(s): INR, PROTIME in the last 168 hours. Cardiac Enzymes: No results for input(s): CKTOTAL, CKMB, CKMBINDEX, TROPONINI in the last 168 hours. BNP (last 3 results) No results for input(s): PROBNP in the last 8760 hours. HbA1C: Recent Labs    06/03/20 0838  HGBA1C 5.5   CBG: No results for input(s): GLUCAP in the last 168 hours. Lipid Profile: No results for input(s): CHOL, HDL, LDLCALC, TRIG, CHOLHDL, LDLDIRECT in the last 72 hours. Thyroid Function Tests: No results for input(s): TSH, T4TOTAL, FREET4, T3FREE, THYROIDAB in the last 72 hours. Anemia Panel: No results for input(s): VITAMINB12, FOLATE, FERRITIN, TIBC, IRON, RETICCTPCT in the last 72 hours. Sepsis Labs: No results for input(s): PROCALCITON, LATICACIDVEN in the  last 168 hours.  Recent Results (from the past 240 hour(s))  Resp Panel by RT-PCR (Flu A&B, Covid) Nasopharyngeal Swab     Status: None   Collection Time: 06/02/20  3:09 PM   Specimen: Nasopharyngeal Swab; Nasopharyngeal(NP) swabs in vial transport medium  Result Value Ref Range Status   SARS Coronavirus 2 by RT PCR NEGATIVE NEGATIVE Final    Comment: (NOTE) SARS-CoV-2 target nucleic acids are NOT DETECTED.  The SARS-CoV-2 RNA is generally detectable in upper respiratory specimens during the acute phase of infection. The lowest concentration of SARS-CoV-2 viral copies this assay can detect is 138 copies/mL. A negative result does not preclude SARS-Cov-2 infection and should not be used as the sole basis for treatment or other patient management decisions. A negative result may occur with  improper specimen collection/handling, submission of specimen other than nasopharyngeal swab, presence of viral mutation(s) within the areas targeted by this assay, and inadequate number of viral copies(<138 copies/mL). A negative result must be combined with clinical observations, patient history, and epidemiological information. The expected result is Negative.  Fact Sheet for Patients:  EntrepreneurPulse.com.au  Fact Sheet for Healthcare Providers:  IncredibleEmployment.be  This test is no t yet approved or cleared by the [place] FDA and  has been authorized for detection and/or diagnosis of SARS-CoV-2 by FDA under an Emergency Use Authorization (EUA). This EUA will remain  in effect (meaning this test can be used) for the duration of the COVID-19 declaration under Section 564(b)(1) of the Act, 21 U.S.C.section 360bbb-3(b)(1), unless the authorization is terminated  or revoked sooner.       Influenza A by PCR NEGATIVE NEGATIVE Final   Influenza B by PCR NEGATIVE NEGATIVE Final    Comment: (NOTE) The Xpert Xpress SARS-CoV-2/FLU/RSV plus assay is  intended as an aid in the diagnosis of influenza from Nasopharyngeal swab specimens and should not be used as a sole basis for treatment. Nasal washings and aspirates are unacceptable for Xpert Xpress SARS-CoV-2/FLU/RSV testing.  Fact Sheet for Patients: EntrepreneurPulse.com.au  Fact Sheet for Healthcare Providers: IncredibleEmployment.be  This test is not yet approved or cleared by the [place] FDA and has been authorized for detection and/or diagnosis of SARS-CoV-2 by FDA under an Emergency Use Authorization (EUA). This EUA will remain in effect (meaning this test can be used) for the duration of the COVID-19 declaration under Section 564(b)(1) of the Act, 21 U.S.C. section 360bbb-3(b)(1), unless the authorization is terminated or revoked.  Performed at [facility], [place] [address] [place], [place] [address]          Radiology Studies: DG C-Arm 1-60 Min  Result Date: 06/04/2020 CLINICAL DATA:  Duodenal stent placement EXAM: DG C-ARM 1-60 MIN CONTRAST:  None FLUOROSCOPY TIME:  Fluoroscopy Time:  1 minutes 18 seconds Radiation Exposure Index (if provided by the fluoroscopic device): Not available Number of Acquired Spot Images: 6 COMPARISON:  05/21/2020 FINDINGS: Multiple spot films were obtained during endoscopy with placement of a stent within a previously placed duodenal stent. IMPRESSION: Duodenal stent placement within a previously placed stent. Electronically Signed   By: Inez Catalina M.D.   On: 06/04/2020 14:57        Scheduled Meds: . sodium chloride   Intravenous Once  . Chlorhexidine Gluconate Cloth  6 each Topical Daily  . magnesium oxide  400 mg Oral BID  . omega-3 acid ethyl esters  1 g Oral Daily  . pantoprazole (PROTONIX) IV  40 mg Intravenous BID  . tamsulosin  0.4 mg Oral Daily   Continuous Infusions:   LOS: 3 days    Time spent: 35 mins.More than 50% of that time was spent in counseling  and/or coordination of care.      Shelly Coss, MD [facility] P11/23/2021, 12:56 PM

## 2020-06-05 NOTE — Care Management Important Message (Signed)
Important Message  Patient Details IM Letter given to the Patient. Name: UDAY JANTZ MRN: [id number] Date of Birth: 23-May-1946   Medicare Important Message Given:  Yes     Kerin Salen 06/05/2020, 10:47 AM

## 2020-06-05 NOTE — Anesthesia Postprocedure Evaluation (Signed)
Anesthesia Post Note  Patient: Drew [name]  Procedure(s) Performed: ESOPHAGOGASTRODUODENOSCOPY (EGD) WITH PROPOFOL and possible stenting with fluoroscopy (N/A ) DUODENAL STENT PLACEMENT (N/A )     Patient location during evaluation: Endoscopy Anesthesia Type: MAC Level of consciousness: awake and alert Pain management: pain level controlled Vital Signs Assessment: post-procedure vital signs reviewed and stable Respiratory status: spontaneous breathing, nonlabored ventilation, respiratory function stable and patient connected to nasal cannula oxygen Cardiovascular status: stable and blood pressure returned to baseline Postop Assessment: no apparent nausea or vomiting Anesthetic complications: no   No complications documented.  Last Vitals:  Vitals:   06/04/20 2013 06/05/20 0544  BP: (!) 160/87 (!) 159/81  Pulse: (!) 105 89  Resp: 15 14  Temp: 36.7 C 36.7 C  SpO2: 100% 98%    Last Pain:  Vitals:   06/05/20 0544  TempSrc: Oral  PainSc:                  Anyae Griffith L Stetson Pelaez

## 2020-06-05 NOTE — Progress Notes (Addendum)
HEMATOLOGY-ONCOLOGY PROGRESS NOTE  SUBJECTIVE: Mr. Adami presented to the hospital with weakness.  He has been having hiccups and vomiting at home.  He tried to eat and drink, but started to vomit.  In the ER, hemoglobin was found to be 5.6.  Noted to have dark stools.  GI was consulted and he underwent EGD yesterday.  He was noted to have moderately severe esophagitis with no bleeding, large fungating polypoid and ulcerated circumferential mass with no bleeding and no stigmata of recent bleeding found in the gastric antrum, obvious stenosis at the stent with significant tumor ingrowth and this was stented within the previous stent.  When seen this morning, denies abdominal pain, nausea, vomiting.  Taking in clear liquids.  Has not had any recent bowel movement.  No bleeding noted.  His biggest issue this morning is hiccups.  Oncology History  Gastric adenocarcinoma ([place])  01/17/2020 Initial Diagnosis   Gastric adenocarcinoma ([place])   01/31/2020 -  Chemotherapy   The patient had palonosetron (ALOXI) injection 0.25 mg, 0.25 mg, Intravenous,  Once, 9 of 9 cycles Administration: 0.25 mg (01/31/2020), 0.25 mg (02/15/2020), 0.25 mg (02/28/2020), 0.25 mg (03/14/2020), 0.25 mg (03/28/2020), 0.25 mg (04/10/2020), 0.25 mg (04/24/2020), 0.25 mg (05/08/2020), 0.25 mg (05/22/2020) leucovorin 604 mg in dextrose 5 % 250 mL infusion, 300 mg/m2 = 604 mg (100 % of original dose 300 mg/m2), Intravenous,  Once, 9 of 9 cycles Dose modification: 300 mg/m2 (original dose 300 mg/m2, Cycle 1, Reason: Provider Judgment) Administration: 604 mg (01/31/2020), 604 mg (02/15/2020), 604 mg (02/28/2020), 604 mg (03/14/2020), 604 mg (03/28/2020), 604 mg (04/10/2020), 604 mg (04/24/2020), 604 mg (05/08/2020) oxaliplatin (ELOXATIN) 170 mg in dextrose 5 % 500 mL chemo infusion, 85 mg/m2 = 170 mg, Intravenous,  Once, 9 of 9 cycles Administration: 170 mg (01/31/2020), 170 mg (02/15/2020), 170 mg (02/28/2020), 170 mg (03/14/2020), 170 mg (03/28/2020), 170 mg  (04/10/2020), 170 mg (04/24/2020), 170 mg (05/08/2020) fluorouracil (ADRUCIL) chemo injection 600 mg, 300 mg/m2 = 600 mg (100 % of original dose 300 mg/m2), Intravenous,  Once, 8 of 8 cycles Dose modification: 300 mg/m2 (original dose 300 mg/m2, Cycle 1, Reason: Provider Judgment) Administration: 600 mg (01/31/2020), 600 mg (02/15/2020), 600 mg (02/28/2020), 600 mg (03/14/2020), 600 mg (03/28/2020), 600 mg (04/10/2020), 600 mg (04/24/2020) fluorouracil (ADRUCIL) 4,000 mg in sodium chloride 0.9 % 70 mL chemo infusion, 2,000 mg/m2 = 4,000 mg (100 % of original dose 2,000 mg/m2), Intravenous, 1 Day/Dose, 9 of 10 cycles Dose modification: 2,000 mg/m2 (original dose 2,000 mg/m2, Cycle 1, Reason: Provider Judgment), 1,600 mg/m2 (original dose 2,000 mg/m2, Cycle 9, Reason: Provider Judgment) Administration: 4,000 mg (01/31/2020), 4,000 mg (02/15/2020), 4,000 mg (02/28/2020), 4,000 mg (03/14/2020), 4,000 mg (03/28/2020), 4,000 mg (04/10/2020), 4,000 mg (04/24/2020), 3,200 mg (05/08/2020), 3,200 mg (05/22/2020) trastuzumab-anns (KANJINTI) 504 mg in sodium chloride 0.9 % 250 mL chemo infusion, 6 mg/kg = 504 mg (100 % of original dose 6 mg/kg), Intravenous,  Once, 9 of 10 cycles Dose modification: 6 mg/kg (original dose 6 mg/kg, Cycle 1, Reason: Provider Judgment), 4 mg/kg (original dose 4 mg/kg, Cycle 2, Reason: Provider Judgment), 6 mg/kg (original dose 4 mg/kg, Cycle 10, Reason: Provider Judgment, Comment: Changing to q 3 weeks) Administration: 504 mg (01/31/2020), 300 mg (02/15/2020), 300 mg (02/28/2020), 300 mg (03/14/2020), 300 mg (03/28/2020), 300 mg (04/10/2020), 300 mg (04/24/2020), 300 mg (05/08/2020), 300 mg (05/22/2020)  for chemotherapy treatment.    01/31/2020 -  Chemotherapy   The patient had pembrolizumab (KEYTRUDA) 200 mg in sodium chloride 0.9 %  50 mL chemo infusion, 200 mg, Intravenous, Once, 6 of 7 cycles Administration: 200 mg (01/31/2020), 200 mg (03/14/2020), 200 mg (04/04/2020), 200 mg (05/15/2020), 200 mg (02/22/2020),  200 mg (04/24/2020)  for chemotherapy treatment.      PHYSICAL EXAMINATION:  Vitals:   06/04/20 2013 06/05/20 0544  BP: (!) 160/87 (!) 159/81  Pulse: (!) 105 89  Resp: 15 14  Temp: 98 F (36.7 C) 98 F (36.7 C)  SpO2: 100% 98%   Filed Weights   06/02/20 1206 06/02/20 1745  Weight: 70.3 kg 68.6 kg    Intake/Output from previous day: 11/22 0701 - 11/23 0700 In: 500 [I.V.:500] Out: 1 [Urine:1]  GENERAL:alert, no distress and comfortable SKIN: skin color, texture, turgor are normal, no rashes or significant lesions EYES: normal, Conjunctiva are pink and non-injected, sclera clear OROPHARYNX:no exudate, no erythema and lips, buccal mucosa, and tongue normal  LUNGS: clear to auscultation and percussion with normal breathing effort HEART: regular rate & rhythm and no murmurs and no lower extremity edema ABDOMEN:abdomen soft, non-tender and normal bowel sounds  NEURO: alert & oriented x 3 with fluent speech, no focal motor/sensory deficits  LABORATORY DATA:  I have reviewed the data as listed CMP Latest Ref Rng & Units 06/03/2020 06/02/2020 05/22/2020  Glucose 70 - 99 mg/dL 129(H) 170(H) 202(H)  BUN 8 - 23 mg/dL _0 Creatinine 0.61 - 1.24 mg/dL 0.82 0.89 0.85  Sodium 135 - 145 mmol/L 138 131(L) 139  Potassium 3.5 - 5.1 mmol/L 3.9 3.2(L) 3.7  Chloride 98 - 111 mmol/L 100 92(L) 103  CO2 22 - 32 mmol/L _1 Calcium 8.9 - 10.3 mg/dL 8.6(L) 8.4(L) 9.0  Total Protein 6.5 - 8.1 g/dL 5.8(L) 5.7(L) 6.3(L)  Total Bilirubin 0.3 - 1.2 mg/dL 0.5 0.3 0.4  Alkaline Phos 38 - 126 U/L 62 60 85  AST 15 - 41 U/L _2 ALT 0 - 44 U/L _3 Lab Results  Component Value Date   WBC 10.7 (H) 06/05/2020   HGB 8.4 (L) 06/05/2020   HCT 26.4 (L) 06/05/2020   MCV 91.3 06/05/2020   PLT 230 06/05/2020   NEUTROABS 8.9 (H) 06/05/2020    CT Abdomen Pelvis W Contrast  Result Date: 05/21/2020 CLINICAL DATA:  Restaging of metastatic gastric cancer, ongoing chemotherapy EXAM: CT  ABDOMEN AND PELVIS WITH CONTRAST TECHNIQUE: Multidetector CT imaging of the abdomen and pelvis was performed using the standard protocol following bolus administration of intravenous contrast. CONTRAST:  139m OMNIPAQUE IOHEXOL 300 MG/ML  SOLN COMPARISON:  03/27/2020 FINDINGS: Lower chest: Small right pleural effusion is reduced in size from prior. Right central line tip terminates at the cavoatrial junction. Mild coronary atherosclerosis observed along with descending thoracic aortic atherosclerosis. Faint reticulonodular opacities in the posterior basal segment right lower lobe on image 24 of series 5 through image 42 of series 5, probably inflammatory or due to atypical infectious bronchiolitis. Right pericardial lymph node 0.9 cm in short axis on image 19 of series 2, formerly the same. Hepatobiliary: Hepatic cysts noted with somewhat indistinct hepatic hypodense lesions which are less sharply defined than on the prior exam. A lesion in the dome of the right hepatic lobe measures about 2.0 by 1.8 cm on image 17 of series 2, and by my measurements was previously 2.0 by 1.7 cm. Taking into account the slightly reduced marginal definition of these lesions, the overall burden of hepatic metastatic disease appears similar to the prior exam.  Pancreas: Unremarkable Spleen: Unremarkable Adrenals/Urinary Tract: Small hypodense bilateral renal lesions are likely cysts although technically too small to characterize. Adrenal glands unremarkable. Urinary bladder unremarkable. Three small punctate left-sided renal calculi are each about 0.2 cm in diameter. Stomach/Bowel: Marked irregular gastric wall thickening in the gastric antrum traversed by a gastric stent. Contrast medium is present along the lumen of the stent, with the contrast column narrowed to about 0.5 cm centrally along the stent. Prominent stool throughout the colon favors constipation. Vascular/Lymphatic: Aortoiliac atherosclerotic vascular disease. Small  aortocaval lymph nodes appear similar to prior. Reproductive: Unremarkable Other: Again observed is considerable omental caking of tumor. Right omental caking measures 2.0 cm in thickness on image 47 of series 2, previously 1.7 cm. Scattered tumor nodularity in the omentum observed. Mild perihepatic ascites, likely malignant. Increased nodularity along the left upper paracolic gutter for example on image 36 of series 2. Musculoskeletal: Stable sclerosis in the right iliac bone, probably a benign chondroid lesion. IMPRESSION: 1. Mildly worsened omental caking of tumor. Mild perihepatic ascites, likely malignant. 2. Marked irregular gastric wall thickening in the gastric antrum traversed by a gastric stent. Contrast medium is present along the lumen of the stent, with the contrast column narrowed to about 0.5 cm centrally along the stent. 3. Other imaging findings of potential clinical significance: Faint reticulonodular opacities in the posterior basal segment right lower lobe, probably inflammatory or due to atypical infectious bronchiolitis. Small right pleural effusion is reduced in size from prior. Prominent stool throughout the colon favors constipation. Nonobstructive left nephrolithiasis. 4. Aortic atherosclerosis. Aortic Atherosclerosis (ICD10-I70.0). Electronically Signed   By: Van Clines M.D.   On: 05/21/2020 11:00   DG Abdomen Acute W/Chest  Result Date: 06/02/2020 CLINICAL DATA:  Nausea, vomiting. Generalized weakness. History of gastric cancer EXAM: DG ABDOMEN ACUTE WITH 1 VIEW CHEST COMPARISON:  05/21/2020 FINDINGS: Gastric stent is present. There is no evidence of dilated bowel loops or free intraperitoneal air. Moderate volume stool. There is some residual contrast throughout the colon. Sclerotic focus again noted within the right iliac bone. Right-sided Port-A-Cath terminates at the cavoatrial junction. Heart size and mediastinal contours are within normal limits. Both lungs are clear.  IMPRESSION: 1. Nonobstructive bowel gas pattern. Moderate volume stool burden. 2. No acute cardiopulmonary process. Electronically Signed   By: Davina Poke D.O.   On: 06/02/2020 13:36   DG C-Arm 1-60 Min  Result Date: 06/04/2020 CLINICAL DATA:  Duodenal stent placement EXAM: DG C-ARM 1-60 MIN CONTRAST:  None FLUOROSCOPY TIME:  Fluoroscopy Time:  1 minutes 18 seconds Radiation Exposure Index (if provided by the fluoroscopic device): Not available Number of Acquired Spot Images: 6 COMPARISON:  05/21/2020 FINDINGS: Multiple spot films were obtained during endoscopy with placement of a stent within a previously placed duodenal stent. IMPRESSION: Duodenal stent placement within a previously placed stent. Electronically Signed   By: Inez Catalina M.D.   On: 06/04/2020 14:57    ASSESSMENT AND PLAN: 1. 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  CT abdomen/pelvis at [facility] 01/03/2020-masslike wall thickening of the gastric antrum, numerous liver metastases, small volume ascites, numerous peritoneal nodules, enlarged periaortic and porta hepatis lymph nodes  Upper endoscopy 01/06/2020-large ulcer in the gastric antrum with heaped up and firm edges, Duodenal bulb largely "obliterated" by the mass, biopsy confirmed adenocarcinoma, HER-2 3+, intact mismatch repair protein expression, PD-L1: 4   CT chest 01/10/2020-acute bilateral pulmonary emboli, no lymphadenopathy or suspicious lung nodules  Baseline echo 01/17/20 at [facility], EF 60-65%  CT abdomen/pelvis 01/22/2020, gastric mass, liver metastases, peritoneal/omental nodules, ascites, right pleural effusion  Therapeutic and diagnostic paracentesis on 01/27/2020, 2.1 L removed;cytology shows malignant cells,malignant ascites  PD-L1combined positive score: 1 (from ascites/periteoneal fluid), reported 02/01/20   Cycle 1 FOLFOX with trastuzumab and pembrolizumab, 01/31/2020  Baseline CEA 13.6 (02/08/2020)  Cycle 2  FOLFOX/trastuzumab 02/15/20  Pembrolizumab 02/22/2020  Cycle 3 FOLFOX/trastuzumab 02/28/2020  Cycle 4 FOLFOX/trastuzumab 03/14/2020  Pembrolizumab 03/14/2020  CTs 03/27/2020-multifocal liver metastases again noted, appear mildly decreased in size in the interval. Persistent diffuse peritoneal disease with mild decrease in size of index peritoneal lesions. Similar volume of upper abdominal ascites. Small right pleural effusion is decreased.  Cycle 5 FOLFOX/trastuzumab 03/28/2020  Pembrolizumab 9/22/202  Cycle 6 FOLFOX/trastuzumab 04/10/2020  Pembrolizumab 04/24/2020  Cycle 7 FOLFOX/trastuzumab 04/24/2020  Cycle 8 FOLFOX/trastuzumab 05/08/2020  Pembrolizumab 05/15/2020  CTs 05/21/2020-mildly worsened omental caking of tumor.  Mild perihepatic ascites.  Marked irregular gastric wall thickening in the gastric antrum traversed by gastric stent.  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  Hospital admission 06/02/2020-upper GI bleed and  anemia  Mr. Letizia was admitted secondary to subacute GI bleed.  EGD performed which showed moderately severe esophagitis and stenosis of the stent with significant tumor ingrowth.  He has no recurrent nausea or vomiting this morning.  Currently on clear liquids and slowly advancing diet.  He received 2 units PRBCs this admission with stabilization of his hemoglobin this morning.  He does not see any recurrent bleeding.  He continues to have hiccups and has orders for Thorazine and baclofen.  Recommendations: 1.  Advance diet as tolerated per GI. 2.  Continue Thorazine and baclofen for hiccups. 3.  Unclear if tumor ingrowth into stent indicates further disease progression but will consider possible treatment change as an outpatient in the future. 4.  Monitor CBC closely. 5.  Hold anticoagulation therapy    LOS: 3 days   Mikey Bussing, DNP, AGPCNP-BC, AOCNP 06/05/20 Mr. Hallisey was interviewed and examined.  His daughter was present by telephone for today's visit.  He was admitted with nausea/vomiting and severe anemia.  Dr. Watt Climes noted tumor ingrowth into the gastric antral stent.  A new stent was placed. I suspect the stent occlusion is indicative of slow progression of the gastric cancer.  Mr. Mende will follow up as scheduled at the cancer center next week.  We will discuss options of comfort care versus changing to a different systemic therapy regimen.  I will discuss the indication for resuming anticoagulation therapy with Mr. Bain.

## 2020-06-06 ENCOUNTER — Encounter (HOSPITAL_COMMUNITY): Payer: Self-pay | Admitting: Gastroenterology

## 2020-06-06 DIAGNOSIS — C169 Malignant neoplasm of stomach, unspecified: Secondary | ICD-10-CM | POA: Diagnosis not present

## 2020-06-06 DIAGNOSIS — K922 Gastrointestinal hemorrhage, unspecified: Secondary | ICD-10-CM | POA: Diagnosis not present

## 2020-06-06 LAB — CBC WITH DIFFERENTIAL/PLATELET
Abs Immature Granulocytes: 0.02 10*3/uL (ref 0.00–0.07)
Basophils Absolute: 0 10*3/uL (ref 0.0–0.1)
Basophils Relative: 0 %
Eosinophils Absolute: 0.1 10*3/uL (ref 0.0–0.5)
Eosinophils Relative: 1 %
HCT: 25.5 % — ABNORMAL LOW (ref 39.0–52.0)
Hemoglobin: 8 g/dL — ABNORMAL LOW (ref 13.0–17.0)
Immature Granulocytes: 0 %
Lymphocytes Relative: 12 %
Lymphs Abs: 0.8 10*3/uL (ref 0.7–4.0)
MCH: 28.5 pg (ref 26.0–34.0)
MCHC: 31.4 g/dL (ref 30.0–36.0)
MCV: 90.7 fL (ref 80.0–100.0)
Monocytes Absolute: 0.6 10*3/uL (ref 0.1–1.0)
Monocytes Relative: 9 %
Neutro Abs: 5.3 10*3/uL (ref 1.7–7.7)
Neutrophils Relative %: 78 %
Platelets: 205 10*3/uL (ref 150–400)
RBC: 2.81 MIL/uL — ABNORMAL LOW (ref 4.22–5.81)
RDW: 16 % — ABNORMAL HIGH (ref 11.5–15.5)
WBC: 6.8 10*3/uL (ref 4.0–10.5)
nRBC: 0 % (ref 0.0–0.2)

## 2020-06-06 MED ORDER — HEPARIN SOD (PORK) LOCK FLUSH 100 UNIT/ML IV SOLN
500.0000 [IU] | Freq: Once | INTRAVENOUS | Status: AC
  Administered 2020-06-06: 500 [IU] via INTRAVENOUS
  Filled 2020-06-06: qty 5

## 2020-06-06 MED ORDER — ELIQUIS 5 MG PO TABS: 5.0000 mg | ORAL_TABLET | Freq: Two times a day (BID) | ORAL | 0 refills | Status: DC

## 2020-06-06 NOTE — Progress Notes (Signed)
Reviewed AVS with Patient and Family member.   Answered all questions.   Patient alert and oriented, skin warm and dry.  Transferred to the care of the family member without incident.

## 2020-06-06 NOTE — Discharge Summary (Signed)
Physician Discharge Summary  KENDERICK KOBLER LOV:[id number] DOB: 1945/11/16 DOA: 06/02/2020  PCP: Gaynelle Arabian, MD  Admit date: 06/02/2020 Discharge date: 06/06/2020  Admitted From: Home Disposition: Home  Recommendations for Outpatient Follow-up:  1. Follow up with PCP in 1-2 weeks 2. Please obtain BMP/CBC in one week 3. Please follow up on the following pending results:  Discharge Condition: Stable CODE STATUS: DNR Diet recommendation: As tolerated  Brief/Interim Summary: Patient is a 74 year old male with history of gastric cancer, PE on Eliquis, hypertension who presented with weakness, vomiting.  On presentation his hemoglobin was found to be 5.6.  Units of PRBC were given in the emergency department.  He was started on Protonix IV.  GI consulted and he underwent EGD on 06/04/20.  As below patient admitted with upper GI bleed status post transfusion, EGD unremarkable for any acute bleeding, hemoglobin now stable, discharge with advancing diet per GI recommendations.  Outpatient follow-up as scheduled with PCP and oncology; GI follow-up as needed.   Discharge Diagnoses:  Active Problems:   GIB (gastrointestinal bleeding)    Discharge Instructions  Discharge Instructions    Call MD for:  extreme fatigue   Complete by: As directed    Call MD for:  persistant dizziness or light-headedness   Complete by: As directed    Diet - low sodium heart healthy   Complete by: As directed    Discharge instructions   Complete by: As directed    Hold eliquis through 06/15/20 as discussed. Monitor for sign/symptoms of bleeding.   Increase activity slowly   Complete by: As directed      Allergies as of 06/06/2020   No Known Allergies     Medication List    TAKE these medications   acetaminophen 500 MG tablet Commonly known as: TYLENOL Take 1,000 mg by mouth every 6 (six) hours as needed for mild pain.   atorvastatin 10 MG tablet Commonly known as: LIPITOR Take 1 tablet (10  mg total) by mouth daily.   baclofen 10 MG tablet Commonly known as: LIORESAL Take 1 tablet (10 mg total) by mouth 3 (three) times daily as needed for muscle spasms.   diphenoxylate-atropine 2.5-0.025 MG tablet Commonly known as: LOMOTIL TAKE 1-2 TABLETS BY MOUTH FOUR TIMES DAILY AS NEEDED FOR DIARHEA OR LOOSE STOOLS What changed: See the new instructions.   Eliquis 5 MG Tabs tablet Generic drug: apixaban Take 1 tablet (5 mg total) by mouth 2 (two) times daily. Start taking on: June 15, 2020 What changed: These instructions start on June 15, 2020. If you are unsure what to do until then, ask your doctor or other care provider.   FeroSul 325 (65 FE) MG tablet Generic drug: ferrous sulfate Take 1 tablet (325 mg total) by mouth daily.   Fish Oil 1000 MG Caps Take 2 capsules by mouth daily.   HYDROcodone-acetaminophen 5-325 MG tablet Commonly known as: NORCO/VICODIN Take 1 tablet by mouth every 6 (six) hours as needed for moderate pain.   lidocaine-prilocaine cream Commonly known as: EMLA Apply 1 application topically as needed. What changed: reasons to take this   magnesium oxide 400 (241.3 Mg) MG tablet Commonly known as: MAG-OX Take 1 tablet (400 mg total) by mouth 2 (two) times daily.   metoprolol succinate 25 MG 24 hr tablet Commonly known as: TOPROL-XL Take 1 tablet (25 mg total) by mouth daily.   pantoprazole 40 MG tablet Commonly known as: Protonix Take 1 tablet (40 mg total) by mouth 2 (two) times  daily.   polyethylene glycol 17 g packet Commonly known as: MIRALAX / GLYCOLAX Take 17 g by mouth daily as needed.   potassium chloride SA 20 MEQ tablet Commonly known as: KLOR-CON TAKE 1 TABLET(20 MEQ) BY MOUTH DAILY What changed: See the new instructions.   prochlorperazine 10 MG tablet Commonly known as: COMPAZINE Take 1 tablet (10 mg total) by mouth every 6 (six) hours as needed for nausea or vomiting.   tamsulosin 0.4 MG Caps capsule Commonly known  as: FLOMAX TAKE 1 CAPSULE(0.4 MG) BY MOUTH DAILY What changed: See the new instructions.       No Known Allergies  Consultations: GI  Procedures/Studies: CT Abdomen Pelvis W Contrast  Result Date: 05/21/2020 CLINICAL DATA:  Restaging of metastatic gastric cancer, ongoing chemotherapy EXAM: CT ABDOMEN AND PELVIS WITH CONTRAST TECHNIQUE: Multidetector CT imaging of the abdomen and pelvis was performed using the standard protocol following bolus administration of intravenous contrast. CONTRAST:  113mL OMNIPAQUE IOHEXOL 300 MG/ML  SOLN COMPARISON:  03/27/2020 FINDINGS: Lower chest: Small right pleural effusion is reduced in size from prior. Right central line tip terminates at the cavoatrial junction. Mild coronary atherosclerosis observed along with descending thoracic aortic atherosclerosis. Faint reticulonodular opacities in the posterior basal segment right lower lobe on image 24 of series 5 through image 42 of series 5, probably inflammatory or due to atypical infectious bronchiolitis. Right pericardial lymph node 0.9 cm in short axis on image 19 of series 2, formerly the same. Hepatobiliary: Hepatic cysts noted with somewhat indistinct hepatic hypodense lesions which are less sharply defined than on the prior exam. A lesion in the dome of the right hepatic lobe measures about 2.0 by 1.8 cm on image 17 of series 2, and by my measurements was previously 2.0 by 1.7 cm. Taking into account the slightly reduced marginal definition of these lesions, the overall burden of hepatic metastatic disease appears similar to the prior exam. Pancreas: Unremarkable Spleen: Unremarkable Adrenals/Urinary Tract: Small hypodense bilateral renal lesions are likely cysts although technically too small to characterize. Adrenal glands unremarkable. Urinary bladder unremarkable. Three small punctate left-sided renal calculi are each about 0.2 cm in diameter. Stomach/Bowel: Marked irregular gastric wall thickening in the  gastric antrum traversed by a gastric stent. Contrast medium is present along the lumen of the stent, with the contrast column narrowed to about 0.5 cm centrally along the stent. Prominent stool throughout the colon favors constipation. Vascular/Lymphatic: Aortoiliac atherosclerotic vascular disease. Small aortocaval lymph nodes appear similar to prior. Reproductive: Unremarkable Other: Again observed is considerable omental caking of tumor. Right omental caking measures 2.0 cm in thickness on image 47 of series 2, previously 1.7 cm. Scattered tumor nodularity in the omentum observed. Mild perihepatic ascites, likely malignant. Increased nodularity along the left upper paracolic gutter for example on image 36 of series 2. Musculoskeletal: Stable sclerosis in the right iliac bone, probably a benign chondroid lesion. IMPRESSION: 1. Mildly worsened omental caking of tumor. Mild perihepatic ascites, likely malignant. 2. Marked irregular gastric wall thickening in the gastric antrum traversed by a gastric stent. Contrast medium is present along the lumen of the stent, with the contrast column narrowed to about 0.5 cm centrally along the stent. 3. Other imaging findings of potential clinical significance: Faint reticulonodular opacities in the posterior basal segment right lower lobe, probably inflammatory or due to atypical infectious bronchiolitis. Small right pleural effusion is reduced in size from prior. Prominent stool throughout the colon favors constipation. Nonobstructive left nephrolithiasis. 4. Aortic atherosclerosis. Aortic Atherosclerosis (  ICD10-I70.0). Electronically Signed   By: Van Clines M.D.   On: 05/21/2020 11:00   DG Abdomen Acute W/Chest  Result Date: 06/02/2020 CLINICAL DATA:  Nausea, vomiting. Generalized weakness. History of gastric cancer EXAM: DG ABDOMEN ACUTE WITH 1 VIEW CHEST COMPARISON:  05/21/2020 FINDINGS: Gastric stent is present. There is no evidence of dilated bowel loops or  free intraperitoneal air. Moderate volume stool. There is some residual contrast throughout the colon. Sclerotic focus again noted within the right iliac bone. Right-sided Port-A-Cath terminates at the cavoatrial junction. Heart size and mediastinal contours are within normal limits. Both lungs are clear. IMPRESSION: 1. Nonobstructive bowel gas pattern. Moderate volume stool burden. 2. No acute cardiopulmonary process. Electronically Signed   By: Davina Poke D.O.   On: 06/02/2020 13:36   DG C-Arm 1-60 Min  Result Date: 06/04/2020 CLINICAL DATA:  Duodenal stent placement EXAM: DG C-ARM 1-60 MIN CONTRAST:  None FLUOROSCOPY TIME:  Fluoroscopy Time:  1 minutes 18 seconds Radiation Exposure Index (if provided by the fluoroscopic device): Not available Number of Acquired Spot Images: 6 COMPARISON:  05/21/2020 FINDINGS: Multiple spot films were obtained during endoscopy with placement of a stent within a previously placed duodenal stent. IMPRESSION: Duodenal stent placement within a previously placed stent. Electronically Signed   By: Inez Catalina M.D.   On: 06/04/2020 14:57     Subjective: No acute issues or events overnight, tolerating p.o. well denies nausea, vomiting, diarrhea, constipation, hematochezia, hemoptysis, melena, headache, fevers, chills.   Discharge Exam: Vitals:   06/05/20 2143 06/06/20 0627  BP: (!) 162/90 (!) 157/80  Pulse: 98 (!) 108  Resp: 20 20  Temp: 98.3 F (36.8 C) 98.1 F (36.7 C)  SpO2: 100% 99%   Vitals:   06/05/20 0544 06/05/20 1353 06/05/20 2143 06/06/20 0627  BP: (!) 159/81 (!) 141/85 (!) 162/90 (!) 157/80  Pulse: 89 96 98 (!) 108  Resp: 14 18 20 20   Temp: 98 F (36.7 C) 98.1 F (36.7 C) 98.3 F (36.8 C) 98.1 F (36.7 C)  TempSrc: Oral Oral Oral Oral  SpO2: 98% 100% 100% 99%  Weight:      Height:        General: Pt is alert, awake, not in acute distress Cardiovascular: RRR, S1/S2 +, no rubs, no gallops Respiratory: CTA bilaterally, no wheezing,  no rhonchi Abdominal: Soft, NT, ND, bowel sounds + Extremities: no edema, no cyanosis    The results of significant diagnostics from this hospitalization (including imaging, microbiology, ancillary and laboratory) are listed below for reference.     Microbiology: Recent Results (from the past 240 hour(s))  Resp Panel by RT-PCR (Flu A&B, Covid) Nasopharyngeal Swab     Status: None   Collection Time: 06/02/20  3:09 PM   Specimen: Nasopharyngeal Swab; Nasopharyngeal(NP) swabs in vial transport medium  Result Value Ref Range Status   SARS Coronavirus 2 by RT PCR NEGATIVE NEGATIVE Final    Comment: (NOTE) SARS-CoV-2 target nucleic acids are NOT DETECTED.  The SARS-CoV-2 RNA is generally detectable in upper respiratory specimens during the acute phase of infection. The lowest concentration of SARS-CoV-2 viral copies this assay can detect is 138 copies/mL. A negative result does not preclude SARS-Cov-2 infection and should not be used as the sole basis for treatment or other patient management decisions. A negative result may occur with  improper specimen collection/handling, submission of specimen other than nasopharyngeal swab, presence of viral mutation(s) within the areas targeted by this assay, and inadequate number of viral copies(<138  copies/mL). A negative result must be combined with clinical observations, patient history, and epidemiological information. The expected result is Negative.  Fact Sheet for Patients:  EntrepreneurPulse.com.au  Fact Sheet for Healthcare Providers:  IncredibleEmployment.be  This test is no t yet approved or cleared by the [place] FDA and  has been authorized for detection and/or diagnosis of SARS-CoV-2 by FDA under an Emergency Use Authorization (EUA). This EUA will remain  in effect (meaning this test can be used) for the duration of the COVID-19 declaration under Section 564(b)(1) of the Act,  21 U.S.C.section 360bbb-3(b)(1), unless the authorization is terminated  or revoked sooner.       Influenza A by PCR NEGATIVE NEGATIVE Final   Influenza B by PCR NEGATIVE NEGATIVE Final    Comment: (NOTE) The Xpert Xpress SARS-CoV-2/FLU/RSV plus assay is intended as an aid in the diagnosis of influenza from Nasopharyngeal swab specimens and should not be used as a sole basis for treatment. Nasal washings and aspirates are unacceptable for Xpert Xpress SARS-CoV-2/FLU/RSV testing.  Fact Sheet for Patients: EntrepreneurPulse.com.au  Fact Sheet for Healthcare Providers: IncredibleEmployment.be  This test is not yet approved or cleared by the [place] FDA and has been authorized for detection and/or diagnosis of SARS-CoV-2 by FDA under an Emergency Use Authorization (EUA). This EUA will remain in effect (meaning this test can be used) for the duration of the COVID-19 declaration under Section 564(b)(1) of the Act, 21 U.S.C. section 360bbb-3(b)(1), unless the authorization is terminated or revoked.  Performed at [facility], [place] [address], [place], [place] [address]      Labs: BNP (last 3 results) No results for input(s): BNP in the last 8760 hours. Basic Metabolic Panel: Recent Labs  Lab 06/02/20 1228 06/03/20 0838  NA 131* 138  K 3.2* 3.9  CL 92* 100  CO2 30 30  GLUCOSE 170* 129*  BUN 12 9  CREATININE 0.89 0.82  CALCIUM 8.4* 8.6*  MG 2.0  --    Liver Function Tests: Recent Labs  Lab 06/02/20 1228 06/03/20 0838  AST 25 19  ALT 11 10  ALKPHOS 60 62  BILITOT 0.3 0.5  PROT 5.7* 5.8*  ALBUMIN 2.8* 2.8*   Recent Labs  Lab 06/02/20 1228  LIPASE 26   No results for input(s): AMMONIA in the last 168 hours. CBC: Recent Labs  Lab 06/02/20 1228 06/02/20 1228 06/03/20 0226 06/03/20 0853 06/03/20 2005 06/04/20 0411 06/04/20 1941 06/05/20 0555 06/06/20 0641  WBC 7.4  --  8.0  --   --   --   --   10.7* 6.8  NEUTROABS 5.8  --   --   --   --   --   --  8.9* 5.3  HGB 5.6*   < > 8.5*   < > 8.6* 8.2* 8.7* 8.4* 8.0*  HCT 18.6*   < > 26.0*   < > 27.4* 25.9* 27.4* 26.4* 25.5*  MCV 90.7  --  89.0  --   --   --   --  91.3 90.7  PLT 206  --  201  --   --   --   --  230 205   < > = values in this interval not displayed.   Cardiac Enzymes: No results for input(s): CKTOTAL, CKMB, CKMBINDEX, TROPONINI in the last 168 hours. BNP: Invalid input(s): POCBNP CBG: No results for input(s): GLUCAP in the last 168 hours. D-Dimer No results for input(s): DDIMER in the last 72 hours. Hgb  A1c No results for input(s): HGBA1C in the last 72 hours. Lipid Profile No results for input(s): CHOL, HDL, LDLCALC, TRIG, CHOLHDL, LDLDIRECT in the last 72 hours. Thyroid function studies No results for input(s): TSH, T4TOTAL, T3FREE, THYROIDAB in the last 72 hours.  Invalid input(s): FREET3 Anemia work up No results for input(s): VITAMINB12, FOLATE, FERRITIN, TIBC, IRON, RETICCTPCT in the last 72 hours. Urinalysis    Component Value Date/Time   COLORURINE YELLOW 06/02/2020 1228   APPEARANCEUR CLEAR 06/02/2020 1228   LABSPEC 1.009 06/02/2020 1228   PHURINE 7.0 06/02/2020 1228   GLUCOSEU NEGATIVE 06/02/2020 1228   HGBUR NEGATIVE 06/02/2020 [place] 06/02/2020 1228   KETONESUR NEGATIVE 06/02/2020 1228   PROTEINUR NEGATIVE 06/02/2020 1228   UROBILINOGEN 0.2 09/25/2011 2206   NITRITE NEGATIVE 06/02/2020 [place] 06/02/2020 1228   Sepsis Labs Invalid input(s): PROCALCITONIN,  WBC,  LACTICIDVEN Microbiology Recent Results (from the past 240 hour(s))  Resp Panel by RT-PCR (Flu A&B, Covid) Nasopharyngeal Swab     Status: None   Collection Time: 06/02/20  3:09 PM   Specimen: Nasopharyngeal Swab; Nasopharyngeal(NP) swabs in vial transport medium  Result Value Ref Range Status   SARS Coronavirus 2 by RT PCR NEGATIVE NEGATIVE Final    Comment: (NOTE) SARS-CoV-2 target  nucleic acids are NOT DETECTED.  The SARS-CoV-2 RNA is generally detectable in upper respiratory specimens during the acute phase of infection. The lowest concentration of SARS-CoV-2 viral copies this assay can detect is 138 copies/mL. A negative result does not preclude SARS-Cov-2 infection and should not be used as the sole basis for treatment or other patient management decisions. A negative result may occur with  improper specimen collection/handling, submission of specimen other than nasopharyngeal swab, presence of viral mutation(s) within the areas targeted by this assay, and inadequate number of viral copies(<138 copies/mL). A negative result must be combined with clinical observations, patient history, and epidemiological information. The expected result is Negative.  Fact Sheet for Patients:  EntrepreneurPulse.com.au  Fact Sheet for Healthcare Providers:  IncredibleEmployment.be  This test is no t yet approved or cleared by the [place] FDA and  has been authorized for detection and/or diagnosis of SARS-CoV-2 by FDA under an Emergency Use Authorization (EUA). This EUA will remain  in effect (meaning this test can be used) for the duration of the COVID-19 declaration under Section 564(b)(1) of the Act, 21 U.S.C.section 360bbb-3(b)(1), unless the authorization is terminated  or revoked sooner.       Influenza A by PCR NEGATIVE NEGATIVE Final   Influenza B by PCR NEGATIVE NEGATIVE Final    Comment: (NOTE) The Xpert Xpress SARS-CoV-2/FLU/RSV plus assay is intended as an aid in the diagnosis of influenza from Nasopharyngeal swab specimens and should not be used as a sole basis for treatment. Nasal washings and aspirates are unacceptable for Xpert Xpress SARS-CoV-2/FLU/RSV testing.  Fact Sheet for Patients: EntrepreneurPulse.com.au  Fact Sheet for Healthcare  Providers: IncredibleEmployment.be  This test is not yet approved or cleared by the [place] FDA and has been authorized for detection and/or diagnosis of SARS-CoV-2 by FDA under an Emergency Use Authorization (EUA). This EUA will remain in effect (meaning this test can be used) for the duration of the COVID-19 declaration under Section 564(b)(1) of the Act, 21 U.S.C. section 360bbb-3(b)(1), unless the authorization is terminated or revoked.  Performed at [facility], [place] [address], [place], [place] [address]      Time coordinating discharge: Over 35  minutes  SIGNED:   Little Ishikawa, DO [facility] 06/06/2020, 6:28 PM Pager   If 7PM-7AM, please contact night-coverage [email or web address]

## 2020-06-06 NOTE — Progress Notes (Addendum)
HEMATOLOGY-ONCOLOGY PROGRESS NOTE  SUBJECTIVE: Reports he is tolerating liquids. Denies nausea, pain. Reports black stools and emesis prior to admission.   Oncology History  Gastric adenocarcinoma ([name])  01/17/2020 Initial Diagnosis   Gastric adenocarcinoma ([name])   01/31/2020 -  Chemotherapy   The patient had palonosetron (ALOXI) injection 0.25 mg, 0.25 mg, Intravenous,  Once, 9 of 9 cycles Administration: 0.25 mg (01/31/2020), 0.25 mg (02/15/2020), 0.25 mg (02/28/2020), 0.25 mg (03/14/2020), 0.25 mg (03/28/2020), 0.25 mg (04/10/2020), 0.25 mg (04/24/2020), 0.25 mg (05/08/2020), 0.25 mg (05/22/2020) leucovorin 604 mg in dextrose 5 % 250 mL infusion, 300 mg/m2 = 604 mg (100 % of original dose 300 mg/m2), Intravenous,  Once, 9 of 9 cycles Dose modification: 300 mg/m2 (original dose 300 mg/m2, Cycle 1, Reason: Provider Judgment) Administration: 604 mg (01/31/2020), 604 mg (02/15/2020), 604 mg (02/28/2020), 604 mg (03/14/2020), 604 mg (03/28/2020), 604 mg (04/10/2020), 604 mg (04/24/2020), 604 mg (05/08/2020) oxaliplatin (ELOXATIN) 170 mg in dextrose 5 % 500 mL chemo infusion, 85 mg/m2 = 170 mg, Intravenous,  Once, 9 of 9 cycles Administration: 170 mg (01/31/2020), 170 mg (02/15/2020), 170 mg (02/28/2020), 170 mg (03/14/2020), 170 mg (03/28/2020), 170 mg (04/10/2020), 170 mg (04/24/2020), 170 mg (05/08/2020) fluorouracil (ADRUCIL) chemo injection 600 mg, 300 mg/m2 = 600 mg (100 % of original dose 300 mg/m2), Intravenous,  Once, 8 of 8 cycles Dose modification: 300 mg/m2 (original dose 300 mg/m2, Cycle 1, Reason: Provider Judgment) Administration: 600 mg (01/31/2020), 600 mg (02/15/2020), 600 mg (02/28/2020), 600 mg (03/14/2020), 600 mg (03/28/2020), 600 mg (04/10/2020), 600 mg (04/24/2020) fluorouracil (ADRUCIL) 4,000 mg in sodium chloride 0.9 % 70 mL chemo infusion, 2,000 mg/m2 = 4,000 mg (100 % of original dose 2,000 mg/m2), Intravenous, 1 Day/Dose, 9 of 10 cycles Dose modification: 2,000 mg/m2 (original dose 2,000 mg/m2, Cycle 1,  Reason: Provider Judgment), 1,600 mg/m2 (original dose 2,000 mg/m2, Cycle 9, Reason: Provider Judgment) Administration: 4,000 mg (01/31/2020), 4,000 mg (02/15/2020), 4,000 mg (02/28/2020), 4,000 mg (03/14/2020), 4,000 mg (03/28/2020), 4,000 mg (04/10/2020), 4,000 mg (04/24/2020), 3,200 mg (05/08/2020), 3,200 mg (05/22/2020) trastuzumab-anns (KANJINTI) 504 mg in sodium chloride 0.9 % 250 mL chemo infusion, 6 mg/kg = 504 mg (100 % of original dose 6 mg/kg), Intravenous,  Once, 9 of 10 cycles Dose modification: 6 mg/kg (original dose 6 mg/kg, Cycle 1, Reason: Provider Judgment), 4 mg/kg (original dose 4 mg/kg, Cycle 2, Reason: Provider Judgment), 6 mg/kg (original dose 4 mg/kg, Cycle 10, Reason: Provider Judgment, Comment: Changing to q 3 weeks) Administration: 504 mg (01/31/2020), 300 mg (02/15/2020), 300 mg (02/28/2020), 300 mg (03/14/2020), 300 mg (03/28/2020), 300 mg (04/10/2020), 300 mg (04/24/2020), 300 mg (05/08/2020), 300 mg (05/22/2020)  for chemotherapy treatment.    01/31/2020 -  Chemotherapy   The patient had pembrolizumab (KEYTRUDA) 200 mg in sodium chloride 0.9 % 50 mL chemo infusion, 200 mg, Intravenous, Once, 6 of 7 cycles Administration: 200 mg (01/31/2020), 200 mg (03/14/2020), 200 mg (04/04/2020), 200 mg (05/15/2020), 200 mg (02/22/2020), 200 mg (04/24/2020)  for chemotherapy treatment.      PHYSICAL EXAMINATION:  Vitals:   06/05/20 2143 06/06/20 0627  BP: (!) 162/90 (!) 157/80  Pulse: 98 (!) 108  Resp: 20 20  Temp: 98.3 F (36.8 C) 98.1 F (36.7 C)  SpO2: 100% 99%   Filed Weights   06/02/20 1206 06/02/20 1745  Weight: 155 lb (70.3 kg) 151 lb 3.8 oz (68.6 kg)    Intake/Output from previous day: 11/23 0701 - 11/24 0700 In: 200 [P.O.:200] Out: -   GENERAL:alert, no  distress and comfortable OROPHARYNX:no exudate, no erythema and lips, buccal mucosa, and tongue normal  Vascular: no leg edema ABDOMEN:abdomen soft, non-tender and normal bowel sounds   LABORATORY DATA:  I have reviewed the  data as listed CMP Latest Ref Rng & Units 06/03/2020 06/02/2020 05/22/2020  Glucose 70 - 99 mg/dL 129(H) 170(H) 202(H)  BUN 8 - 23 mg/dL _0 Creatinine 0.61 - 1.24 mg/dL 0.82 0.89 0.85  Sodium 135 - 145 mmol/L 138 131(L) 139  Potassium 3.5 - 5.1 mmol/L 3.9 3.2(L) 3.7  Chloride 98 - 111 mmol/L 100 92(L) 103  CO2 22 - 32 mmol/L _1 Calcium 8.9 - 10.3 mg/dL 8.6(L) 8.4(L) 9.0  Total Protein 6.5 - 8.1 g/dL 5.8(L) 5.7(L) 6.3(L)  Total Bilirubin 0.3 - 1.2 mg/dL 0.5 0.3 0.4  Alkaline Phos 38 - 126 U/L 62 60 85  AST 15 - 41 U/L _2 ALT 0 - 44 U/L _3 Lab Results  Component Value Date   WBC 6.8 06/06/2020   HGB 8.0 (L) 06/06/2020   HCT 25.5 (L) 06/06/2020   MCV 90.7 06/06/2020   PLT 205 06/06/2020   NEUTROABS 5.3 06/06/2020    CT Abdomen Pelvis W Contrast  Result Date: 05/21/2020 CLINICAL DATA:  Restaging of metastatic gastric cancer, ongoing chemotherapy EXAM: CT ABDOMEN AND PELVIS WITH CONTRAST TECHNIQUE: Multidetector CT imaging of the abdomen and pelvis was performed using the standard protocol following bolus administration of intravenous contrast. CONTRAST:  181m OMNIPAQUE IOHEXOL 300 MG/ML  SOLN COMPARISON:  03/27/2020 FINDINGS: Lower chest: Small right pleural effusion is reduced in size from prior. Right central line tip terminates at the cavoatrial junction. Mild coronary atherosclerosis observed along with descending thoracic aortic atherosclerosis. Faint reticulonodular opacities in the posterior basal segment right lower lobe on image 24 of series 5 through image 42 of series 5, probably inflammatory or due to atypical infectious bronchiolitis. Right pericardial lymph node 0.9 cm in short axis on image 19 of series 2, formerly the same. Hepatobiliary: Hepatic cysts noted with somewhat indistinct hepatic hypodense lesions which are less sharply defined than on the prior exam. A lesion in the dome of the right hepatic lobe measures about 2.0 by 1.8 cm on image 17  of series 2, and by my measurements was previously 2.0 by 1.7 cm. Taking into account the slightly reduced marginal definition of these lesions, the overall burden of hepatic metastatic disease appears similar to the prior exam. Pancreas: Unremarkable Spleen: Unremarkable Adrenals/Urinary Tract: Small hypodense bilateral renal lesions are likely cysts although technically too small to characterize. Adrenal glands unremarkable. Urinary bladder unremarkable. Three small punctate left-sided renal calculi are each about 0.2 cm in diameter. Stomach/Bowel: Marked irregular gastric wall thickening in the gastric antrum traversed by a gastric stent. Contrast medium is present along the lumen of the stent, with the contrast column narrowed to about 0.5 cm centrally along the stent. Prominent stool throughout the colon favors constipation. Vascular/Lymphatic: Aortoiliac atherosclerotic vascular disease. Small aortocaval lymph nodes appear similar to prior. Reproductive: Unremarkable Other: Again observed is considerable omental caking of tumor. Right omental caking measures 2.0 cm in thickness on image 47 of series 2, previously 1.7 cm. Scattered tumor nodularity in the omentum observed. Mild perihepatic ascites, likely malignant. Increased nodularity along the left upper paracolic gutter for example on image 36 of series 2. Musculoskeletal: Stable sclerosis in the right iliac bone, probably a benign chondroid lesion. IMPRESSION: 1. Mildly worsened omental  caking of tumor. Mild perihepatic ascites, likely malignant. 2. Marked irregular gastric wall thickening in the gastric antrum traversed by a gastric stent. Contrast medium is present along the lumen of the stent, with the contrast column narrowed to about 0.5 cm centrally along the stent. 3. Other imaging findings of potential clinical significance: Faint reticulonodular opacities in the posterior basal segment right lower lobe, probably inflammatory or due to atypical  infectious bronchiolitis. Small right pleural effusion is reduced in size from prior. Prominent stool throughout the colon favors constipation. Nonobstructive left nephrolithiasis. 4. Aortic atherosclerosis. Aortic Atherosclerosis (ICD10-I70.0). Electronically Signed   By: Van Clines M.D.   On: 05/21/2020 11:00   DG Abdomen Acute W/Chest  Result Date: 06/02/2020 CLINICAL DATA:  Nausea, vomiting. Generalized weakness. History of gastric cancer EXAM: DG ABDOMEN ACUTE WITH 1 VIEW CHEST COMPARISON:  05/21/2020 FINDINGS: Gastric stent is present. There is no evidence of dilated bowel loops or free intraperitoneal air. Moderate volume stool. There is some residual contrast throughout the colon. Sclerotic focus again noted within the right iliac bone. Right-sided Port-A-Cath terminates at the cavoatrial junction. Heart size and mediastinal contours are within normal limits. Both lungs are clear. IMPRESSION: 1. Nonobstructive bowel gas pattern. Moderate volume stool burden. 2. No acute cardiopulmonary process. Electronically Signed   By: Davina Poke D.O.   On: 06/02/2020 13:36   DG C-Arm 1-60 Min  Result Date: 06/04/2020 CLINICAL DATA:  Duodenal stent placement EXAM: DG C-ARM 1-60 MIN CONTRAST:  None FLUOROSCOPY TIME:  Fluoroscopy Time:  1 minutes 18 seconds Radiation Exposure Index (if provided by the fluoroscopic device): Not available Number of Acquired Spot Images: 6 COMPARISON:  05/21/2020 FINDINGS: Multiple spot films were obtained during endoscopy with placement of a stent within a previously placed duodenal stent. IMPRESSION: Duodenal stent placement within a previously placed stent. Electronically Signed   By: Inez Catalina M.D.   On: 06/04/2020 14:57    ASSESSMENT AND PLAN: 1. 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  CT abdomen[facility]pelvis at [facility] 01/03/2020-masslike wall thickening of the gastric antrum, numerous liver metastases, small volume ascites, numerous  peritoneal nodules, enlarged periaortic and porta hepatis lymph nodes  Upper endoscopy 01/06/2020-large ulcer in the gastric antrum with heaped up and firm edges, Duodenal bulb largely "obliterated" by the mass, biopsy confirmed adenocarcinoma, HER-2 3+, intact mismatch repair protein expression, PD-L1: 4   CT chest 01/10/2020-acute bilateral pulmonary emboli, no lymphadenopathy or suspicious lung nodules  Baseline echo 01/17/20 at [place], EF 60-65%  CT abdomen/pelvis 01/22/2020, gastric mass, liver metastases, peritoneal/omental nodules, ascites, right pleural effusion  Therapeutic and diagnostic paracentesis on 01/27/2020, 2.1 L removed;cytology shows malignant cells,malignant ascites  PD-L1combined positive score: 1 (from ascites/periteoneal fluid), reported 02/01/20   Cycle 1 FOLFOX with trastuzumab and pembrolizumab, 01/31/2020  Baseline CEA 13.6 (02/08/2020)  Cycle 2 FOLFOX/trastuzumab 02/15/20  Pembrolizumab 02/22/2020  Cycle 3 FOLFOX/trastuzumab 02/28/2020  Cycle 4 FOLFOX/trastuzumab 03/14/2020  Pembrolizumab 03/14/2020  CTs 03/27/2020-multifocal liver metastases again noted, appear mildly decreased in size in the interval. Persistent diffuse peritoneal disease with mild decrease in size of index peritoneal lesions. Similar volume of upper abdominal ascites. Small right pleural effusion is decreased.  Cycle 5 FOLFOX/trastuzumab 03/28/2020  Pembrolizumab 9/22/202  Cycle 6 FOLFOX/trastuzumab 04/10/2020  Pembrolizumab 04/24/2020  Cycle 7 FOLFOX/trastuzumab 04/24/2020  Cycle 8 FOLFOX/trastuzumab 05/08/2020  Pembrolizumab 05/15/2020  CTs 05/21/2020-mildly worsened omental caking of tumor.  Mild perihepatic ascites.  Marked irregular gastric wall thickening in the gastric antrum traversed by gastric stent.  Cycle 9  5-FU/trastuzumab 05/22/2020  Upper endoscopy 06/04/2020-moderately severe esophagitis; large, fungating polypoid and ulcerated circumferential mass in the gastric  antrum, no bleeding/recent bleeding; obvious stenosis of stent with significant tumor ingrowth; stent placed  2.Nausea/vomiting secondary to #1 with gastric outlet obstruction  Gastric stent placement 01/12/2020, n/v resolved  Admission with intractable nausea/vomiting 06/02/2020-upper endoscopy 06/04/2000-tumor ingrowth in gastric antrum stent, new stent placed, gastric antrum mass  3.Acute bilateral lower lobe pulmonary emboli on CT 01/10/2020-treated with apixaban,(givenLovenoxduring hospitalization)  4.Anemia-likely secondary to bleeding from the gastric mass  5.Status post Port-A-Cath placement 01/13/2020  6.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  Hospital admission 06/02/2020-upper GI bleed and anemia   He is tolerating liquids. Hemoglobin is stable. We will see him in follow-up as scheduled next week.    Recommendations: 1.  Advance diet as tolerated per GI. 2.  Continue Thorazine and baclofen for hiccups. 3.  Unclear if tumor ingrowth into stent indicates further disease progression but will consider possible treatment change as an outpatient in the future. Plan to discuss further at outpatient appt at [facility] on 06/12/2020. 4.  Continue to hold anticoagulation therapy 5.  Please call oncology as needed    LOS: 4 days   Ned Card, AGPCNP-BC 06/06/20  Drew [name] was interviewed and examined.  He  is tolerating a liquid diet.  I recommend holding anticoagulation given the severe anemia on hospital admission and his report of hematemesis and black stools.  I discussed the risk/benefit of anticoagulation therapy with Drew [name].  He is in agreement.  He will follow-up at the Cancer center 06/12/2020.  We will discuss the indication for continuing systemic therapy versus hospice care with Drew [name] and his daughter.

## 2020-06-12 ENCOUNTER — Inpatient Hospital Stay: Payer: Medicare PPO

## 2020-06-12 ENCOUNTER — Other Ambulatory Visit: Payer: Self-pay | Admitting: *Deleted

## 2020-06-12 ENCOUNTER — Inpatient Hospital Stay (HOSPITAL_BASED_OUTPATIENT_CLINIC_OR_DEPARTMENT_OTHER): Payer: Medicare PPO | Admitting: Oncology

## 2020-06-12 ENCOUNTER — Other Ambulatory Visit: Payer: Self-pay

## 2020-06-12 VITALS — BP 108/61 | HR 77 | Temp 97.6°F | Resp 16 | Ht 66.5 in | Wt 149.8 lb

## 2020-06-12 DIAGNOSIS — R180 Malignant ascites: Secondary | ICD-10-CM | POA: Diagnosis not present

## 2020-06-12 DIAGNOSIS — C163 Malignant neoplasm of pyloric antrum: Secondary | ICD-10-CM | POA: Diagnosis not present

## 2020-06-12 DIAGNOSIS — C169 Malignant neoplasm of stomach, unspecified: Secondary | ICD-10-CM

## 2020-06-12 DIAGNOSIS — Z5111 Encounter for antineoplastic chemotherapy: Secondary | ICD-10-CM | POA: Diagnosis not present

## 2020-06-12 DIAGNOSIS — C787 Secondary malignant neoplasm of liver and intrahepatic bile duct: Secondary | ICD-10-CM | POA: Diagnosis not present

## 2020-06-12 DIAGNOSIS — D649 Anemia, unspecified: Secondary | ICD-10-CM

## 2020-06-12 DIAGNOSIS — R351 Nocturia: Secondary | ICD-10-CM | POA: Diagnosis not present

## 2020-06-12 DIAGNOSIS — Z5112 Encounter for antineoplastic immunotherapy: Secondary | ICD-10-CM | POA: Diagnosis not present

## 2020-06-12 DIAGNOSIS — T451X5A Adverse effect of antineoplastic and immunosuppressive drugs, initial encounter: Secondary | ICD-10-CM | POA: Diagnosis not present

## 2020-06-12 DIAGNOSIS — Z86711 Personal history of pulmonary embolism: Secondary | ICD-10-CM | POA: Diagnosis not present

## 2020-06-12 DIAGNOSIS — Z79899 Other long term (current) drug therapy: Secondary | ICD-10-CM | POA: Diagnosis not present

## 2020-06-12 DIAGNOSIS — G620 Drug-induced polyneuropathy: Secondary | ICD-10-CM | POA: Diagnosis not present

## 2020-06-12 LAB — CMP (CANCER CENTER ONLY)
ALT: 12 U/L (ref 0–44)
AST: 22 U/L (ref 15–41)
Albumin: 2.4 g/dL — ABNORMAL LOW (ref 3.5–5.0)
Alkaline Phosphatase: 77 U/L (ref 38–126)
Anion gap: 9 (ref 5–15)
BUN: 9 mg/dL (ref 8–23)
CO2: 25 mmol/L (ref 22–32)
Calcium: 8.8 mg/dL — ABNORMAL LOW (ref 8.9–10.3)
Chloride: 103 mmol/L (ref 98–111)
Creatinine: 0.76 mg/dL (ref 0.61–1.24)
GFR, Estimated: >60 mL/min (ref >60)
Glucose, Bld: 196 mg/dL — ABNORMAL HIGH (ref 70–99)
Potassium: 3.7 mmol/L (ref 3.5–5.1)
Sodium: 137 mmol/L (ref 135–145)
Total Bilirubin: 0.3 mg/dL (ref 0.3–1.2)
Total Protein: 5.9 g/dL — ABNORMAL LOW (ref 6.5–8.1)

## 2020-06-12 LAB — CBC WITH DIFFERENTIAL (CANCER CENTER ONLY)
Abs Immature Granulocytes: 0.02 10*3/uL (ref 0.00–0.07)
Basophils Absolute: 0 10*3/uL (ref 0.0–0.1)
Basophils Relative: 1 %
Eosinophils Absolute: 0.1 10*3/uL (ref 0.0–0.5)
Eosinophils Relative: 1 %
HCT: 23.7 % — ABNORMAL LOW (ref 39.0–52.0)
Hemoglobin: 7.3 g/dL — ABNORMAL LOW (ref 13.0–17.0)
Immature Granulocytes: 0 %
Lymphocytes Relative: 14 %
Lymphs Abs: 0.9 10*3/uL (ref 0.7–4.0)
MCH: 27.9 pg (ref 26.0–34.0)
MCHC: 30.8 g/dL (ref 30.0–36.0)
MCV: 90.5 fL (ref 80.0–100.0)
Monocytes Absolute: 0.4 10*3/uL (ref 0.1–1.0)
Monocytes Relative: 7 %
Neutro Abs: 4.8 10*3/uL (ref 1.7–7.7)
Neutrophils Relative %: 77 %
Platelet Count: 269 10*3/uL (ref 150–400)
RBC: 2.62 MIL/uL — ABNORMAL LOW (ref 4.22–5.81)
RDW: 15.6 % — ABNORMAL HIGH (ref 11.5–15.5)
WBC Count: 6.2 10*3/uL (ref 4.0–10.5)
nRBC: 0 % (ref 0.0–0.2)

## 2020-06-12 LAB — SAMPLE TO BLOOD BANK

## 2020-06-12 LAB — TSH: TSH: 1.186 u[IU]/mL (ref 0.320–4.118)

## 2020-06-12 LAB — MAGNESIUM: Magnesium: 1.8 mg/dL (ref 1.7–2.4)

## 2020-06-12 LAB — PREPARE RBC (CROSSMATCH)

## 2020-06-12 MED ORDER — HEPARIN SOD (PORK) LOCK FLUSH 100 UNIT/ML IV SOLN
500.0000 [IU] | Freq: Every day | INTRAVENOUS | Status: AC | PRN
  Administered 2020-06-12: 500 [IU]
  Filled 2020-06-12: qty 5

## 2020-06-12 MED ORDER — SODIUM CHLORIDE 0.9% FLUSH
10.0000 mL | INTRAVENOUS | Status: AC | PRN
  Administered 2020-06-12: 10 mL
  Filled 2020-06-12: qty 10

## 2020-06-12 MED ORDER — SODIUM CHLORIDE 0.9% IV SOLUTION
250.0000 mL | Freq: Once | INTRAVENOUS | Status: AC
  Administered 2020-06-12: 250 mL via INTRAVENOUS
  Filled 2020-06-12: qty 250

## 2020-06-12 NOTE — Patient Instructions (Signed)

## 2020-06-12 NOTE — Progress Notes (Signed)
DISCONTINUE ON PATHWAY REGIMEN - Gastroesophageal     Pembrolizumab: A cycle is every 21 days:     Pembrolizumab    Chemotherapy/trastuzumab cycle 1: A cycle is 14 days:     Trastuzumab-xxxx      Oxaliplatin      Leucovorin      Fluorouracil      Fluorouracil    Chemotherapy/trastuzumab cycles 2 and beyond: A cycle is every 14 days:     Trastuzumab-xxxx      Oxaliplatin      Leucovorin      Fluorouracil      Fluorouracil   **Always confirm dose/schedule in your pharmacy ordering system**  REASON: Disease Progression PRIOR TREATMENT: [id number]: Pembrolizumab 200 mg q21 Days + Trastuzumab 6/4 mg/kg + mFOLFOX6 q14 Days Until Progression, Unacceptable Toxicity, or up to 24 Months TREATMENT RESPONSE: Partial Response (PR)  START ON PATHWAY REGIMEN - Gastroesophageal     A cycle is every 28 days:     Ramucirumab      Paclitaxel   **Always confirm dose/schedule in your pharmacy ordering system**  Patient Characteristics: Distant Metastases (cM1/pM1) / Locally Recurrent Disease, Adenocarcinoma - Esophageal, GE Junction, and Gastric, Second Line, MSS/pMMR or MSI Unknown Histology: Adenocarcinoma Disease Classification: Gastric Therapeutic Status: Distant Metastases (No Additional Staging) Line of Therapy: Second Line Microsatellite/Mismatch Repair Status: MSS/pMMR Intent of Therapy: Non-Curative / Palliative Intent, Discussed with Patient

## 2020-06-12 NOTE — Progress Notes (Signed)
Pt discharged in stable condition.

## 2020-06-12 NOTE — Progress Notes (Signed)
[facility] OFFICE PROGRESS NOTE   Diagnosis: Gastric cancer  INTERVAL HISTORY:   Drew [name] returns as scheduled.  He was discharged from the hospital 06/06/2020 after admission with intractable nausea and vomiting.  He underwent an upper endoscopy that confirmed tumor ingrowth at the gastric stent.  A new stent was placed.  He is tolerating a diet.  He denies recurrent nausea and vomiting.  No bleeding.  He feels "weak ".  No pain.  Objective:  Vital signs in last 24 hours:  Blood pressure 108/61, pulse 77, temperature 97.6 F (36.4 C), temperature source Tympanic, resp. rate 16, height 5' 6.5" (1.689 m), weight 149 lb 12.8 oz (67.9 kg), SpO2 100 %.    HEENT: No thrush or ulcers Resp: Lungs clear bilaterally Cardio: Regular rate and rhythm GI: No hepatosplenomegaly, no mass, nontender Vascular: No leg edema    Portacath/PICC-without erythema  Lab Results:  Lab Results  Component Value Date   WBC 6.2 06/12/2020   HGB 7.3 (L) 06/12/2020   HCT 23.7 (L) 06/12/2020   MCV 90.5 06/12/2020   PLT 269 06/12/2020   NEUTROABS 4.8 06/12/2020    CMP  Lab Results  Component Value Date   NA 137 06/12/2020   K 3.7 06/12/2020   CL 103 06/12/2020   CO2 25 06/12/2020   GLUCOSE 196 (H) 06/12/2020   BUN 9 06/12/2020   CREATININE 0.76 06/12/2020   CALCIUM 8.8 (L) 06/12/2020   PROT 5.9 (L) 06/12/2020   ALBUMIN 2.4 (L) 06/12/2020   AST 22 06/12/2020   ALT 12 06/12/2020   ALKPHOS 77 06/12/2020   BILITOT 0.3 06/12/2020   GFRNONAA >60 06/12/2020   GFRAA >60 04/10/2020    Lab Results  Component Value Date   CEA1 14.92 (H) 04/24/2020     Medications: I have reviewed the patient's current medications.   Assessment/Plan: 1. 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  CT abdomen[facility]pelvis [facility] 01/03/2020-masslike wall thickening of the gastric antrum, numerous liver metastases, small volume ascites, numerous peritoneal nodules,  enlarged periaortic and porta hepatis lymph nodes  Upper endoscopy 01/06/2020-large ulcer in the gastric antrum with heaped up and firm edges, Duodenal bulb largely "obliterated"by the mass, biopsy confirmed adenocarcinoma, HER-2 3+, intact mismatch repair protein expression, PD-L1: 4   CT chest 01/10/2020-acute bilateral pulmonary emboli, no lymphadenopathy or suspicious lung nodules  Baseline echo 01/17/20 at [place], EF 60-65%  CT abdomen/pelvis 01/22/2020, gastric mass, liver metastases, peritoneal/omental nodules, ascites, right pleural effusion  Therapeutic and diagnostic paracentesis on 01/27/2020, 2.1 L removed;cytology shows malignant cells,malignant ascites  PD-L1combined positive score: 1 (from ascites/periteoneal fluid), reported 02/01/20   Cycle 1 FOLFOX with trastuzumab and pembrolizumab, 01/31/2020  Baseline CEA 13.6 (02/08/2020)  Cycle 2 FOLFOX/trastuzumab 02/15/20  Pembrolizumab 02/22/2020  Cycle 3 FOLFOX/trastuzumab 02/28/2020  Cycle 4 FOLFOX/trastuzumab 03/14/2020  Pembrolizumab 03/14/2020  CTs 03/27/2020-multifocal liver metastases again noted, appear mildly decreased in size in the interval. Persistent diffuse peritoneal disease with mild decrease in size of index peritoneal lesions. Similar volume of upper abdominal ascites. Small right pleural effusion is decreased.  Cycle 5 FOLFOX/trastuzumab 03/28/2020  Pembrolizumab 9/22/202  Cycle 6 FOLFOX/trastuzumab 04/10/2020  Pembrolizumab 04/24/2020  Cycle 7 FOLFOX/trastuzumab 04/24/2020  Cycle 8 FOLFOX/trastuzumab 05/08/2020  Pembrolizumab 05/15/2020  CTs 05/21/2020-mildly worsened omental caking of tumor. Mild perihepatic ascites. Marked irregular gastric wall thickening in the gastric antrum traversed by gastric stent.  Cycle95-FU/trastuzumab 05/22/2020  Upper endoscopy 06/04/2020-moderately severe esophagitis; large, fungating polypoid and ulcerated circumferential mass in the gastric  antrum, no  bleeding/recent bleeding; obvious stenosis of stent with significant tumor ingrowth; stent placed  2.Nausea/vomiting secondary to #1 with gastric outlet obstruction  Gastric stent placement 01/12/2020, n/v resolved  Admission with intractable nausea/vomiting 06/02/2020-upper endoscopy 06/04/2000-tumor ingrowth in gastric antrum stent, new stent placed, gastric antrum mass  3.Acute bilateral lower lobe pulmonary emboli on CT 01/10/2020-treated with apixaban,(givenLovenoxduring hospitalization), apixaban discontinued during hospital admission November 2021  4.Anemia-likely secondary to bleeding from the gastric mass, chemotherapy, and chronic disease  5.Status post Port-A-Cath placement 01/13/2020  6.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  Hospital admission 06/02/2020-intractable nausea/vomiting secondary to tumor ingrowth at the gastric pylorus stent    Disposition: Drew [name] has metastatic gastric cancer.  He was admitted last week with gastric outlet obstruction secondary to tumor ingrowth at the gastric stent.  He is tolerating a diet after placement of a second stent.  I discussed treatment options at length with Drew [name] and his daughter.  There is clinical and endoscopic evidence of disease progression.  We decided to discontinue the FOLFOX/trastuzumab and pembrolizumab.  We discussed comfort care versus a trial  of salvage systemic therapy.  Drew [name] would like to continue treatment.  I recommend treatment with Taxol/ramucirumab.  We reviewed potential toxicities associated with Taxol including the chance of allergic reaction, neuropathy, bone pain, and hematologic toxicity.  We discussed the potential for an allergic reaction, hypertension, thromboembolic disease, bleeding, and bowel perforation associated with ramucirumab.  He agrees to proceed.  Drew [name] will be scheduled for cycle 1 Taxol/ramucirumab on 06/18/2020.  He will return for cycle 2 on 07/03/2020.  He is currently maintained off of anticoagulation therapy due to severe anemia and clinical evidence of bleeding from the gastric mass.  We discussed the risk/benefit of resuming anticoagulation therapy.  I recommend discontinuing anticoagulation.  He understands the risk of recurrent venous thromboembolic disease.  We decided against placement of an IVC filter.  He was found to have incidental pulmonary emboli on a CT in June.  Mr. Heisler has symptomatic anemia.  He will receive a red cell transfusion today. Drew Coder, MD  06/12/2020  4:54 PM

## 2020-06-12 NOTE — Patient Instructions (Signed)

## 2020-06-13 ENCOUNTER — Telehealth: Payer: Self-pay | Admitting: Nurse Practitioner

## 2020-06-13 LAB — TYPE AND SCREEN
ABO/RH(D): O POS
Antibody Screen: NEGATIVE
Unit division: 0

## 2020-06-13 LAB — BPAM RBC
Blood Product Expiration Date: 202112282359
ISSUE DATE / TIME: 202111301110
Unit Type and Rh: 5100

## 2020-06-13 NOTE — Telephone Encounter (Signed)
Scheduled appointments per 11/29 los. Spoke to patient's daughter who is aware of appointments date and times.

## 2020-06-14 ENCOUNTER — Inpatient Hospital Stay: Payer: Medicare PPO

## 2020-06-15 NOTE — Progress Notes (Signed)
Pharmacist Chemotherapy Monitoring - Initial Assessment    Anticipated start date: 06/18/20  Regimen:  . Are orders appropriate based on the patient's diagnosis, regimen, and cycle? Yes . Does the plan date match the patient's scheduled date? Yes . Is the sequencing of drugs appropriate? Yes . Are the premedications appropriate for the patient's regimen? Yes . Prior Authorization for treatment is: Approved o If applicable, is the correct biosimilar selected based on the patient's insurance? not applicable  Organ Function and Labs: Marland Kitchen Are dose adjustments needed based on the patient's renal function, hepatic function, or hematologic function? Yes . Are appropriate labs ordered prior to the start of patient's treatment? Yes . Other organ system assessment, if indicated: ramucirumab: baseline BP . The following baseline labs, if indicated, have been ordered: ramucirumab: urine protein  Dose Assessment: . Are the drug doses appropriate? Yes . Are the following correct: o Drug concentrations Yes o IV fluid compatible with drug Yes o Administration routes Yes o Timing of therapy Yes . If applicable, does the patient have documented access for treatment and/or plans for port-a-cath placement? yes . If applicable, have lifetime cumulative doses been properly documented and assessed? yes Lifetime Dose Tracking  . Oxaliplatin: 719.156 mg/m2 (1,360 mg) = 119.86 % of the maximum lifetime dose of 600 mg/m2  o   Toxicity Monitoring/Prevention: . The patient has the following take home antiemetics prescribed: Prochlorperazine . The patient has the following take home medications prescribed: N/A . Medication allergies and previous infusion related reactions, if applicable, have been reviewed and addressed. Yes . The patient's current medication list has been assessed for drug-drug interactions with their chemotherapy regimen. no significant drug-drug interactions were identified on review.  Order  Review: . Are the treatment plan orders signed? Yes . Is the patient scheduled to see a provider prior to their treatment? No  I verify that I have reviewed each item in the above checklist and answered each question accordingly.  Philomena Course 06/15/2020 3:02 PM

## 2020-06-16 ENCOUNTER — Encounter: Payer: Self-pay | Admitting: Oncology

## 2020-06-16 ENCOUNTER — Encounter: Payer: Self-pay | Admitting: Nurse Practitioner

## 2020-06-17 ENCOUNTER — Other Ambulatory Visit: Payer: Self-pay | Admitting: Oncology

## 2020-06-18 ENCOUNTER — Inpatient Hospital Stay: Payer: Medicare PPO | Attending: Oncology

## 2020-06-18 ENCOUNTER — Inpatient Hospital Stay: Payer: Medicare PPO

## 2020-06-18 ENCOUNTER — Other Ambulatory Visit: Payer: Self-pay

## 2020-06-18 ENCOUNTER — Ambulatory Visit (HOSPITAL_BASED_OUTPATIENT_CLINIC_OR_DEPARTMENT_OTHER): Payer: Medicare PPO | Admitting: Medical

## 2020-06-18 VITALS — BP 132/63 | HR 102 | Temp 98.6°F | Resp 16 | Wt 149.5 lb

## 2020-06-18 DIAGNOSIS — Z8673 Personal history of transient ischemic attack (TIA), and cerebral infarction without residual deficits: Secondary | ICD-10-CM | POA: Insufficient documentation

## 2020-06-18 DIAGNOSIS — C163 Malignant neoplasm of pyloric antrum: Secondary | ICD-10-CM | POA: Diagnosis not present

## 2020-06-18 DIAGNOSIS — R197 Diarrhea, unspecified: Secondary | ICD-10-CM | POA: Diagnosis not present

## 2020-06-18 DIAGNOSIS — G620 Drug-induced polyneuropathy: Secondary | ICD-10-CM | POA: Diagnosis not present

## 2020-06-18 DIAGNOSIS — R351 Nocturia: Secondary | ICD-10-CM | POA: Insufficient documentation

## 2020-06-18 DIAGNOSIS — T8090XA Unspecified complication following infusion and therapeutic injection, initial encounter: Secondary | ICD-10-CM | POA: Diagnosis not present

## 2020-06-18 DIAGNOSIS — R109 Unspecified abdominal pain: Secondary | ICD-10-CM | POA: Diagnosis not present

## 2020-06-18 DIAGNOSIS — Z86711 Personal history of pulmonary embolism: Secondary | ICD-10-CM | POA: Insufficient documentation

## 2020-06-18 DIAGNOSIS — T451X5A Adverse effect of antineoplastic and immunosuppressive drugs, initial encounter: Secondary | ICD-10-CM | POA: Insufficient documentation

## 2020-06-18 DIAGNOSIS — Z5111 Encounter for antineoplastic chemotherapy: Secondary | ICD-10-CM | POA: Diagnosis not present

## 2020-06-18 DIAGNOSIS — R5383 Other fatigue: Secondary | ICD-10-CM | POA: Diagnosis not present

## 2020-06-18 DIAGNOSIS — C169 Malignant neoplasm of stomach, unspecified: Secondary | ICD-10-CM

## 2020-06-18 DIAGNOSIS — Z5112 Encounter for antineoplastic immunotherapy: Secondary | ICD-10-CM | POA: Insufficient documentation

## 2020-06-18 DIAGNOSIS — D649 Anemia, unspecified: Secondary | ICD-10-CM | POA: Diagnosis not present

## 2020-06-18 DIAGNOSIS — Z95828 Presence of other vascular implants and grafts: Secondary | ICD-10-CM

## 2020-06-18 LAB — CBC WITH DIFFERENTIAL (CANCER CENTER ONLY)
Abs Immature Granulocytes: 0.04 10*3/uL (ref 0.00–0.07)
Basophils Absolute: 0 10*3/uL (ref 0.0–0.1)
Basophils Relative: 0 %
Eosinophils Absolute: 0 10*3/uL (ref 0.0–0.5)
Eosinophils Relative: 0 %
HCT: 23.6 % — ABNORMAL LOW (ref 39.0–52.0)
Hemoglobin: 7.5 g/dL — ABNORMAL LOW (ref 13.0–17.0)
Immature Granulocytes: 1 %
Lymphocytes Relative: 11 %
Lymphs Abs: 0.9 10*3/uL (ref 0.7–4.0)
MCH: 28 pg (ref 26.0–34.0)
MCHC: 31.8 g/dL (ref 30.0–36.0)
MCV: 88.1 fL (ref 80.0–100.0)
Monocytes Absolute: 0.7 10*3/uL (ref 0.1–1.0)
Monocytes Relative: 8 %
Neutro Abs: 6.6 10*3/uL (ref 1.7–7.7)
Neutrophils Relative %: 80 %
Platelet Count: 273 10*3/uL (ref 150–400)
RBC: 2.68 MIL/uL — ABNORMAL LOW (ref 4.22–5.81)
RDW: 15.1 % (ref 11.5–15.5)
WBC Count: 8.2 10*3/uL (ref 4.0–10.5)
nRBC: 0 % (ref 0.0–0.2)

## 2020-06-18 LAB — CMP (CANCER CENTER ONLY)
ALT: 10 U/L (ref 0–44)
AST: 19 U/L (ref 15–41)
Albumin: 2.6 g/dL — ABNORMAL LOW (ref 3.5–5.0)
Alkaline Phosphatase: 84 U/L (ref 38–126)
Anion gap: 7 (ref 5–15)
BUN: 10 mg/dL (ref 8–23)
CO2: 28 mmol/L (ref 22–32)
Calcium: 9.2 mg/dL (ref 8.9–10.3)
Chloride: 101 mmol/L (ref 98–111)
Creatinine: 0.77 mg/dL (ref 0.61–1.24)
GFR, Estimated: >60 mL/min (ref >60)
Glucose, Bld: 142 mg/dL — ABNORMAL HIGH (ref 70–99)
Potassium: 4.1 mmol/L (ref 3.5–5.1)
Sodium: 136 mmol/L (ref 135–145)
Total Bilirubin: 0.6 mg/dL (ref 0.3–1.2)
Total Protein: 6.3 g/dL — ABNORMAL LOW (ref 6.5–8.1)

## 2020-06-18 MED ORDER — DIPHENHYDRAMINE HCL 50 MG/ML IJ SOLN
INTRAMUSCULAR | Status: AC
  Filled 2020-06-18: qty 1

## 2020-06-18 MED ORDER — FAMOTIDINE IN NACL 20-0.9 MG/50ML-% IV SOLN
INTRAVENOUS | Status: AC
  Filled 2020-06-18: qty 50

## 2020-06-18 MED ORDER — DIPHENHYDRAMINE HCL 50 MG/ML IJ SOLN
25.0000 mg | Freq: Once | INTRAMUSCULAR | Status: AC
  Administered 2020-06-18: 25 mg via INTRAVENOUS

## 2020-06-18 MED ORDER — HEPARIN SOD (PORK) LOCK FLUSH 100 UNIT/ML IV SOLN
500.0000 [IU] | Freq: Once | INTRAVENOUS | Status: AC | PRN
  Administered 2020-06-18: 500 [IU]
  Filled 2020-06-18: qty 5

## 2020-06-18 MED ORDER — FAMOTIDINE IN NACL 20-0.9 MG/50ML-% IV SOLN
20.0000 mg | Freq: Once | INTRAVENOUS | Status: AC | PRN
  Administered 2020-06-18: 20 mg via INTRAVENOUS

## 2020-06-18 MED ORDER — METHYLPREDNISOLONE SODIUM SUCC 125 MG IJ SOLR: 125.0000 mg | Freq: Once | INTRAMUSCULAR | Status: AC | PRN

## 2020-06-18 MED ORDER — SODIUM CHLORIDE 0.9 % IV SOLN
8.0000 mg/kg | Freq: Once | INTRAVENOUS | Status: AC
  Administered 2020-06-18: 500 mg via INTRAVENOUS
  Filled 2020-06-18: qty 50

## 2020-06-18 MED ORDER — SODIUM CHLORIDE 0.9% FLUSH
10.0000 mL | INTRAVENOUS | Status: DC | PRN
  Administered 2020-06-18: 10 mL
  Filled 2020-06-18: qty 10

## 2020-06-18 MED ORDER — SODIUM CHLORIDE 0.9% FLUSH
10.0000 mL | Freq: Once | INTRAVENOUS | Status: AC
  Administered 2020-06-18: 10 mL
  Filled 2020-06-18: qty 10

## 2020-06-18 MED ORDER — DIPHENHYDRAMINE HCL 50 MG/ML IJ SOLN: 25.0000 mg | Freq: Once | INTRAMUSCULAR | Status: AC | PRN

## 2020-06-18 MED ORDER — METHYLPREDNISOLONE SODIUM SUCC 125 MG IJ SOLR
60.0000 mg | Freq: Once | INTRAMUSCULAR | Status: AC
  Administered 2020-06-18: 60 mg via INTRAVENOUS

## 2020-06-18 MED ORDER — SODIUM CHLORIDE 0.9 % IV SOLN
20.0000 mg | Freq: Once | INTRAVENOUS | Status: AC
  Administered 2020-06-18: 20 mg via INTRAVENOUS
  Filled 2020-06-18: qty 20

## 2020-06-18 MED ORDER — FAMOTIDINE IN NACL 20-0.9 MG/50ML-% IV SOLN
20.0000 mg | Freq: Once | INTRAVENOUS | Status: AC
  Administered 2020-06-18: 20 mg via INTRAVENOUS

## 2020-06-18 MED ORDER — SODIUM CHLORIDE 0.9 % IV SOLN
Freq: Once | INTRAVENOUS | Status: AC
  Filled 2020-06-18: qty 250

## 2020-06-18 MED ORDER — ALTEPLASE 2 MG IJ SOLR
2.0000 mg | Freq: Once | INTRAMUSCULAR | Status: DC | PRN
  Filled 2020-06-18: qty 2

## 2020-06-18 MED ORDER — SODIUM CHLORIDE 0.9 % IV SOLN
80.0000 mg/m2 | Freq: Once | INTRAVENOUS | Status: AC
  Administered 2020-06-18: 144 mg via INTRAVENOUS
  Filled 2020-06-18: qty 24

## 2020-06-18 NOTE — Patient Instructions (Addendum)
[facility] Discharge Instructions for Patients Receiving Chemotherapy  Per Dr. Benay Spice, do not restart the Eliquis due to your increased risk of bleeding.   Today you received the following monoclonal antibody and chemotherapy agents Taxol and Cyramza  To help prevent nausea and vomiting after your treatment, we encourage you to take your nausea medication as prescribed.   If you develop nausea and vomiting that is not controlled by your nausea medication, call the clinic.   BELOW ARE SYMPTOMS THAT SHOULD BE REPORTED IMMEDIATELY:  *FEVER GREATER THAN 100.5 F  *CHILLS WITH OR WITHOUT FEVER  NAUSEA AND VOMITING THAT IS NOT CONTROLLED WITH YOUR NAUSEA MEDICATION  *UNUSUAL SHORTNESS OF BREATH  *UNUSUAL BRUISING OR BLEEDING  TENDERNESS IN MOUTH AND THROAT WITH OR WITHOUT PRESENCE OF ULCERS  *URINARY PROBLEMS  *BOWEL PROBLEMS  UNUSUAL RASH Items with * indicate a potential emergency and should be followed up as soon as possible.  Feel free to call the clinic should you have any questions or concerns. The clinic phone number is [phone].  Please show the [place] [facility] at check-in to the Emergency Department and triage nurse. Paclitaxel injection What is this medicine? PACLITAXEL (PAK li TAX el) is a chemotherapy drug. It targets fast dividing cells, like cancer cells, and causes these cells to die. This medicine is used to treat ovarian cancer, breast cancer, lung cancer, Kaposi's sarcoma, and other cancers. This medicine may be used for other purposes; ask your health care provider or pharmacist if you have questions. COMMON BRAND NAME(S): Onxol, Taxol What should I tell my health care provider before I take this medicine? They need to know if you have any of these conditions:  history of irregular heartbeat  liver disease  low blood counts, like low white cell, platelet, or red cell counts  lung or breathing disease, like asthma  tingling of the  fingers or toes, or other nerve disorder  an unusual or allergic reaction to paclitaxel, alcohol, polyoxyethylated castor oil, other chemotherapy, other medicines, foods, dyes, or preservatives  pregnant or trying to get pregnant  breast-feeding How should I use this medicine? This drug is given as an infusion into a vein. It is administered in a hospital or clinic by a specially trained health care professional. Talk to your pediatrician regarding the use of this medicine in children. Special care may be needed. Overdosage: If you think you have taken too much of this medicine contact a poison control center or emergency room at once. NOTE: This medicine is only for you. Do not share this medicine with others. What if I miss a dose? It is important not to miss your dose. Call your doctor or health care professional if you are unable to keep an appointment. What may interact with this medicine? Do not take this medicine with any of the following medications:  disulfiram  metronidazole This medicine may also interact with the following medications:  antiviral medicines for hepatitis, HIV or AIDS  certain antibiotics like erythromycin and clarithromycin  certain medicines for fungal infections like ketoconazole and itraconazole  certain medicines for seizures like carbamazepine, phenobarbital, phenytoin  gemfibrozil  nefazodone  rifampin  St. John's wort This list may not describe all possible interactions. Give your health care provider a list of all the medicines, herbs, non-prescription drugs, or dietary supplements you use. Also tell them if you smoke, drink alcohol, or use illegal drugs. Some items may interact with your medicine. What should I watch for while  using this medicine? Your condition will be monitored carefully while you are receiving this medicine. You will need important blood work done while you are taking this medicine. This medicine can cause serious  allergic reactions. To reduce your risk you will need to take other medicine(s) before treatment with this medicine. If you experience allergic reactions like skin rash, itching or hives, swelling of the face, lips, or tongue, tell your doctor or health care professional right away. In some cases, you may be given additional medicines to help with side effects. Follow all directions for their use. This drug may make you feel generally unwell. This is not uncommon, as chemotherapy can affect healthy cells as well as cancer cells. Report any side effects. Continue your course of treatment even though you feel ill unless your doctor tells you to stop. Call your doctor or health care professional for advice if you get a fever, chills or sore throat, or other symptoms of a cold or flu. Do not treat yourself. This drug decreases your body's ability to fight infections. Try to avoid being around people who are sick. This medicine may increase your risk to bruise or bleed. Call your doctor or health care professional if you notice any unusual bleeding. Be careful brushing and flossing your teeth or using a toothpick because you may get an infection or bleed more easily. If you have any dental work done, tell your dentist you are receiving this medicine. Avoid taking products that contain aspirin, acetaminophen, ibuprofen, naproxen, or ketoprofen unless instructed by your doctor. These medicines may hide a fever. Do not become pregnant while taking this medicine. Women should inform their doctor if they wish to become pregnant or think they might be pregnant. There is a potential for serious side effects to an unborn child. Talk to your health care professional or pharmacist for more information. Do not breast-feed an infant while taking this medicine. Men are advised not to father a child while receiving this medicine. This product may contain alcohol. Ask your pharmacist or healthcare provider if this medicine  contains alcohol. Be sure to tell all healthcare providers you are taking this medicine. Certain medicines, like metronidazole and disulfiram, can cause an unpleasant reaction when taken with alcohol. The reaction includes flushing, headache, nausea, vomiting, sweating, and increased thirst. The reaction can last from 30 minutes to several hours. What side effects may I notice from receiving this medicine? Side effects that you should report to your doctor or health care professional as soon as possible:  allergic reactions like skin rash, itching or hives, swelling of the face, lips, or tongue  breathing problems  changes in vision  fast, irregular heartbeat  high or low blood pressure  mouth sores  pain, tingling, numbness in the hands or feet  signs of decreased platelets or bleeding - bruising, pinpoint red spots on the skin, black, tarry stools, blood in the urine  signs of decreased red blood cells - unusually weak or tired, feeling faint or lightheaded, falls  signs of infection - fever or chills, cough, sore throat, pain or difficulty passing urine  signs and symptoms of liver injury like dark yellow or brown urine; general ill feeling or flu-like symptoms; light-colored stools; loss of appetite; nausea; right upper belly pain; unusually weak or tired; yellowing of the eyes or skin  swelling of the ankles, feet, hands  unusually slow heartbeat Side effects that usually do not require medical attention (report to your doctor or health care professional  if they continue or are bothersome):  diarrhea  hair loss  loss of appetite  muscle or joint pain  nausea, vomiting  pain, redness, or irritation at site where injected  tiredness This list may not describe all possible side effects. Call your doctor for medical advice about side effects. You may report side effects to FDA at 1-800-FDA-1088. Where should I keep my medicine? This drug is given in a hospital or clinic  and will not be stored at home. NOTE: This sheet is a summary. It may not cover all possible information. If you have questions about this medicine, talk to your doctor, pharmacist, or health care provider.  2020 Elsevier/Gold Standard (2017-03-03 13:14:55)   Ramucirumab injection What is this medicine? RAMUCIRUMAB (ra mue SIR ue mab) is a monoclonal antibody. It is used to treat stomach cancer, colorectal cancer, liver cancer, and lung cancer. This medicine may be used for other purposes; ask your health care provider or pharmacist if you have questions. COMMON BRAND NAME(S): Cyramza What should I tell my health care provider before I take this medicine? They need to know if you have any of these conditions:  bleeding disorders  blood clots  heart disease, including heart failure, heart attack, or chest pain (angina)  high blood pressure  infection (especially a virus infection such as chickenpox, cold sores, or herpes)  protein in your urine  recent or planning to have surgery  stroke  an unusual or allergic reaction to ramucirumab, other medicines, foods, dyes, or preservatives  pregnant or trying to get pregnant  breast-feeding How should I use this medicine? This medicine is for infusion into a vein. It is given by a health care professional in a hospital or clinic setting. Talk to your pediatrician regarding the use of this medicine in children. Special care may be needed. Overdosage: If you think you have taken too much of this medicine contact a poison control center or emergency room at once. NOTE: This medicine is only for you. Do not share this medicine with others. What if I miss a dose? It is important not to miss your dose. Call your doctor or health care professional if you are unable to keep an appointment. What may interact with this medicine? Interactions have not been studied. This list may not describe all possible interactions. Give your health care  provider a list of all the medicines, herbs, non-prescription drugs, or dietary supplements you use. Also tell them if you smoke, drink alcohol, or use illegal drugs. Some items may interact with your medicine. What should I watch for while using this medicine? Your condition will be monitored carefully while you are receiving this medicine. You will need to to check your blood pressure and have your blood and urine tested while you are taking this medicine. Your condition will be monitored carefully while you are receiving this medicine. This medicine may increase your risk to bruise or bleed. Call your doctor or health care professional if you notice any unusual bleeding. Before having surgery, talk to your health care provider to make sure it is ok. This drug can increase the risk of poor healing of your surgical site or wound. You will need to stop this drug for 28 days before surgery. After surgery, wait at least 2 weeks before restarting this drug. Make sure the surgical site or wound is healed enough before restarting this drug. Talk to your health care provider if questions. Do not become pregnant while taking this medicine or  for 3 months after stopping it. Women should inform their doctor if they wish to become pregnant or think they might be pregnant. There is a potential for serious side effects to an unborn child. Talk to your health care professional or pharmacist for more information. Do not breast-feed an infant while taking this medicine or for 2 months after stopping it. This medicine may interfere with the ability to have a child. Talk with your doctor or health care professional if you are concerned about your fertility. What side effects may I notice from receiving this medicine? Side effects that you should report to your doctor or health care professional as soon as possible:  allergic reactions like skin rash, itching or hives, breathing problems, swelling of the face, lips, or  tongue  signs of infection - fever or chills, cough, sore throat  chest pain or chest tightness  confusion  dizziness  feeling faint or lightheaded, falls  severe abdominal pain  severe nausea, vomiting  signs and symptoms of bleeding such as bloody or black, tarry stools; red or dark-brown urine; spitting up blood or brown material that looks like coffee grounds; red spots on the skin; unusual bruising or bleeding from the eye, gums, or nose  signs and symptoms of a blood clot such as breathing problems; changes in vision; chest pain; severe, sudden headache; pain, swelling, warmth in the leg; trouble speaking; sudden numbness or weakness of the face, arm or leg  symptoms of a stroke: change in mental awareness, inability to talk or move one side of the body  trouble walking, dizziness, loss of balance or coordination Side effects that usually do not require medical attention (report to your doctor or health care professional if they continue or are bothersome):  cold, clammy skin  constipation  diarrhea  headache  nausea, vomiting  stomach pain  unusually slow heartbeat  unusually weak or tired This list may not describe all possible side effects. Call your doctor for medical advice about side effects. You may report side effects to FDA at 1-800-FDA-1088. Where should I keep my medicine? This drug is given in a hospital or clinic and will not be stored at home. NOTE: This sheet is a summary. It may not cover all possible information. If you have questions about this medicine, talk to your doctor, pharmacist, or health care provider.  2020 Elsevier/Gold Standard (2019-04-27 11:17:50)

## 2020-06-18 NOTE — Progress Notes (Signed)
Spoke with Dr. Benay Spice re: pt did not take steroids PTA this am and also reported his HGB result of 7.5 also this am.Pt states he was not given a prescription for steroids. Dr. Benay Spice states to proceed with treatment with HGB level and just to have pt take steroid as part of pre-medication in treatment plan. Dr. Also aware that pt needs prescription - TPR Rechecked pulse and pt is still >100. Dr. Benay Spice notified and still okay to treat today.- TPR Pt tolerated first Cyramza infusion well. HR now 80-90s.- TPR 1202: Pt states his stomach is starting to hurt, rates 3/10. Denies nausea. Within a minute or two, nurse notes pt appears flushed. Cheeks are pink and almost simultaneously pt states he's hot. Taxol off. NS wide open. 2L O2 via nasal cannula placed for comfort. VS taken and documented.  See flowsheets. Sandi Mealy, PA to bedside. Rescue meds given. Pt slowly feeling better. Lucianne Lei to come back for further assessment.- TPR 1217: Pt states he's feeling better yet still rates pain 2.5/10. Pt denied any pain on admission to unit.- TPR Lucianne Lei Tanner back to assess pt. Infusion restarted at half rate, slowly titrating again. Pt tolerating well.- TPR The remaining Taxol was infused without difficulty. Pt stable and discharged home to daughter.- TPR

## 2020-06-18 NOTE — Progress Notes (Signed)
    DATE:  06/18/2020                                          X  CHEMO/IMMUNOTHERAPY REACTION           MD:  Dr. Dominica Severin B. Sherrill   AGENT/BLOOD PRODUCT RECEIVING TODAY:               Cyramza and paclitaxel   AGENT/BLOOD PRODUCT RECEIVING IMMEDIATELY PRIOR TO REACTION:           Paclitaxel   VS: BP:      140/57   P:        123       SPO2:        100% on room air                  REACTION(S):           Abdominal pain and flushing   PREMEDS:       Benadryl 25 mg, dexamethasone 20 mg, and Pepcid 20 mg   INTERVENTION: Paclitaxel was paused and the patient was given Benadryl 25 mg IV x1, Pepcid 20 mg IV x1, and Solu-Medrol 60 mg IV x1.   Review of Systems  Review of Systems  Constitutional: Negative for chills, diaphoresis and fever.  HENT: Negative for trouble swallowing and voice change.   Respiratory: Negative for cough, chest tightness, shortness of breath and wheezing.   Cardiovascular: Negative for chest pain and palpitations.  Gastrointestinal: Positive for abdominal pain. Negative for constipation, diarrhea, nausea and vomiting.  Musculoskeletal: Negative for back pain and myalgias.  Skin:       Flushing  Neurological: Negative for dizziness, light-headedness and headaches.     Physical Exam  Physical Exam Constitutional:      General: He is not in acute distress.    Appearance: He is not diaphoretic.  HENT:     Head: Normocephalic and atraumatic.  Cardiovascular:     Rate and Rhythm: Regular rhythm. Tachycardia present.     Heart sounds: Normal heart sounds. No murmur heard.  No friction rub.  Pulmonary:     Effort: Pulmonary effort is normal. No respiratory distress.     Breath sounds: Normal breath sounds. No wheezing or rales.  Abdominal:     General: Bowel sounds are normal. There is no distension.     Tenderness: There is no abdominal tenderness. There is no guarding.  Skin:    General: Skin is warm and dry.     Findings: No erythema or rash.   Neurological:     Mental Status: He is alert.  Psychiatric:        Mood and Affect: Mood normal.        Behavior: Behavior normal.        Thought Content: Thought content normal.        Judgment: Judgment normal.     OUTCOME:                 The patient's symptoms abated with after the above intervention.  Paclitaxel was restarted and was completed without any additional issues of concern.   Sandi Mealy, MHS, PA-C  This case was discussed with Dr. Benay Spice. He expressed agreement with my management of this patient.  Plans are to have premedications added the night before the patient's treatments.

## 2020-06-18 NOTE — Patient Instructions (Signed)

## 2020-06-19 ENCOUNTER — Other Ambulatory Visit: Payer: Self-pay | Admitting: *Deleted

## 2020-06-19 MED ORDER — DEXAMETHASONE 2 MG PO TABS: 10.0000 mg | ORAL_TABLET | ORAL | 3 refills | Status: DC

## 2020-06-19 NOTE — Progress Notes (Signed)
Patient had infusion reaction to paclitaxel, so decided by Dr. Benay Spice that he needs dexamethasone 10 mg night prior and am of each treatment.  Daughter notified.

## 2020-06-22 ENCOUNTER — Telehealth: Payer: Self-pay | Admitting: *Deleted

## 2020-06-22 NOTE — Telephone Encounter (Signed)
Voice mail left by daughter that he is having "shortness of breath with urination". Requested a call. Returned call was put to voice mail. Left message that return call was attempted.

## 2020-06-22 NOTE — Telephone Encounter (Signed)
Spoke w/daughter [place] was [place]. She has not other specifics to add. She called patient for conference call. He reports last night he stood to void in urinal and felt short of breath for a while. It has not happened since and he is able to ambulate in home without shortness of breath. Denies any chest pain.

## 2020-07-01 ENCOUNTER — Other Ambulatory Visit: Payer: Self-pay | Admitting: Oncology

## 2020-07-01 DIAGNOSIS — I639 Cerebral infarction, unspecified: Secondary | ICD-10-CM | POA: Diagnosis not present

## 2020-07-01 DIAGNOSIS — I2699 Other pulmonary embolism without acute cor pulmonale: Secondary | ICD-10-CM | POA: Diagnosis not present

## 2020-07-01 DIAGNOSIS — R140 Abdominal distension (gaseous): Secondary | ICD-10-CM | POA: Diagnosis not present

## 2020-07-01 DIAGNOSIS — C169 Malignant neoplasm of stomach, unspecified: Secondary | ICD-10-CM | POA: Diagnosis not present

## 2020-07-01 DIAGNOSIS — K311 Adult hypertrophic pyloric stenosis: Secondary | ICD-10-CM | POA: Diagnosis not present

## 2020-07-02 ENCOUNTER — Telehealth: Payer: Self-pay | Admitting: *Deleted

## 2020-07-02 NOTE — Telephone Encounter (Signed)
Daughter calling for directions on steriods for treatment tomorrow. Instructed her he is to take #5 tablets (10mg ) at bedtime tonight and at 6 am tomorrow.

## 2020-07-03 ENCOUNTER — Inpatient Hospital Stay: Payer: Medicare PPO

## 2020-07-03 ENCOUNTER — Inpatient Hospital Stay (HOSPITAL_BASED_OUTPATIENT_CLINIC_OR_DEPARTMENT_OTHER): Payer: Medicare PPO | Admitting: Nurse Practitioner

## 2020-07-03 ENCOUNTER — Other Ambulatory Visit: Payer: Self-pay

## 2020-07-03 ENCOUNTER — Encounter: Payer: Self-pay | Admitting: Nurse Practitioner

## 2020-07-03 VITALS — BP 135/69 | HR 81 | Resp 18

## 2020-07-03 VITALS — BP 138/74 | HR 73 | Temp 96.3°F | Resp 15 | Ht 66.5 in | Wt 142.8 lb

## 2020-07-03 DIAGNOSIS — C169 Malignant neoplasm of stomach, unspecified: Secondary | ICD-10-CM

## 2020-07-03 DIAGNOSIS — Z95828 Presence of other vascular implants and grafts: Secondary | ICD-10-CM

## 2020-07-03 DIAGNOSIS — G620 Drug-induced polyneuropathy: Secondary | ICD-10-CM | POA: Diagnosis not present

## 2020-07-03 DIAGNOSIS — D649 Anemia, unspecified: Secondary | ICD-10-CM | POA: Diagnosis not present

## 2020-07-03 DIAGNOSIS — C163 Malignant neoplasm of pyloric antrum: Secondary | ICD-10-CM | POA: Diagnosis not present

## 2020-07-03 DIAGNOSIS — Z5112 Encounter for antineoplastic immunotherapy: Secondary | ICD-10-CM | POA: Diagnosis not present

## 2020-07-03 DIAGNOSIS — T451X5A Adverse effect of antineoplastic and immunosuppressive drugs, initial encounter: Secondary | ICD-10-CM | POA: Diagnosis not present

## 2020-07-03 DIAGNOSIS — Z5111 Encounter for antineoplastic chemotherapy: Secondary | ICD-10-CM | POA: Diagnosis not present

## 2020-07-03 DIAGNOSIS — R109 Unspecified abdominal pain: Secondary | ICD-10-CM | POA: Diagnosis not present

## 2020-07-03 DIAGNOSIS — R5383 Other fatigue: Secondary | ICD-10-CM | POA: Diagnosis not present

## 2020-07-03 DIAGNOSIS — R197 Diarrhea, unspecified: Secondary | ICD-10-CM | POA: Diagnosis not present

## 2020-07-03 LAB — CMP (CANCER CENTER ONLY)
ALT: 12 U/L (ref 0–44)
AST: 17 U/L (ref 15–41)
Albumin: 2.8 g/dL — ABNORMAL LOW (ref 3.5–5.0)
Alkaline Phosphatase: 87 U/L (ref 38–126)
Anion gap: 8 (ref 5–15)
BUN: 13 mg/dL (ref 8–23)
CO2: 25 mmol/L (ref 22–32)
Calcium: 9.3 mg/dL (ref 8.9–10.3)
Chloride: 107 mmol/L (ref 98–111)
Creatinine: 0.73 mg/dL (ref 0.61–1.24)
GFR, Estimated: >60 mL/min (ref >60)
Glucose, Bld: 139 mg/dL — ABNORMAL HIGH (ref 70–99)
Potassium: 4 mmol/L (ref 3.5–5.1)
Sodium: 140 mmol/L (ref 135–145)
Total Bilirubin: 0.3 mg/dL (ref 0.3–1.2)
Total Protein: 6.9 g/dL (ref 6.5–8.1)

## 2020-07-03 LAB — CBC WITH DIFFERENTIAL (CANCER CENTER ONLY)
Abs Immature Granulocytes: 0.01 10*3/uL (ref 0.00–0.07)
Basophils Absolute: 0 10*3/uL (ref 0.0–0.1)
Basophils Relative: 0 %
Eosinophils Absolute: 0 10*3/uL (ref 0.0–0.5)
Eosinophils Relative: 0 %
HCT: 27.7 % — ABNORMAL LOW (ref 39.0–52.0)
Hemoglobin: 8.4 g/dL — ABNORMAL LOW (ref 13.0–17.0)
Immature Granulocytes: 0 %
Lymphocytes Relative: 15 %
Lymphs Abs: 0.5 10*3/uL — ABNORMAL LOW (ref 0.7–4.0)
MCH: 27 pg (ref 26.0–34.0)
MCHC: 30.3 g/dL (ref 30.0–36.0)
MCV: 89.1 fL (ref 80.0–100.0)
Monocytes Absolute: 0.1 10*3/uL (ref 0.1–1.0)
Monocytes Relative: 2 %
Neutro Abs: 2.6 10*3/uL (ref 1.7–7.7)
Neutrophils Relative %: 83 %
Platelet Count: 334 10*3/uL (ref 150–400)
RBC: 3.11 MIL/uL — ABNORMAL LOW (ref 4.22–5.81)
RDW: 15.8 % — ABNORMAL HIGH (ref 11.5–15.5)
WBC Count: 3.2 10*3/uL — ABNORMAL LOW (ref 4.0–10.5)
nRBC: 0 % (ref 0.0–0.2)

## 2020-07-03 LAB — C DIFFICILE QUICK SCREEN W PCR REFLEX
C Diff antigen: NEGATIVE
C Diff interpretation: NOT DETECTED
C Diff toxin: NEGATIVE

## 2020-07-03 LAB — VITAMIN B12: Vitamin B-12: 1177 pg/mL — ABNORMAL HIGH (ref 180–914)

## 2020-07-03 LAB — SAMPLE TO BLOOD BANK

## 2020-07-03 LAB — TSH: TSH: 0.474 u[IU]/mL (ref 0.320–4.118)

## 2020-07-03 MED ORDER — SODIUM CHLORIDE 0.9% FLUSH
10.0000 mL | Freq: Once | INTRAVENOUS | Status: AC
  Administered 2020-07-03: 13:00:00 10 mL
  Filled 2020-07-03: qty 10

## 2020-07-03 MED ORDER — SODIUM CHLORIDE 0.9 % IV SOLN
INTRAVENOUS | Status: AC
  Filled 2020-07-03 (×2): qty 250

## 2020-07-03 MED ORDER — HEPARIN SOD (PORK) LOCK FLUSH 100 UNIT/ML IV SOLN
500.0000 [IU] | Freq: Once | INTRAVENOUS | Status: AC
  Administered 2020-07-03: 13:00:00 500 [IU]
  Filled 2020-07-03: qty 5

## 2020-07-03 MED ORDER — SODIUM CHLORIDE 0.9% FLUSH
10.0000 mL | Freq: Once | INTRAVENOUS | Status: AC
  Administered 2020-07-03: 09:00:00 10 mL
  Filled 2020-07-03: qty 10

## 2020-07-03 NOTE — Patient Instructions (Signed)

## 2020-07-03 NOTE — Progress Notes (Addendum)
[place] OFFICE PROGRESS NOTE   Diagnosis: Gastric cancer  INTERVAL HISTORY:   Drew [name] returns as scheduled.  He completed cycle 1 Taxol/ramucirumab 06/18/2020.  He developed abdominal pain and flushing during the Taxol infusion.  The infusion was stopped and he received Benadryl, Pepcid and Solu-Medrol.  Symptoms resolved, Taxol resumed and completed without further incident.  He had hiccups and nausea after the chemotherapy. He began having loose stools about 5 days ago. Daughter reports 3 to 4/day, patient reports 1 to 2/day. Some episodes have been incontinent. He is eating 3-4 small meals per day but reports he is hungry. No fever. No bleeding. He "sits in a chair" most of the day. He has "no energy". He denies pain. He reports a sore in the left nostril and at the left arm.  His daughter notes he has a shuffling gait.  Stable neuropathy symptoms.  Objective:  Vital signs in last 24 hours:  Blood pressure 138/74, pulse 73, temperature (!) 96.3 F (35.7 C), temperature source Tympanic, resp. rate 15, height 5' 6.5" (1.689 m), weight 142 lb 12.8 oz (64.8 kg), SpO2 100 %.    HEENT: White coating over tongue. No buccal thrush. Resp: Lungs clear bilaterally. Cardio: Regular rate and rhythm. GI: No hepatosplenomegaly, no mass, nontender. Vascular: No leg edema.  Skin: Small hypopigmented skin lesion left mid arm. Port-A-Cath without erythema.  Lab Results:  Lab Results  Component Value Date   WBC 3.2 (L) 07/03/2020   HGB 8.4 (L) 07/03/2020   HCT 27.7 (L) 07/03/2020   MCV 89.1 07/03/2020   PLT 334 07/03/2020   NEUTROABS 2.6 07/03/2020    Imaging:  No results found.  Medications: I have reviewed the patient's current medications.  Assessment/Plan: 1. 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  CT abdomen/pelvis at [facility] 01/03/2020-masslike wall thickening of the gastric antrum, numerous liver metastases, small volume ascites,  numerous peritoneal nodules, enlarged periaortic and porta hepatis lymph nodes  Upper endoscopy 01/06/2020-large ulcer in the gastric antrum with heaped up and firm edges, Duodenal bulb largely "obliterated"by the mass, biopsy confirmed adenocarcinoma, HER-2 3+, intact mismatch repair protein expression, PD-L1: 4   CT chest 01/10/2020-acute bilateral pulmonary emboli, no lymphadenopathy or suspicious lung nodules  Baseline echo 01/17/20 at [place], EF 60-65%  CT abdomen/pelvis 01/22/2020, gastric mass, liver metastases, peritoneal/omental nodules, ascites, right pleural effusion  Therapeutic and diagnostic paracentesis on 01/27/2020, 2.1 L removed;cytology shows malignant cells,malignant ascites  PD-L1combined positive score: 1 (from ascites/periteoneal fluid), reported 02/01/20   Cycle 1 FOLFOX with trastuzumab and pembrolizumab, 01/31/2020  Baseline CEA 13.6 (02/08/2020)  Cycle 2 FOLFOX/trastuzumab 02/15/20  Pembrolizumab 02/22/2020  Cycle 3 FOLFOX/trastuzumab 02/28/2020  Cycle 4 FOLFOX/trastuzumab 03/14/2020  Pembrolizumab 03/14/2020  CTs 03/27/2020-multifocal liver metastases again noted, appear mildly decreased in size in the interval. Persistent diffuse peritoneal disease with mild decrease in size of index peritoneal lesions. Similar volume of upper abdominal ascites. Small right pleural effusion is decreased.  Cycle 5 FOLFOX/trastuzumab 03/28/2020  Pembrolizumab 9/22/202  Cycle 6 FOLFOX/trastuzumab 04/10/2020  Pembrolizumab 04/24/2020  Cycle 7 FOLFOX/trastuzumab 04/24/2020  Cycle 8 FOLFOX/trastuzumab 05/08/2020  Pembrolizumab 05/15/2020  CTs 05/21/2020-mildly worsened omental caking of tumor. Mild perihepatic ascites. Marked irregular gastric wall thickening in the gastric antrum traversed by gastric stent.  Cycle95-FU/trastuzumab 05/22/2020  Upper endoscopy 06/04/2020-moderately severe esophagitis; large, fungating polypoid and ulcerated circumferential mass in the  gastric antrum, no bleeding/recent bleeding; obvious stenosis of stent with significant tumor ingrowth; stent placed  Cycle 1 Taxol/ramucirumab 06/18/2020  2.Nausea/vomiting secondary to #1 with gastric outlet obstruction  Gastric stent placement 01/12/2020, n/v resolved  Admission with intractable nausea/vomiting 06/02/2020-upper endoscopy 06/04/2000-tumor ingrowth in gastric antrum stent, new stent placed, gastric antrum mass  3.Acute bilateral lower lobe pulmonary emboli on CT 01/10/2020-treated with apixaban,(givenLovenoxduring hospitalization), apixaban discontinued during hospital admission November 2021  4.Anemia-likely secondary to bleeding from the gastric mass, chemotherapy, and chronic disease  5.Status post Port-A-Cath placement 01/13/2020  6.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 Hospital admission 06/02/2020-intractable nausea/vomiting secondary to tumor ingrowth at the gastric pylorus stent  12.  Abdominal pain and flushing during Taxol infusion 06/18/2020-resolved after receiving Benadryl, Pepcid and Solu-Medrol.  Taxol resumed and completed.  Dexamethasone home premedication added.   Disposition: Drew [name] has completed 1 cycle of Taxol/ramucirumab.  He has been having diarrhea for the past 5 days.  We decided to hold today's treatment and reschedule for 1 week.  He will receive IV fluids today.  He will  submit a stool sample for C. difficile testing.  We added additional labs today including chemistry panel, B12, TSH.  He will return for lab, follow-up, possible treatment in 1 week.  We are available to see him sooner if needed.  Patient seen with Dr. Benay Spice.    Ned Card ANP/GNP-BC   07/03/2020  9:45 AM This was a shared visit with Ned Card.  Drew [name] was interviewed and examined.  His daughter was present for today's visit.  He has developed diarrhea over the past several days.  The diarrhea may be related to nutrition supplements, the gastric tumor, or another etiology.  We doubt the diarrhea is related to the current systemic therapy.  Chemotherapy will be held today.  We will obtain additional evaluation to include stool testing for the C. difficile toxin.  He reports a shuffling gait.  We checked vitamin B12 and TSH levels today.  I have a low clinical suspicion for CNS metastases.  Julieanne Manson, MD

## 2020-07-03 NOTE — Progress Notes (Signed)
Nutrition Follow-up:  Patient with metastatic gastric cancer.  Patient treatment changed to taxol/ramucirumab.  Chemo held today.   Met with patient during IV fluids infusion.  Patient reports issues with diarrhea.  Reports that he has been trying to eat (eggs, chicken and rice, pimento cheese sandwich, cereal). Has been drinking ensure and asking RD for more today.   Noted hospital admission with tumor ingrowth at gastric stent.  New stent placed.       Medications: reviewed  Labs: reviewed  Anthropometrics:   Weight 142 lb 12.8 oz today decreased from 155 lb  On 11/09    NUTRITION DIAGNOSIS: Unintentional weight loss continues   INTERVENTION:  Discussed strategies to help with diarrhea. Handout provided. Case of ensure left at front desk for patient to pick up on way out after infusion.     MONITORING, EVALUATION, GOAL: weight trends, intake   NEXT VISIT: to be determined with treatment  Jessiah Wojnar B. Zenia Resides, [place], [place] Registered Dietitian [phone] (mobile)

## 2020-07-05 ENCOUNTER — Inpatient Hospital Stay: Payer: Medicare PPO

## 2020-07-06 ENCOUNTER — Other Ambulatory Visit: Payer: Self-pay | Admitting: Oncology

## 2020-07-06 ENCOUNTER — Other Ambulatory Visit: Payer: Self-pay | Admitting: Nurse Practitioner

## 2020-07-06 DIAGNOSIS — C169 Malignant neoplasm of stomach, unspecified: Secondary | ICD-10-CM

## 2020-07-10 ENCOUNTER — Other Ambulatory Visit: Payer: Medicare PPO

## 2020-07-10 ENCOUNTER — Telehealth: Payer: Self-pay | Admitting: Oncology

## 2020-07-10 ENCOUNTER — Other Ambulatory Visit: Payer: Self-pay | Admitting: *Deleted

## 2020-07-10 ENCOUNTER — Ambulatory Visit: Payer: Medicare PPO | Admitting: Nurse Practitioner

## 2020-07-10 DIAGNOSIS — C169 Malignant neoplasm of stomach, unspecified: Secondary | ICD-10-CM

## 2020-07-10 NOTE — Telephone Encounter (Signed)
Scheduled appointments per 12/21 los. Spoke to patient's daughter who is aware of appointments dates and times.

## 2020-07-10 NOTE — Telephone Encounter (Signed)
Drew [name], Sending to you since this has been in the pool since 12/24. Lorayne Marek, RN

## 2020-07-11 ENCOUNTER — Other Ambulatory Visit: Payer: Self-pay | Admitting: Nurse Practitioner

## 2020-07-11 ENCOUNTER — Encounter: Payer: Self-pay | Admitting: Oncology

## 2020-07-11 DIAGNOSIS — C169 Malignant neoplasm of stomach, unspecified: Secondary | ICD-10-CM

## 2020-07-12 ENCOUNTER — Other Ambulatory Visit: Payer: Medicare PPO

## 2020-07-12 ENCOUNTER — Inpatient Hospital Stay: Payer: Medicare PPO

## 2020-07-12 ENCOUNTER — Inpatient Hospital Stay (HOSPITAL_BASED_OUTPATIENT_CLINIC_OR_DEPARTMENT_OTHER): Payer: Medicare PPO | Admitting: Oncology

## 2020-07-12 ENCOUNTER — Encounter: Payer: Self-pay | Admitting: Oncology

## 2020-07-12 ENCOUNTER — Ambulatory Visit: Payer: Medicare PPO | Admitting: Oncology

## 2020-07-12 ENCOUNTER — Other Ambulatory Visit: Payer: Self-pay

## 2020-07-12 VITALS — BP 121/62 | HR 80 | Temp 97.6°F | Resp 18 | Ht 66.5 in | Wt 152.3 lb

## 2020-07-12 DIAGNOSIS — Z95828 Presence of other vascular implants and grafts: Secondary | ICD-10-CM

## 2020-07-12 DIAGNOSIS — C169 Malignant neoplasm of stomach, unspecified: Secondary | ICD-10-CM | POA: Diagnosis not present

## 2020-07-12 DIAGNOSIS — C163 Malignant neoplasm of pyloric antrum: Secondary | ICD-10-CM | POA: Diagnosis not present

## 2020-07-12 DIAGNOSIS — Z5112 Encounter for antineoplastic immunotherapy: Secondary | ICD-10-CM | POA: Diagnosis not present

## 2020-07-12 DIAGNOSIS — T451X5A Adverse effect of antineoplastic and immunosuppressive drugs, initial encounter: Secondary | ICD-10-CM | POA: Diagnosis not present

## 2020-07-12 DIAGNOSIS — D649 Anemia, unspecified: Secondary | ICD-10-CM | POA: Diagnosis not present

## 2020-07-12 DIAGNOSIS — Z5111 Encounter for antineoplastic chemotherapy: Secondary | ICD-10-CM | POA: Diagnosis not present

## 2020-07-12 DIAGNOSIS — G620 Drug-induced polyneuropathy: Secondary | ICD-10-CM | POA: Diagnosis not present

## 2020-07-12 DIAGNOSIS — R197 Diarrhea, unspecified: Secondary | ICD-10-CM | POA: Diagnosis not present

## 2020-07-12 DIAGNOSIS — R5383 Other fatigue: Secondary | ICD-10-CM | POA: Diagnosis not present

## 2020-07-12 DIAGNOSIS — R109 Unspecified abdominal pain: Secondary | ICD-10-CM | POA: Diagnosis not present

## 2020-07-12 LAB — CMP (CANCER CENTER ONLY)
ALT: 10 U/L (ref 0–44)
AST: 19 U/L (ref 15–41)
Albumin: 2.6 g/dL — ABNORMAL LOW (ref 3.5–5.0)
Alkaline Phosphatase: 73 U/L (ref 38–126)
Anion gap: 3 — ABNORMAL LOW (ref 5–15)
BUN: 9 mg/dL (ref 8–23)
CO2: 26 mmol/L (ref 22–32)
Calcium: 8.6 mg/dL — ABNORMAL LOW (ref 8.9–10.3)
Chloride: 107 mmol/L (ref 98–111)
Creatinine: 0.7 mg/dL (ref 0.61–1.24)
GFR, Estimated: >60 mL/min (ref >60)
Glucose, Bld: 133 mg/dL — ABNORMAL HIGH (ref 70–99)
Potassium: 4 mmol/L (ref 3.5–5.1)
Sodium: 136 mmol/L (ref 135–145)
Total Bilirubin: 0.3 mg/dL (ref 0.3–1.2)
Total Protein: 6.1 g/dL — ABNORMAL LOW (ref 6.5–8.1)

## 2020-07-12 LAB — CBC WITH DIFFERENTIAL (CANCER CENTER ONLY)
Abs Immature Granulocytes: 0.03 10*3/uL (ref 0.00–0.07)
Basophils Absolute: 0 10*3/uL (ref 0.0–0.1)
Basophils Relative: 0 %
Eosinophils Absolute: 0 10*3/uL (ref 0.0–0.5)
Eosinophils Relative: 1 %
HCT: 25.8 % — ABNORMAL LOW (ref 39.0–52.0)
Hemoglobin: 8 g/dL — ABNORMAL LOW (ref 13.0–17.0)
Immature Granulocytes: 0 %
Lymphocytes Relative: 15 %
Lymphs Abs: 1.1 10*3/uL (ref 0.7–4.0)
MCH: 28 pg (ref 26.0–34.0)
MCHC: 31 g/dL (ref 30.0–36.0)
MCV: 90.2 fL (ref 80.0–100.0)
Monocytes Absolute: 0.6 10*3/uL (ref 0.1–1.0)
Monocytes Relative: 8 %
Neutro Abs: 5.7 10*3/uL (ref 1.7–7.7)
Neutrophils Relative %: 76 %
Platelet Count: 232 10*3/uL (ref 150–400)
RBC: 2.86 MIL/uL — ABNORMAL LOW (ref 4.22–5.81)
RDW: 16.4 % — ABNORMAL HIGH (ref 11.5–15.5)
WBC Count: 7.4 10*3/uL (ref 4.0–10.5)
nRBC: 0 % (ref 0.0–0.2)

## 2020-07-12 LAB — MAGNESIUM: Magnesium: 2 mg/dL (ref 1.7–2.4)

## 2020-07-12 MED ORDER — HEPARIN SOD (PORK) LOCK FLUSH 100 UNIT/ML IV SOLN
500.0000 [IU] | Freq: Once | INTRAVENOUS | Status: AC
Start: 2020-07-12 — End: 2020-07-12
  Administered 2020-07-12: 13:00:00 500 [IU]
  Filled 2020-07-12: qty 5

## 2020-07-12 MED ORDER — SODIUM CHLORIDE 0.9% FLUSH
10.0000 mL | Freq: Once | INTRAVENOUS | Status: AC
  Administered 2020-07-12: 13:00:00 10 mL
  Filled 2020-07-12: qty 10

## 2020-07-12 NOTE — Progress Notes (Signed)
[place] OFFICE PROGRESS NOTE   Diagnosis: Gastric cancer  INTERVAL HISTORY:   Mr. Ghosh returns as scheduled.  He reports feeling well.  No bleeding.  Diarrhea has resolved.  He reports having a normal bowel movement.  He continues to have gait instability.  Objective:  Vital signs in last 24 hours:  Blood pressure 121/62, pulse 80, temperature 97.6 F (36.4 C), temperature source Tympanic, resp. rate 18, height 5' 6.5" (1.689 m), weight 152 lb 4.8 oz (69.1 kg), SpO2 100 %.    HEENT: Mild white coat over the tongue, no buccal thrush Resp: Lungs clear bilaterally Cardio: Regular rate and rhythm GI: No hepatosplenomegaly, no mass, mild tenderness in the right upper abdomen Vascular: Trace pitting edema at the lower leg bilaterally Neuro: Alert and oriented, the motor exam appears intact in the upper and lower extremities bilaterally, he walks with a shuffling gait    Portacath/PICC-without erythema  Lab Results:  Lab Results  Component Value Date   WBC 7.4 07/12/2020   HGB 8.0 (L) 07/12/2020   HCT 25.8 (L) 07/12/2020   MCV 90.2 07/12/2020   PLT 232 07/12/2020   NEUTROABS 5.7 07/12/2020    CMP  Lab Results  Component Value Date   NA 140 07/03/2020   K 4.0 07/03/2020   CL 107 07/03/2020   CO2 25 07/03/2020   GLUCOSE 139 (H) 07/03/2020   BUN 13 07/03/2020   CREATININE 0.73 07/03/2020   CALCIUM 9.3 07/03/2020   PROT 6.9 07/03/2020   ALBUMIN 2.8 (L) 07/03/2020   AST 17 07/03/2020   ALT 12 07/03/2020   ALKPHOS 87 07/03/2020   BILITOT 0.3 07/03/2020   GFRNONAA >60 07/03/2020   GFRAA >60 04/10/2020    Lab Results  Component Value Date   CEA1 14.92 (H) 04/24/2020     Medications: I have reviewed the patient's current medications.   Assessment/Plan: 1. 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  CT abdomen/pelvis at [facility] 01/03/2020-masslike wall thickening of the gastric antrum, numerous liver metastases,  small volume ascites, numerous peritoneal nodules, enlarged periaortic and porta hepatis lymph nodes  Upper endoscopy 01/06/2020-large ulcer in the gastric antrum with heaped up and firm edges, Duodenal bulb largely "obliterated"by the mass, biopsy confirmed adenocarcinoma, HER-2 3+, intact mismatch repair protein expression, PD-L1: 4   CT chest 01/10/2020-acute bilateral pulmonary emboli, no lymphadenopathy or suspicious lung nodules  Baseline echo 01/17/20 at [place], EF 60-65%  CT abdomen/pelvis 01/22/2020, gastric mass, liver metastases, peritoneal/omental nodules, ascites, right pleural effusion  Therapeutic and diagnostic paracentesis on 01/27/2020, 2.1 L removed;cytology shows malignant cells,malignant ascites  PD-L1combined positive score: 1 (from ascites/periteoneal fluid), reported 02/01/20   Cycle 1 FOLFOX with trastuzumab and pembrolizumab, 01/31/2020  Baseline CEA 13.6 (02/08/2020)  Cycle 2 FOLFOX/trastuzumab 02/15/20  Pembrolizumab 02/22/2020  Cycle 3 FOLFOX/trastuzumab 02/28/2020  Cycle 4 FOLFOX/trastuzumab 03/14/2020  Pembrolizumab 03/14/2020  CTs 03/27/2020-multifocal liver metastases again noted, appear mildly decreased in size in the interval. Persistent diffuse peritoneal disease with mild decrease in size of index peritoneal lesions. Similar volume of upper abdominal ascites. Small right pleural effusion is decreased.  Cycle 5 FOLFOX/trastuzumab 03/28/2020  Pembrolizumab 9/22/202  Cycle 6 FOLFOX/trastuzumab 04/10/2020  Pembrolizumab 04/24/2020  Cycle 7 FOLFOX/trastuzumab 04/24/2020  Cycle 8 FOLFOX/trastuzumab 05/08/2020  Pembrolizumab 05/15/2020  CTs 05/21/2020-mildly worsened omental caking of tumor. Mild perihepatic ascites. Marked irregular gastric wall thickening in the gastric antrum traversed by gastric stent.  Cycle95-FU/trastuzumab 05/22/2020  Upper endoscopy 06/04/2020-moderately severe esophagitis; large, fungating polypoid and ulcerated  circumferential mass in the gastric antrum, no bleeding/recent bleeding; obvious stenosis of stent with significant tumor ingrowth; stent placed  Cycle 1 Taxol/ramucirumab 06/18/2020  Cycle 2 Taxol/ramucirumab 07/13/2020  2.Nausea/vomiting secondary to #1 with gastric outlet obstruction  Gastric stent placement 01/12/2020, n/v resolved  Admission with intractable nausea/vomiting 06/02/2020-upper endoscopy 06/04/2000-tumor ingrowth in gastric antrum stent, new stent placed, gastric antrum mass  3.Acute bilateral lower lobe pulmonary emboli on CT 01/10/2020-treated with apixaban,(givenLovenoxduring hospitalization), apixaban discontinued during hospital admission November 2021  4.Anemia-likely secondary to bleeding from the gastric mass, chemotherapy, and chronic disease  5.Status post Port-A-Cath placement 01/13/2020  6.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 Hospital admission 06/02/2020-intractable nausea/vomiting secondary to tumor ingrowth at the gastric pylorus stent  12.  Abdominal pain and flushing during Taxol infusion 06/18/2020-resolved after receiving Benadryl, Pepcid and Solu-Medrol.  Taxol resumed and completed.  Dexamethasone home premedication added.     Disposition: Mr. Yackley appears improved today.  Diarrhea has resolved.  He has gained weight.  He continues to have a gait abnormality.  He is  scheduled to see Dr. Tomi Likens in 2 weeks.  We will order a brain MRI to evaluate the CNS.  He had CVAs and indeterminate enhancing lesions on MRI at [facility] in June.  Mr. Sample will complete another cycle of Taxol/ramucirumab tomorrow.  He will return for an office visit and chemotherapy on 07/26/2020.    Betsy Coder, MD  07/12/2020  2:10 PM

## 2020-07-13 ENCOUNTER — Other Ambulatory Visit: Payer: Self-pay

## 2020-07-13 ENCOUNTER — Inpatient Hospital Stay: Payer: Medicare PPO

## 2020-07-13 VITALS — BP 133/76 | HR 76 | Temp 97.0°F | Resp 16

## 2020-07-13 DIAGNOSIS — R109 Unspecified abdominal pain: Secondary | ICD-10-CM | POA: Diagnosis not present

## 2020-07-13 DIAGNOSIS — R5383 Other fatigue: Secondary | ICD-10-CM | POA: Diagnosis not present

## 2020-07-13 DIAGNOSIS — C169 Malignant neoplasm of stomach, unspecified: Secondary | ICD-10-CM

## 2020-07-13 DIAGNOSIS — Z5111 Encounter for antineoplastic chemotherapy: Secondary | ICD-10-CM | POA: Diagnosis not present

## 2020-07-13 DIAGNOSIS — R197 Diarrhea, unspecified: Secondary | ICD-10-CM | POA: Diagnosis not present

## 2020-07-13 DIAGNOSIS — G620 Drug-induced polyneuropathy: Secondary | ICD-10-CM | POA: Diagnosis not present

## 2020-07-13 DIAGNOSIS — T451X5A Adverse effect of antineoplastic and immunosuppressive drugs, initial encounter: Secondary | ICD-10-CM | POA: Diagnosis not present

## 2020-07-13 DIAGNOSIS — Z5112 Encounter for antineoplastic immunotherapy: Secondary | ICD-10-CM | POA: Diagnosis not present

## 2020-07-13 DIAGNOSIS — D649 Anemia, unspecified: Secondary | ICD-10-CM | POA: Diagnosis not present

## 2020-07-13 DIAGNOSIS — C163 Malignant neoplasm of pyloric antrum: Secondary | ICD-10-CM | POA: Diagnosis not present

## 2020-07-13 MED ORDER — FAMOTIDINE IN NACL 20-0.9 MG/50ML-% IV SOLN
INTRAVENOUS | Status: AC
  Filled 2020-07-13: qty 50

## 2020-07-13 MED ORDER — SODIUM CHLORIDE 0.9 % IV SOLN
Freq: Once | INTRAVENOUS | Status: AC
  Filled 2020-07-13: qty 250

## 2020-07-13 MED ORDER — DIPHENHYDRAMINE HCL 50 MG/ML IJ SOLN
INTRAMUSCULAR | Status: AC
  Filled 2020-07-13: qty 1

## 2020-07-13 MED ORDER — SODIUM CHLORIDE 0.9 % IV SOLN
10.0000 mg | Freq: Once | INTRAVENOUS | Status: AC
  Administered 2020-07-13: 10 mg via INTRAVENOUS
  Filled 2020-07-13: qty 1
  Filled 2020-07-13: qty 10

## 2020-07-13 MED ORDER — FAMOTIDINE IN NACL 20-0.9 MG/50ML-% IV SOLN
20.0000 mg | Freq: Once | INTRAVENOUS | Status: AC
  Administered 2020-07-13: 20 mg via INTRAVENOUS

## 2020-07-13 MED ORDER — SODIUM CHLORIDE 0.9% FLUSH
10.0000 mL | INTRAVENOUS | Status: DC | PRN
  Administered 2020-07-13: 10 mL
  Filled 2020-07-13: qty 10

## 2020-07-13 MED ORDER — HEPARIN SOD (PORK) LOCK FLUSH 100 UNIT/ML IV SOLN
500.0000 [IU] | Freq: Once | INTRAVENOUS | Status: AC | PRN
  Administered 2020-07-13: 500 [IU]
  Filled 2020-07-13: qty 5

## 2020-07-13 MED ORDER — SODIUM CHLORIDE 0.9 % IV SOLN
80.0000 mg/m2 | Freq: Once | INTRAVENOUS | Status: AC
  Administered 2020-07-13: 144 mg via INTRAVENOUS
  Filled 2020-07-13: qty 24

## 2020-07-13 MED ORDER — DIPHENHYDRAMINE HCL 50 MG/ML IJ SOLN
25.0000 mg | Freq: Once | INTRAMUSCULAR | Status: AC
  Administered 2020-07-13: 25 mg via INTRAVENOUS

## 2020-07-13 MED ORDER — SODIUM CHLORIDE 0.9 % IV SOLN
8.0000 mg/kg | Freq: Once | INTRAVENOUS | Status: AC
  Administered 2020-07-13: 500 mg via INTRAVENOUS
  Filled 2020-07-13: qty 50

## 2020-07-13 NOTE — Patient Instructions (Signed)
[facility] Discharge Instructions for Patients Receiving Chemotherapy  Today you received the following chemotherapy agents: Cyramza, Taxol  To help prevent nausea and vomiting after your treatment, we encourage you to take your nausea medication as directed.   If you develop nausea and vomiting that is not controlled by your nausea medication, call the clinic.   BELOW ARE SYMPTOMS THAT SHOULD BE REPORTED IMMEDIATELY:  *FEVER GREATER THAN 100.5 F  *CHILLS WITH OR WITHOUT FEVER  NAUSEA AND VOMITING THAT IS NOT CONTROLLED WITH YOUR NAUSEA MEDICATION  *UNUSUAL SHORTNESS OF BREATH  *UNUSUAL BRUISING OR BLEEDING  TENDERNESS IN MOUTH AND THROAT WITH OR WITHOUT PRESENCE OF ULCERS  *URINARY PROBLEMS  *BOWEL PROBLEMS  UNUSUAL RASH Items with * indicate a potential emergency and should be followed up as soon as possible.  Feel free to call the clinic should you have any questions or concerns. The clinic phone number is [phone].  Please show the CHEMO ALERT CARD at check-in to the Emergency Department and triage nurse.   

## 2020-07-13 NOTE — Progress Notes (Signed)
Contacted Dr. Truett Perna and received a verbal order for an additional 25 mg Benadryl since patient reacted to 1st time Taxol.

## 2020-07-16 ENCOUNTER — Telehealth: Payer: Self-pay | Admitting: Oncology

## 2020-07-16 ENCOUNTER — Other Ambulatory Visit: Payer: Medicare PPO

## 2020-07-16 ENCOUNTER — Ambulatory Visit: Payer: Medicare PPO | Admitting: Nurse Practitioner

## 2020-07-16 NOTE — Telephone Encounter (Signed)
Scheduled appointments per 12/30 los. Spoke to patient's daughter who is aware of appointments date and times.

## 2020-07-18 ENCOUNTER — Emergency Department (HOSPITAL_COMMUNITY)
Admission: EM | Admit: 2020-07-18 | Discharge: 2020-07-18 | Disposition: A | Discharge status: Home / Self Care | Payer: Medicare PPO | Attending: Emergency Medicine | Admitting: Emergency Medicine

## 2020-07-18 ENCOUNTER — Other Ambulatory Visit: Payer: Self-pay

## 2020-07-18 ENCOUNTER — Telehealth: Payer: Self-pay | Admitting: *Deleted

## 2020-07-18 ENCOUNTER — Encounter (HOSPITAL_COMMUNITY): Payer: Self-pay

## 2020-07-18 ENCOUNTER — Telehealth: Payer: Self-pay | Admitting: Emergency Medicine

## 2020-07-18 DIAGNOSIS — Z79899 Other long term (current) drug therapy: Secondary | ICD-10-CM | POA: Diagnosis not present

## 2020-07-18 DIAGNOSIS — R69 Illness, unspecified: Secondary | ICD-10-CM | POA: Diagnosis not present

## 2020-07-18 DIAGNOSIS — Z9689 Presence of other specified functional implants: Secondary | ICD-10-CM | POA: Insufficient documentation

## 2020-07-18 DIAGNOSIS — R531 Weakness: Secondary | ICD-10-CM | POA: Diagnosis not present

## 2020-07-18 DIAGNOSIS — I10 Essential (primary) hypertension: Secondary | ICD-10-CM | POA: Diagnosis not present

## 2020-07-18 DIAGNOSIS — R197 Diarrhea, unspecified: Secondary | ICD-10-CM | POA: Insufficient documentation

## 2020-07-18 DIAGNOSIS — Z87891 Personal history of nicotine dependence: Secondary | ICD-10-CM | POA: Insufficient documentation

## 2020-07-18 DIAGNOSIS — R410 Disorientation, unspecified: Secondary | ICD-10-CM | POA: Diagnosis not present

## 2020-07-18 LAB — CBC WITH DIFFERENTIAL/PLATELET
Abs Immature Granulocytes: 0.06 10*3/uL (ref 0.00–0.07)
Basophils Absolute: 0 10*3/uL (ref 0.0–0.1)
Basophils Relative: 0 %
Eosinophils Absolute: 0 10*3/uL (ref 0.0–0.5)
Eosinophils Relative: 0 %
HCT: 25.4 % — ABNORMAL LOW (ref 39.0–52.0)
Hemoglobin: 7.8 g/dL — ABNORMAL LOW (ref 13.0–17.0)
Immature Granulocytes: 1 %
Lymphocytes Relative: 10 %
Lymphs Abs: 0.6 10*3/uL — ABNORMAL LOW (ref 0.7–4.0)
MCH: 27.7 pg (ref 26.0–34.0)
MCHC: 30.7 g/dL (ref 30.0–36.0)
MCV: 90.1 fL (ref 80.0–100.0)
Monocytes Absolute: 0.3 10*3/uL (ref 0.1–1.0)
Monocytes Relative: 6 %
Neutro Abs: 4.9 10*3/uL (ref 1.7–7.7)
Neutrophils Relative %: 83 %
Platelets: 232 10*3/uL (ref 150–400)
RBC: 2.82 MIL/uL — ABNORMAL LOW (ref 4.22–5.81)
RDW: 16.3 % — ABNORMAL HIGH (ref 11.5–15.5)
WBC: 5.9 10*3/uL (ref 4.0–10.5)
nRBC: 0 % (ref 0.0–0.2)

## 2020-07-18 LAB — COMPREHENSIVE METABOLIC PANEL
ALT: 12 U/L (ref 0–44)
AST: 16 U/L (ref 15–41)
Albumin: 2.9 g/dL — ABNORMAL LOW (ref 3.5–5.0)
Alkaline Phosphatase: 57 U/L (ref 38–126)
Anion gap: 9 (ref 5–15)
BUN: 9 mg/dL (ref 8–23)
CO2: 25 mmol/L (ref 22–32)
Calcium: 8.3 mg/dL — ABNORMAL LOW (ref 8.9–10.3)
Chloride: 97 mmol/L — ABNORMAL LOW (ref 98–111)
Creatinine, Ser: 0.65 mg/dL (ref 0.61–1.24)
GFR, Estimated: >60 mL/min (ref >60)
Glucose, Bld: 124 mg/dL — ABNORMAL HIGH (ref 70–99)
Potassium: 4.2 mmol/L (ref 3.5–5.1)
Sodium: 131 mmol/L — ABNORMAL LOW (ref 135–145)
Total Bilirubin: 0.6 mg/dL (ref 0.3–1.2)
Total Protein: 6.1 g/dL — ABNORMAL LOW (ref 6.5–8.1)

## 2020-07-18 LAB — BLOOD GAS, VENOUS
Acid-Base Excess: 2.8 mmol/L — ABNORMAL HIGH (ref 0.0–2.0)
Bicarbonate: 27.3 mmol/L (ref 20.0–28.0)
O2 Saturation: 32.9 %
Patient temperature: 98.6
pCO2, Ven: 44.5 mmHg (ref 44.0–60.0)
pH, Ven: 7.405 (ref 7.250–7.430)
pO2, Ven: <31 mmHg — CL (ref 32.0–45.0)

## 2020-07-18 MED ORDER — SODIUM CHLORIDE 0.9 % IV BOLUS
500.0000 mL | Freq: Once | INTRAVENOUS | Status: AC
  Administered 2020-07-18: 500 mL via INTRAVENOUS

## 2020-07-18 NOTE — ED Provider Notes (Signed)
[facility]-EMERGENCY DEPT Provider Note   CSN: [id number] Arrival date & time: 07/18/20  1205     History Chief Complaint  Patient presents with  . Diarrhea    Drew [name] is a 75 y.o. male.  HPI Patient with known gastric adenocarcinoma, being treated with chemotherapy, last dose 5 days ago, presents with concern for diarrhea, not responsive to Lomotil which he is taking.  Also reported confusion and weakness.  Patient presents by EMS, no one came with him.  Patient reports to me that he has been weak and having intermittent diarrhea for 2 weeks.  He denies fever, chills, blood in stool, abdominal pain, focal weakness or paresthesia.  He is taking Lomotil, sporadically at home.  There are no other known modifying factors.    Past Medical History:  Diagnosis Date  . ED (erectile dysfunction)   . gastric ca dx'd 12/2019  . GERD (gastroesophageal reflux disease)   . High cholesterol   . Hypertension   . Hypogonadism male   . Prediabetes   . PUD (peptic ulcer disease)     Patient Active Problem List   Diagnosis Date Noted  . GIB (gastrointestinal bleeding) 06/02/2020  . Port-A-Cath in place 01/31/2020  . Abdominal distention   . Palliative care by specialist   . DNR (do not resuscitate)   . DNR (do not resuscitate) discussion   . Advanced care planning/counseling discussion   . Goals of care, counseling/discussion 01/26/2020  . Sepsis (HCC) 01/22/2020  . CVA (cerebral vascular accident) (HCC) 01/20/2020  . Gastric outlet obstruction 01/20/2020  . Pulmonary embolism (HCC) 01/20/2020  . Severe protein-calorie malnutrition (HCC) 01/20/2020  . Gastric adenocarcinoma (HCC) 01/17/2020  . Iron deficiency 01/06/2020  . Acute blood loss anemia 01/03/2020  . Acute upper GI bleed 01/03/2020  . Metastasis (HCC) 01/03/2020  . Pleural effusion, right 01/03/2020    Past Surgical History:  Procedure Laterality Date  . DUODENAL STENT PLACEMENT N/A  06/04/2020   Procedure: DUODENAL STENT PLACEMENT;  Surgeon: Vida Rigger, MD;  Location: [facility];  Service: Endoscopy;  Laterality: N/A;  . ESOPHAGOGASTRODUODENOSCOPY (EGD) WITH PROPOFOL N/A 06/04/2020   Procedure: ESOPHAGOGASTRODUODENOSCOPY (EGD) WITH PROPOFOL and possible stenting with fluoroscopy;  Surgeon: Vida Rigger, MD;  Location: [facility];  Service: Endoscopy;  Laterality: N/A;  . FINGER AMPUTATION    . TONSILLECTOMY         Family History  Problem Relation Age of Onset  . Diabetes Other   . Prostate cancer Brother        both brothers    Social History   Tobacco Use  . Smoking status: Former Games developer  . Smokeless tobacco: Never Used  Vaping Use  . Vaping Use: Never used  Substance Use Topics  . Alcohol use: Yes    Alcohol/week: 10.0 standard drinks    Types: 10 Glasses of wine per week  . Drug use: No    Home Medications Prior to Admission medications   Medication Sig Start Date End Date Taking? Authorizing Provider  acetaminophen (TYLENOL) 500 MG tablet Take 1,000 mg by mouth every 6 (six) hours as needed for mild pain.    [provider]  baclofen (LIORESAL) 10 MG tablet Take 1 tablet (10 mg total) by mouth 3 (three) times daily as needed for muscle spasms. Patient not taking: Reported on 07/12/2020 06/01/20   Ladene Artist, MD  dexamethasone (DECADRON) 2 MG tablet Take 5 tablets (10 mg total) by mouth as directed. Patient not  taking: Reported on 07/12/2020 06/19/20   Ladell Pier, MD  diphenoxylate-atropine (LOMOTIL) 2.5-0.025 MG tablet TAKE 1-2 TABLETS BY MOUTH FOUR TIMES DAILY AS NEEDED FOR DIARHEA OR LOOSE STOOLS Patient taking differently: Take 1 tablet by mouth daily as needed for diarrhea or loose stools. 05/02/20   Ladell Pier, MD  FEROSUL 325 (65 Fe) MG tablet Take 1 tablet (325 mg total) by mouth daily. 04/10/20   Ladell Pier, MD  HYDROcodone-acetaminophen (NORCO/VICODIN) 5-325 MG tablet Take 1 tablet by mouth every 6 (six)  hours as needed for moderate pain.  Patient not taking: No sig reported 01/20/20   [provider]  lidocaine-prilocaine (EMLA) cream Apply 1 application topically as needed. Patient taking differently: Apply 1 application topically as needed (For numbing). 01/27/20   Ladell Pier, MD  magnesium oxide (MAG-OX) 400 (241.3 Mg) MG tablet Take 1 tablet (400 mg total) by mouth 2 (two) times daily. 05/08/20   Owens Shark, NP  metoprolol succinate (TOPROL-XL) 25 MG 24 hr tablet Take 1 tablet (25 mg total) by mouth daily. Patient not taking: Reported on 07/12/2020 05/08/20   Owens Shark, NP  Omega-3 Fatty Acids (FISH OIL) 1000 MG CAPS Take 2 capsules by mouth daily.    [provider]  pantoprazole (PROTONIX) 40 MG tablet Take 1 tablet (40 mg total) by mouth 2 (two) times daily. 05/08/20   Owens Shark, NP  polyethylene glycol (MIRALAX / GLYCOLAX) 17 g packet Take 17 g by mouth daily as needed. Patient not taking: No sig reported 01/29/20   Antonieta Pert, MD  potassium chloride SA (KLOR-CON) 20 MEQ tablet TAKE 1 TABLET(20 MEQ) BY MOUTH DAILY Patient taking differently: Take 20 mEq by mouth daily. 04/24/20   Ladell Pier, MD  prochlorperazine (COMPAZINE) 10 MG tablet Take 1 tablet (10 mg total) by mouth every 6 (six) hours as needed for nausea or vomiting. Patient not taking: Reported on 07/12/2020 04/10/20   Ladell Pier, MD  tamsulosin (FLOMAX) 0.4 MG CAPS capsule TAKE 1 CAPSULE(0.4 MG) BY MOUTH DAILY Patient taking differently: Take 0.4 mg by mouth daily. 04/24/20   Ladell Pier, MD    Allergies    Atorvastatin and Pravastatin  Review of Systems   Review of Systems  All other systems reviewed and are negative.   Physical Exam Updated Vital Signs BP 132/69   Pulse 79   Temp 97.6 F (36.4 C) (Oral)   Resp 16   SpO2 100%   Physical Exam Vitals and nursing note reviewed.  Constitutional:      General: He is not in acute distress.    Appearance: He is  well-developed and well-nourished. He is not ill-appearing, toxic-appearing or diaphoretic.  HENT:     Head: Normocephalic and atraumatic.     Right Ear: External ear normal.     Left Ear: External ear normal.     Mouth/Throat:     Mouth: Mucous membranes are moist.     Pharynx: No oropharyngeal exudate or posterior oropharyngeal erythema.  Eyes:     Extraocular Movements: EOM normal.     Conjunctiva/sclera: Conjunctivae normal.     Pupils: Pupils are equal, round, and reactive to light.  Neck:     Trachea: Phonation normal.  Cardiovascular:     Rate and Rhythm: Normal rate and regular rhythm.     Heart sounds: Normal heart sounds.  Pulmonary:     Effort: Pulmonary effort is normal.     Breath  sounds: Normal breath sounds.  Chest:     Chest wall: No bony tenderness.  Abdominal:     General: There is no distension.     Palpations: Abdomen is soft.     Tenderness: There is no abdominal tenderness.  Musculoskeletal:        General: Normal range of motion.     Cervical back: Normal range of motion and neck supple.  Skin:    General: Skin is warm, dry and intact.  Neurological:     Mental Status: He is alert and oriented to person, place, and time.     Cranial Nerves: No cranial nerve deficit.     Sensory: No sensory deficit.     Motor: No abnormal muscle tone.     Coordination: Coordination normal.  Psychiatric:        Mood and Affect: Mood and affect and mood normal.        Behavior: Behavior normal.        Thought Content: Thought content normal.        Judgment: Judgment normal.     ED Results / Procedures / Treatments   Labs (all labs ordered are listed, but only abnormal results are displayed) Labs Reviewed  COMPREHENSIVE METABOLIC PANEL - Abnormal; Notable for the following components:      Result Value   Sodium 131 (*)    Chloride 97 (*)    Glucose, Bld 124 (*)    Calcium 8.3 (*)    Total Protein 6.1 (*)    Albumin 2.9 (*)    All other components within  normal limits  CBC WITH DIFFERENTIAL/PLATELET - Abnormal; Notable for the following components:   RBC 2.82 (*)    Hemoglobin 7.8 (*)    HCT 25.4 (*)    RDW 16.3 (*)    Lymphs Abs 0.6 (*)    All other components within normal limits  BLOOD GAS, VENOUS - Abnormal; Notable for the following components:   pO2, Ven <31.0 (*)    Acid-Base Excess 2.8 (*)    All other components within normal limits    EKG None  Radiology No results found.  Procedures Procedures (including critical care time)  Medications Ordered in ED Medications  sodium chloride 0.9 % bolus 500 mL (0 mLs Intravenous Stopped 07/18/20 1404)    ED Course  I have reviewed the triage vital signs and the nursing notes.  Pertinent labs & imaging results that were available during my care of the patient were reviewed by me and considered in my medical decision making (see chart for details).    MDM Rules/Calculators/A&P                           Patient Vitals for the past 24 hrs:  BP Temp Temp src Pulse Resp SpO2  07/18/20 1500 132/69 - - 79 16 100 %  07/18/20 1400 (!) 105/92 - - 98 16 99 %  07/18/20 1330 136/73 - - 79 - 100 %  07/18/20 1300 130/67 - - 79 16 100 %  07/18/20 1218 118/71 97.6 F (36.4 C) Oral 81 16 99 %    3:22 PM Reevaluation with update and discussion. After initial assessment and treatment, an updated evaluation reveals he states he feels "better."  I discussed using Lomotil more frequently for diarrhea.  He is agreeable to try that.Mancel Bale   Medical Decision Making:  This patient is presenting for evaluation of diarrhea likely  associated with gastric cancer, and or chemotherapy, which does require a range of treatment options, and is a complaint that involves a high risk of morbidity and mortality. The differential diagnoses include noninfectious diarrhea, infectious diarrhea, metabolic disorder, hemodynamic compromise. I decided to review old records, and in summary elderly male,  being treated for cancer, with likely secondary diarrhea.  I did not require additional historical information from anyone.  Clinical Laboratory Tests Ordered, included CBC, Metabolic panel and VBG. Review indicates normal except sodium 1, [place], glucose high, calcium low, total protein low, albumin low, hemoglobin low.   Critical Interventions-clinical evaluation, IV fluids, laboratory testing, observation reassessment  After These Interventions, the Patient was reevaluated and was found patient comfortable after treatment.  Findings discussed and questions answered.  He is stable for discharge.  He has ongoing care scheduled by oncology.  I have instructed him to call oncology for follow-up appointment in couple of days he may need additional IV fluids at that time.  CRITICAL CARE-no Performed by: Daleen Bo  Nursing Notes Reviewed/ Care Coordinated Applicable Imaging Reviewed Interpretation of Laboratory Data incorporated into ED treatment  The patient appears reasonably screened and/or stabilized for discharge and I doubt any other medical condition or other [facility] requiring further screening, evaluation, or treatment in the ED at this time prior to discharge.  Plan: Home Medications-continue usual, use Lomotil up to 8/day; Home Treatments-rest, fluids, eat regularly; return here if the recommended treatment, does not improve the symptoms; Recommended follow up-PCP and oncology,.     Final Clinical Impression(s) / ED Diagnoses Final diagnoses:  Diarrhea, unspecified type    Rx / DC Orders ED Discharge Orders    None       Daleen Bo, MD 07/18/20 1526

## 2020-07-18 NOTE — ED Notes (Signed)
Date and time results received: 07/18/20 1:19 PM   Test: O2 on VBG Critical Value: < 31.0  Name of Provider Notified: WENTZ,MD

## 2020-07-18 NOTE — Telephone Encounter (Signed)
VM from daughter that he is having diarrhea despite Lomotil and she thinks he is dehydrated and needs IV fluids. Called patient and he repeats he had #2 loose stools yesterday and #3 today. Reports it is not a large amount. Took Lomotil X 2 yesterday and #1 today. He is not nauseated and able to drink fluids. Informed him he is not taking enough Lomotil--needs to take #1 after each loose stool and push fluids by drinking 8 ounces every 1-2 hours. As this RN was talking with patient, EMS personnel got on phone and reported he is en-route to emergency room at daughter's request.

## 2020-07-18 NOTE — ED Triage Notes (Signed)
EMS reports from home, diarrhea off and on for 2 weeks, Pt states no blood, no abdominal pain. Hx of Gastric CA. Last treatment last Thursday. Nurse called Pt enroute in ambulance and stated he may be taking his diarrhea Med incorrectly, supposed to take two pills after each episode, Pt only taking it two times per day.  BP 138/86 HR 88 RR 18 Sp02 100 RA Temp 97

## 2020-07-18 NOTE — Telephone Encounter (Signed)
Received call from Drew [name]'s daughter Drew [name] reporting that Drew [name] has had loose stools for several days (per Drew [name]'s wife) but now he is experiencing sever lethargy and confusion.  Denies falls or recent injury or fever/chills.  Drew [name] is in [place] and is coordinating with her mother (Drew [name]'s spouse) on Drew [name]'s care.  Spoke with MD Truett Perna who recommends Drew [name] go via EMS to ER for evaluation.  Drew [name] agreed, states she will call EMS and her father to discuss Dr. Kalman Drape recommendation.  She is aware to call back as needed with any further questions/concerns.

## 2020-07-18 NOTE — Discharge Instructions (Addendum)
The testing does not show any serious problems.  It is safe to use the Lomotil, 1 after each loose stool, up to 8/day.  Call Dr. Alcide Evener for a follow-up appointment in a couple of days.  Try to drink lots of water and get some calories in by eating, as much as you can.

## 2020-07-19 ENCOUNTER — Telehealth: Payer: Self-pay | Admitting: *Deleted

## 2020-07-19 NOTE — Telephone Encounter (Signed)
Called to f/u on status of patient. Daughter reports he only had one loose stool last night and today and has taken Lomotil after each. Drinking fluids and eating.

## 2020-07-25 ENCOUNTER — Other Ambulatory Visit: Payer: Self-pay

## 2020-07-25 ENCOUNTER — Ambulatory Visit (HOSPITAL_COMMUNITY)
Admission: RE | Admit: 2020-07-25 | Discharge: 2020-07-25 | Disposition: A | Discharge status: Home / Self Care | Payer: Medicare PPO | Source: Ambulatory Visit | Attending: Oncology | Admitting: Oncology

## 2020-07-25 DIAGNOSIS — R270 Ataxia, unspecified: Secondary | ICD-10-CM | POA: Diagnosis not present

## 2020-07-25 DIAGNOSIS — C169 Malignant neoplasm of stomach, unspecified: Secondary | ICD-10-CM | POA: Diagnosis not present

## 2020-07-25 MED ORDER — GADOBUTROL 1 MMOL/ML IV SOLN
7.0000 mL | Freq: Once | INTRAVENOUS | Status: AC | PRN
  Administered 2020-07-25: 7 mL via INTRAVENOUS

## 2020-07-26 ENCOUNTER — Inpatient Hospital Stay: Payer: Medicare PPO

## 2020-07-26 ENCOUNTER — Other Ambulatory Visit: Payer: Self-pay | Admitting: Oncology

## 2020-07-26 ENCOUNTER — Inpatient Hospital Stay (HOSPITAL_BASED_OUTPATIENT_CLINIC_OR_DEPARTMENT_OTHER): Payer: Medicare PPO | Admitting: Nurse Practitioner

## 2020-07-26 ENCOUNTER — Inpatient Hospital Stay: Payer: Medicare PPO | Attending: Oncology

## 2020-07-26 ENCOUNTER — Other Ambulatory Visit: Payer: Self-pay

## 2020-07-26 VITALS — BP 124/71 | HR 76 | Temp 97.9°F | Resp 18 | Ht 66.0 in | Wt 144.0 lb

## 2020-07-26 VITALS — BP 130/68 | HR 68 | Resp 17

## 2020-07-26 DIAGNOSIS — C169 Malignant neoplasm of stomach, unspecified: Secondary | ICD-10-CM | POA: Diagnosis not present

## 2020-07-26 DIAGNOSIS — T451X5A Adverse effect of antineoplastic and immunosuppressive drugs, initial encounter: Secondary | ICD-10-CM | POA: Insufficient documentation

## 2020-07-26 DIAGNOSIS — G620 Drug-induced polyneuropathy: Secondary | ICD-10-CM | POA: Diagnosis not present

## 2020-07-26 DIAGNOSIS — R197 Diarrhea, unspecified: Secondary | ICD-10-CM | POA: Diagnosis not present

## 2020-07-26 DIAGNOSIS — D649 Anemia, unspecified: Secondary | ICD-10-CM | POA: Insufficient documentation

## 2020-07-26 DIAGNOSIS — G629 Polyneuropathy, unspecified: Secondary | ICD-10-CM | POA: Diagnosis not present

## 2020-07-26 DIAGNOSIS — Z79899 Other long term (current) drug therapy: Secondary | ICD-10-CM | POA: Insufficient documentation

## 2020-07-26 DIAGNOSIS — R269 Unspecified abnormalities of gait and mobility: Secondary | ICD-10-CM | POA: Diagnosis not present

## 2020-07-26 DIAGNOSIS — C787 Secondary malignant neoplasm of liver and intrahepatic bile duct: Secondary | ICD-10-CM | POA: Insufficient documentation

## 2020-07-26 DIAGNOSIS — Z95828 Presence of other vascular implants and grafts: Secondary | ICD-10-CM

## 2020-07-26 DIAGNOSIS — Z5112 Encounter for antineoplastic immunotherapy: Secondary | ICD-10-CM | POA: Insufficient documentation

## 2020-07-26 DIAGNOSIS — Z86711 Personal history of pulmonary embolism: Secondary | ICD-10-CM | POA: Diagnosis not present

## 2020-07-26 LAB — CMP (CANCER CENTER ONLY)
ALT: 8 U/L (ref 0–44)
AST: 14 U/L — ABNORMAL LOW (ref 15–41)
Albumin: 2.6 g/dL — ABNORMAL LOW (ref 3.5–5.0)
Alkaline Phosphatase: 85 U/L (ref 38–126)
Anion gap: 7 (ref 5–15)
BUN: 9 mg/dL (ref 8–23)
CO2: 26 mmol/L (ref 22–32)
Calcium: 9.1 mg/dL (ref 8.9–10.3)
Chloride: 103 mmol/L (ref 98–111)
Creatinine: 0.69 mg/dL (ref 0.61–1.24)
GFR, Estimated: >60 mL/min (ref >60)
Glucose, Bld: 171 mg/dL — ABNORMAL HIGH (ref 70–99)
Potassium: 4.5 mmol/L (ref 3.5–5.1)
Sodium: 136 mmol/L (ref 135–145)
Total Bilirubin: 0.3 mg/dL (ref 0.3–1.2)
Total Protein: 6.6 g/dL (ref 6.5–8.1)

## 2020-07-26 LAB — CBC WITH DIFFERENTIAL (CANCER CENTER ONLY)
Abs Immature Granulocytes: 0.02 10*3/uL (ref 0.00–0.07)
Basophils Absolute: 0 10*3/uL (ref 0.0–0.1)
Basophils Relative: 0 %
Eosinophils Absolute: 0 10*3/uL (ref 0.0–0.5)
Eosinophils Relative: 0 %
HCT: 27.4 % — ABNORMAL LOW (ref 39.0–52.0)
Hemoglobin: 8.6 g/dL — ABNORMAL LOW (ref 13.0–17.0)
Immature Granulocytes: 1 %
Lymphocytes Relative: 10 %
Lymphs Abs: 0.4 10*3/uL — ABNORMAL LOW (ref 0.7–4.0)
MCH: 27.2 pg (ref 26.0–34.0)
MCHC: 31.4 g/dL (ref 30.0–36.0)
MCV: 86.7 fL (ref 80.0–100.0)
Monocytes Absolute: 0 10*3/uL — ABNORMAL LOW (ref 0.1–1.0)
Monocytes Relative: 1 %
Neutro Abs: 3.2 10*3/uL (ref 1.7–7.7)
Neutrophils Relative %: 88 %
Platelet Count: 311 10*3/uL (ref 150–400)
RBC: 3.16 MIL/uL — ABNORMAL LOW (ref 4.22–5.81)
RDW: 16.7 % — ABNORMAL HIGH (ref 11.5–15.5)
WBC Count: 3.7 10*3/uL — ABNORMAL LOW (ref 4.0–10.5)
nRBC: 0 % (ref 0.0–0.2)

## 2020-07-26 LAB — SAMPLE TO BLOOD BANK

## 2020-07-26 LAB — TOTAL PROTEIN, URINE DIPSTICK: Protein, ur: NEGATIVE mg/dL — AB

## 2020-07-26 MED ORDER — SODIUM CHLORIDE 0.9% FLUSH
10.0000 mL | Freq: Once | INTRAVENOUS | Status: AC
  Administered 2020-07-26: 10 mL
  Filled 2020-07-26: qty 10

## 2020-07-26 MED ORDER — FAMOTIDINE IN NACL 20-0.9 MG/50ML-% IV SOLN
INTRAVENOUS | Status: AC
  Filled 2020-07-26: qty 50

## 2020-07-26 MED ORDER — HEPARIN SOD (PORK) LOCK FLUSH 100 UNIT/ML IV SOLN
500.0000 [IU] | Freq: Once | INTRAVENOUS | Status: AC | PRN
  Administered 2020-07-26: 500 [IU]
  Filled 2020-07-26: qty 5

## 2020-07-26 MED ORDER — SODIUM CHLORIDE 0.9 % IV SOLN
Freq: Once | INTRAVENOUS | Status: AC
  Filled 2020-07-26: qty 250

## 2020-07-26 MED ORDER — DIPHENHYDRAMINE HCL 50 MG/ML IJ SOLN
25.0000 mg | Freq: Once | INTRAMUSCULAR | Status: AC
  Administered 2020-07-26: 25 mg via INTRAVENOUS

## 2020-07-26 MED ORDER — DIPHENHYDRAMINE HCL 50 MG/ML IJ SOLN
INTRAMUSCULAR | Status: AC
  Filled 2020-07-26: qty 1

## 2020-07-26 MED ORDER — SODIUM CHLORIDE 0.9% FLUSH
10.0000 mL | INTRAVENOUS | Status: DC | PRN
  Administered 2020-07-26: 10 mL
  Filled 2020-07-26: qty 10

## 2020-07-26 MED ORDER — FAMOTIDINE IN NACL 20-0.9 MG/50ML-% IV SOLN
20.0000 mg | Freq: Once | INTRAVENOUS | Status: AC
  Administered 2020-07-26: 20 mg via INTRAVENOUS

## 2020-07-26 MED ORDER — SODIUM CHLORIDE 0.9 % IV SOLN
8.0000 mg/kg | Freq: Once | INTRAVENOUS | Status: AC
  Administered 2020-07-26: 500 mg via INTRAVENOUS
  Filled 2020-07-26: qty 50

## 2020-07-26 NOTE — Patient Instructions (Signed)
[facility] Discharge Instructions for Patients Receiving Chemotherapy  Today you received the following chemotherapy agents Cyramza  To help prevent nausea and vomiting after your treatment, we encourage you to take your nausea medication as directed.    If you develop nausea and vomiting that is not controlled by your nausea medication, call the clinic.   BELOW ARE SYMPTOMS THAT SHOULD BE REPORTED IMMEDIATELY:  *FEVER GREATER THAN 100.5 F  *CHILLS WITH OR WITHOUT FEVER  NAUSEA AND VOMITING THAT IS NOT CONTROLLED WITH YOUR NAUSEA MEDICATION  *UNUSUAL SHORTNESS OF BREATH  *UNUSUAL BRUISING OR BLEEDING  TENDERNESS IN MOUTH AND THROAT WITH OR WITHOUT PRESENCE OF ULCERS  *URINARY PROBLEMS  *BOWEL PROBLEMS  UNUSUAL RASH Items with * indicate a potential emergency and should be followed up as soon as possible.  Feel free to call the clinic should you have any questions or concerns. The clinic phone number is [phone].  Please show the CHEMO ALERT CARD at check-in to the Emergency Department and triage nurse.  Ramucirumab injection What is this medicine? RAMUCIRUMAB (ra mue SIR ue mab) is a monoclonal antibody. It is used to treat stomach cancer, colorectal cancer, liver cancer, and lung cancer. This medicine may be used for other purposes; ask your health care provider or pharmacist if you have questions. COMMON BRAND NAME(S): Cyramza What should I tell my health care provider before I take this medicine? They need to know if you have any of these conditions:  bleeding disorders  blood clots  heart disease, including heart failure, heart attack, or chest pain (angina)  high blood pressure  infection (especially a virus infection such as chickenpox, cold sores, or herpes)  protein in your urine  recent or planning to have surgery  stroke  an unusual or allergic reaction to ramucirumab, other medicines, foods, dyes, or preservatives  pregnant  or trying to get pregnant  breast-feeding How should I use this medicine? This medicine is for infusion into a vein. It is given by a health care professional in a hospital or clinic setting. Talk to your pediatrician regarding the use of this medicine in children. Special care may be needed. Overdosage: If you think you have taken too much of this medicine contact a poison control center or emergency room at once. NOTE: This medicine is only for you. Do not share this medicine with others. What if I miss a dose? It is important not to miss your dose. Call your doctor or health care professional if you are unable to keep an appointment. What may interact with this medicine? Interactions have not been studied. This list may not describe all possible interactions. Give your health care provider a list of all the medicines, herbs, non-prescription drugs, or dietary supplements you use. Also tell them if you smoke, drink alcohol, or use illegal drugs. Some items may interact with your medicine. What should I watch for while using this medicine? Your condition will be monitored carefully while you are receiving this medicine. You will need to to check your blood pressure and have your blood and urine tested while you are taking this medicine. Your condition will be monitored carefully while you are receiving this medicine. This medicine may increase your risk to bruise or bleed. Call your doctor or health care professional if you notice any unusual bleeding. Before having surgery, talk to your health care provider to make sure it is ok. This drug can increase the risk of poor healing of your surgical   site or wound. You will need to stop this drug for 28 days before surgery. After surgery, wait at least 2 weeks before restarting this drug. Make sure the surgical site or wound is healed enough before restarting this drug. Talk to your health care provider if questions. Do not become pregnant while taking  this medicine or for 3 months after stopping it. Women should inform their doctor if they wish to become pregnant or think they might be pregnant. There is a potential for serious side effects to an unborn child. Talk to your health care professional or pharmacist for more information. Do not breast-feed an infant while taking this medicine or for 2 months after stopping it. This medicine may interfere with the ability to have a child. Talk with your doctor or health care professional if you are concerned about your fertility. What side effects may I notice from receiving this medicine? Side effects that you should report to your doctor or health care professional as soon as possible:  allergic reactions like skin rash, itching or hives, breathing problems, swelling of the face, lips, or tongue  signs of infection - fever or chills, cough, sore throat  chest pain or chest tightness  confusion  dizziness  feeling faint or lightheaded, falls  severe abdominal pain  severe nausea, vomiting  signs and symptoms of bleeding such as bloody or black, tarry stools; red or dark-brown urine; spitting up blood or brown material that looks like coffee grounds; red spots on the skin; unusual bruising or bleeding from the eye, gums, or nose  signs and symptoms of a blood clot such as breathing problems; changes in vision; chest pain; severe, sudden headache; pain, swelling, warmth in the leg; trouble speaking; sudden numbness or weakness of the face, arm or leg  symptoms of a stroke: change in mental awareness, inability to talk or move one side of the body  trouble walking, dizziness, loss of balance or coordination Side effects that usually do not require medical attention (report to your doctor or health care professional if they continue or are bothersome):  cold, clammy skin  constipation  diarrhea  headache  nausea, vomiting  stomach pain  unusually slow heartbeat  unusually weak  or tired This list may not describe all possible side effects. Call your doctor for medical advice about side effects. You may report side effects to FDA at 1-800-FDA-1088. Where should I keep my medicine? This drug is given in a hospital or clinic and will not be stored at home. NOTE: This sheet is a summary. It may not cover all possible information. If you have questions about this medicine, talk to your doctor, pharmacist, or health care provider.  2021 Elsevier/Gold Standard (2019-04-27 11:17:50)  

## 2020-07-26 NOTE — Progress Notes (Addendum)
[place] OFFICE PROGRESS NOTE   Diagnosis: Gastric cancer  INTERVAL HISTORY:   Mr. Drew [name] returns as scheduled.  He completed cycle two Taxol/ramucirumab 07/13/2020. He was seen in the emergency department 07/18/2020 with diarrhea. He received IV fluids. He was feeling better, increased use of Lomotil discussed. He was discharged home.  Drew [name] accompanies him to today's appointment. She reports Drew mobility continues to deteriorate. He has a "shuffling" gait. He reports the numbness in Drew fingers and feet has increased. He feels this is affecting Drew ability to walk.  Drew [name] reports he is unable to button Drew shirt or tie Drew shoes.  He denies any falls. He continues to have a runny nose, hypersalivation. He has intermittent diarrhea. No nausea or vomiting. He denies bleeding.  He urinates frequently during the night.  He has noted improvement since beginning Flomax.  Objective:  Vital signs in last 24 hours:  Blood pressure 124/71, pulse 76, temperature 97.9 F (36.6 C), temperature source Tympanic, resp. rate 18, height _0  (1.676 m), weight 144 lb (65.3 kg), SpO2 100 %.    HEENT: No thrush or ulcers.  Pupils equal round and reactive to light. Resp: Lungs clear bilaterally. Cardio: Regular rate and rhythm. GI: No hepatomegaly. Vascular: No leg edema. Neuro: Alert and oriented.  Follows commands.  Moves all extremities.  Port-A-Cath without erythema.  Lab Results:  Lab Results  Component Value Date   WBC 3.7 (L) 07/26/2020   HGB 8.6 (L) 07/26/2020   HCT 27.4 (L) 07/26/2020   MCV 86.7 07/26/2020   PLT 311 07/26/2020   NEUTROABS 3.2 07/26/2020    Imaging:  MR Brain W Wo Contrast  Result Date: 07/25/2020 CLINICAL DATA:  Gastric cancer. Gait ataxia. Evaluate for CNS metastases and stroke. EXAM: MRI HEAD WITHOUT AND WITH CONTRAST TECHNIQUE: Multiplanar, multiecho pulse sequences of the brain and surrounding structures were obtained without and  with intravenous contrast. CONTRAST:  22m GADAVIST GADOBUTROL 1 MMOL/ML IV SOLN COMPARISON:  None. FINDINGS: Brain: No acute infarction, hemorrhage, hydrocephalus, extra-axial collection or mass lesion. Scattered and confluent foci of T2 hyperintensity are seen within the white matter of the cerebral hemispheres and within the pons, nonspecific, most likely related to chronic microvascular ischemic changes. 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 No focus of abnormal contrast enhancement. Vascular: Normal flow voids. Skull and upper cervical spine: Normal marrow signal. Sinuses/Orbits: The left maxillary sinus is filled with content that is heterogeneous, predominantly hyperintense on T1 and hypointense on T2 this may represent inspissated secretion. However fungal infection cannot be excluded. The orbits are maintained. IMPRESSION: 1. No evidence of intracranial metastatic disease. 2. Advanced chronic microvascular ischemic changes. 3. 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 4. Left maxillary sinus disease with likely inspissated secretion versus fungal infection. Electronically Signed   By: KPedro EarlsM.D.   On: 07/25/2020 18:14    Medications: I have reviewed the patient's current medications.  Assessment/Plan: 1. 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  CT abdomen/pelvis at [facility] 01/03/2020-masslike wall thickening of the gastric antrum, numerous liver metastases, small volume ascites, numerous peritoneal nodules, enlarged periaortic and porta hepatis lymph nodes  Upper endoscopy 01/06/2020-large ulcer in the gastric antrum with heaped up and firm edges, Duodenal bulb largely "obliterated"by the mass, biopsy confirmed adenocarcinoma, HER-2 3+, intact mismatch repair protein expression, PD-L1: 4   CT chest 01/10/2020-acute bilateral pulmonary emboli, no lymphadenopathy or suspicious lung nodules  Baseline echo  01/17/20  at [place], [place] 60-65%  CT abdomen/pelvis 01/22/2020, gastric mass, liver metastases, peritoneal/omental nodules, ascites, right pleural effusion  Therapeutic and diagnostic paracentesis on 01/27/2020, 2.1 L removed;cytology shows malignant cells,malignant ascites  PD-L1combined positive score: 1 (from ascites/periteoneal fluid), reported 02/01/20   Cycle 1 FOLFOX with trastuzumab and pembrolizumab, 01/31/2020  Baseline CEA 13.6 (02/08/2020)  Cycle 2 FOLFOX/trastuzumab 02/15/20  Pembrolizumab 02/22/2020  Cycle 3 FOLFOX/trastuzumab 02/28/2020  Cycle 4 FOLFOX/trastuzumab 03/14/2020  Pembrolizumab 03/14/2020  CTs 03/27/2020-multifocal liver metastases again noted, appear mildly decreased in size in the interval. Persistent diffuse peritoneal disease with mild decrease in size of index peritoneal lesions. Similar volume of upper abdominal ascites. Small right pleural effusion is decreased.  Cycle 5 FOLFOX/trastuzumab 03/28/2020  Pembrolizumab 9/22/202  Cycle 6 FOLFOX/trastuzumab 04/10/2020  Pembrolizumab 04/24/2020  Cycle 7 FOLFOX/trastuzumab 04/24/2020  Cycle 8 FOLFOX/trastuzumab 05/08/2020  Pembrolizumab 05/15/2020  CTs 05/21/2020-mildly worsened omental caking of tumor. Mild perihepatic ascites. Marked irregular gastric wall thickening in the gastric antrum traversed by gastric stent.  Cycle95-FU/trastuzumab 05/22/2020  Upper endoscopy 06/04/2020-moderately severe esophagitis; large, fungating polypoid and ulcerated circumferential mass in the gastric antrum, no bleeding/recent bleeding; obvious stenosis of stent with significant tumor ingrowth; stent placed  Cycle 1 Taxol/ramucirumab 06/18/2020  Cycle 2 Taxol/ramucirumab 07/13/2020  MRI brain 07/25/2020-no metastatic disease.  Advanced chronic microvascular ischemic changes.  Remote lacunar infarct right cerebellar hemisphere.  Left maxillary sinus disease  Cycle 3 ramucirumab 07/26/2020 (Taxol held due to concern for  progressive neuropathy)  2.Nausea/vomiting secondary to #1 with gastric outlet obstruction  Gastric stent placement 01/12/2020, n/v resolved  Admission with intractable nausea/vomiting 06/02/2020-upper endoscopy 06/04/2000-tumor ingrowth in gastric antrum stent, new stent placed, gastric antrum mass  3.Acute bilateral lower lobe pulmonary emboli on CT 01/10/2020-treated with apixaban,(givenLovenoxduring hospitalization), apixaban discontinued during hospital admission November 2021  4.Anemia-likely secondary to bleeding from the gastric mass, chemotherapy, and chronic disease  5.Status post Port-A-Cath placement 01/13/2020  6.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 Hospital admission 06/02/2020-intractable nausea/vomiting secondary to tumor ingrowth at the gastric pylorus stent  12.  Abdominal pain and flushing during Taxol infusion 06/18/2020-resolved after receiving Benadryl, Pepcid and Solu-Medrol.  Taxol resumed and completed.  Dexamethasone home premedication added.   Disposition: Drew [name] appears unchanged.  He has completed 2 cycles of Taxol/ramucirumab.  He continues to have a gait disturbance and today reports increased numbness in the hands and feet.  He was previously referred to neurology and has an appointment tomorrow.  We decided to hold Taxol from today's treatment and again in 2 weeks.  It is unclear if  neuropathy is causing the gait disturbance.  We discussed the brain MRI report with Drew [name] and Drew [name].  There is no evidence of metastatic disease.  We reviewed the CBC from today.  Counts adequate to proceed with treatment.  He will return for lab, follow-up, ramucirumab in 2 weeks.  We are available to see him sooner if needed.  Patient seen with Dr. Benay Spice.    Ned Card ANP/GNP-BC   07/26/2020  11:43 AM This was a shared  visit with Ned Card.  Drew [name] was interviewed and examined.  We reviewed the brain MRI findings with Drew [name] and Drew [name].  There is no MRI explanation for Drew neurologic symptoms.  He is scheduled to see neurology tomorrow.  It is possible the gait disturbance is in part related to peripheral neuropathy.  The peripheral numbness may be secondary to previous treatment with oxaliplatin, or the current Taxol chemotherapy.  We placed Taxol on hold. Drew [name] will continue ramucirumab.  He will return for an office visit and ramucirumab in 2 weeks.  We will plan for a restaging CT abdomen in approximately 1 month.  I performed the medical decision making associated with today's visit.  Julieanne Manson, MD

## 2020-07-26 NOTE — Progress Notes (Signed)
Plan for Cyramza only today and day 15 per Ned Card, NP.  Pt has neuropathy w/ Taxol. Have discussed plan for premeds and adj tx plan accordingly for C2D1 and C2D15. Planning to resume Taxol w/ C3 if neuropathy improves.  Kennith Center, Pharm.D., CPP 07/26/2020@12 :25 PM

## 2020-07-27 ENCOUNTER — Other Ambulatory Visit: Payer: Medicare PPO

## 2020-07-27 ENCOUNTER — Telehealth: Payer: Self-pay | Admitting: Nurse Practitioner

## 2020-07-27 ENCOUNTER — Encounter: Payer: Self-pay | Admitting: Nurse Practitioner

## 2020-07-27 ENCOUNTER — Other Ambulatory Visit: Payer: Self-pay | Admitting: *Deleted

## 2020-07-27 ENCOUNTER — Encounter: Payer: Self-pay | Admitting: Neurology

## 2020-07-27 ENCOUNTER — Ambulatory Visit (INDEPENDENT_AMBULATORY_CARE_PROVIDER_SITE_OTHER): Payer: Medicare PPO | Admitting: Neurology

## 2020-07-27 ENCOUNTER — Other Ambulatory Visit: Payer: Self-pay | Admitting: Neurology

## 2020-07-27 VITALS — BP 125/70 | HR 72 | Ht 67.0 in | Wt 146.0 lb

## 2020-07-27 DIAGNOSIS — G2119 Other drug induced secondary parkinsonism: Secondary | ICD-10-CM

## 2020-07-27 DIAGNOSIS — C169 Malignant neoplasm of stomach, unspecified: Secondary | ICD-10-CM

## 2020-07-27 DIAGNOSIS — I6389 Other cerebral infarction: Secondary | ICD-10-CM | POA: Diagnosis not present

## 2020-07-27 MED ORDER — POTASSIUM CHLORIDE CRYS ER 20 MEQ PO TBCR: EXTENDED_RELEASE_TABLET | ORAL | 0 refills | Status: DC

## 2020-07-27 MED ORDER — TAMSULOSIN HCL 0.4 MG PO CAPS: ORAL_CAPSULE | ORAL | 0 refills | Status: DC

## 2020-07-27 MED ORDER — CARBIDOPA-LEVODOPA 25-100 MG PO TABS: ORAL_TABLET | ORAL | 0 refills | Status: DC

## 2020-07-27 MED ORDER — PROCHLORPERAZINE MALEATE 10 MG PO TABS: 10.0000 mg | ORAL_TABLET | Freq: Four times a day (QID) | ORAL | 2 refills | Status: DC | PRN

## 2020-07-27 NOTE — Progress Notes (Signed)
Daughter called to f/u on refills. Noted that 2 script yesterday did not transmit to pharmacy. Resent today successfully.

## 2020-07-27 NOTE — Telephone Encounter (Signed)
Scheduled appointments per 1/13 los. Spoke to patient's daughter who is aware of appointments dates and times.

## 2020-07-27 NOTE — Progress Notes (Signed)
NEUROLOGY FOLLOW UP OFFICE NOTE  EDELMIRO NIEMI HO:[id number]   Subjective:  Drew [name] is a 75 year old right-handed male with metastatic gastric adenomcarcinoma, HTN, high cholesterol, prediabetes and PUD who follows up for stroke.  He is accompanied by his daughter who supplements history.  UPDATE: Anticoagulation was discontinued due to bleeding related to the stent.  Statin was discontinued due to leg pain.    He has been on chemotherapy since July - Taxol/ramucirumab.  He has had a steady decline since then.  He endorses numbness and tingling in the feet and hands.  The numbness in the hands causes difficulty with dexterity such as buttoning his shirt.  He is more unsteady on his feet.  He shuffles his feet.  He drools and speech is marbled.  Denies difficulty swallowing.  May have a tremor but that is reportedly infrequent. One year ago, he was playing basketball.  Labs from December include B12 1,177, TSH 0.474.  MRI of brain on 07/25/2020 showed advanced chronic small vessel ischemic changes with remote lacunar infarct in the right cerebellar hemsiphere (seen on prior MRI from June 2021) but no acute stroke or metastasis.  Labs from yesterday showed CBC with WBC 3.7, HGB 8.6, HCT 27.4, PLT 311 and CMP with Na 136, K 4.5, Cl 103, CO2 26, glucose 171, BUN 9, Cr 0.69, Ca 9.1, t bili 0.3, ALP 85, AST 14, ALT 8, GFR >60.  HISTORY: He was admitted to [facility] on 01/04/2020 progressive epigastric pain with intractable nausea and vomiting and upper GI bleed.  He was subsequently diagnosed with gastric outlet obstruction due to a gastric adenocarcinoma with metastatic disease to the liver and lymph nodes and was also found to have bilateral pulmonary emboli.  MRI of brain with and without contrast performed to evaluate for metastasis found multiple small acute/subacute infarcts in the bilateral corona radiata with possible small foci in the bilateral frontal corona radiata which may  represent metastasis.  Neurology was consulted.  TEE was not performed due to risk of procedure and instead cardiolog recommended TTE with bubble study, which showed EF 60-65% without cardiac source of embolus.  Carotid US showed no hemodynamically significant stenosis.  Hgb A1c was 5.8.  He was discharged on Eliquis.  MRI BRAIN W WO:  IMPRESSION: 1. Multiple small acute or subacute infarctions in the bilateral corona radiata, including the centrum semiovale, and one in the right peritrigonal white matter. 2. Very small foci of mild enhancement in the bilateral frontal corona radiata (at least 3 on each side), of indeterminate etiology. These could be due to enhancement of subacute infarctions. The appearance is not typical for metastases, although these could conceivably still be metastases, including the possibility of metastatic disease within perivascular spaces, although there is no appreciable leptomeningeal enhancement elsewhere. Correlation with CSF studies, including cytology, and short interval follow-up MRI without and with IV contrast may be of benefit.  3. Old small lacunar infarctions and moderate leukoaraiosis, including in the pons.  Current medications:  Eliquis, atorvastatin 10mg , Toprol-XL   PAST MEDICAL HISTORY: Past Medical History:  Diagnosis Date  . ED (erectile dysfunction)   . gastric ca dx'd 12/2019  . GERD (gastroesophageal reflux disease)   . High cholesterol   . Hypertension   . Hypogonadism male   . Prediabetes   . PUD (peptic ulcer disease)     MEDICATIONS: Current Outpatient Medications on File Prior to Visit  Medication Sig Dispense Refill  . acetaminophen (TYLENOL)  500 MG tablet Take 1,000 mg by mouth every 6 (six) hours as needed for mild pain.    . baclofen (LIORESAL) 10 MG tablet Take 1 tablet (10 mg total) by mouth 3 (three) times daily as needed for muscle spasms. (Patient not taking: Reported on 07/12/2020) 30 each 2  . dexamethasone (DECADRON) 2 MG  tablet Take 5 tablets (10 mg total) by mouth as directed. (Patient not taking: Reported on 07/12/2020) 40 tablet 3  . diphenoxylate-atropine (LOMOTIL) 2.5-0.025 MG tablet TAKE 1-2 TABLETS BY MOUTH FOUR TIMES DAILY AS NEEDED FOR DIARHEA OR LOOSE STOOLS (Patient taking differently: Take 1 tablet by mouth daily as needed for diarrhea or loose stools.) 60 tablet 0  . FEROSUL 325 (65 Fe) MG tablet Take 1 tablet (325 mg total) by mouth daily. 30 tablet 2  . HYDROcodone-acetaminophen (NORCO/VICODIN) 5-325 MG tablet Take 1 tablet by mouth every 6 (six) hours as needed for moderate pain.  (Patient not taking: No sig reported)    . lidocaine-prilocaine (EMLA) cream Apply 1 application topically as needed. (Patient taking differently: Apply 1 application topically as needed (For numbing).) 30 g 2  . magnesium oxide (MAG-OX) 400 (241.3 Mg) MG tablet Take 1 tablet (400 mg total) by mouth 2 (two) times daily. 180 tablet 0  . metoprolol succinate (TOPROL-XL) 25 MG 24 hr tablet Take 1 tablet (25 mg total) by mouth daily. (Patient not taking: Reported on 07/12/2020) 90 tablet 0  . Omega-3 Fatty Acids (FISH OIL) 1000 MG CAPS Take 2 capsules by mouth daily.    . pantoprazole (PROTONIX) 40 MG tablet Take 1 tablet (40 mg total) by mouth 2 (two) times daily. 180 tablet 0  . polyethylene glycol (MIRALAX / GLYCOLAX) 17 g packet Take 17 g by mouth daily as needed. (Patient not taking: No sig reported) 14 each 0  . potassium chloride SA (KLOR-CON) 20 MEQ tablet TAKE 1 TABLET(20 MEQ) BY MOUTH DAILY 90 tablet 0  . prochlorperazine (COMPAZINE) 10 MG tablet TAKE 1 TABLET(10 MG) BY MOUTH EVERY 6 HOURS AS NEEDED FOR NAUSEA OR VOMITING 60 tablet 2  . tamsulosin (FLOMAX) 0.4 MG CAPS capsule TAKE 1 CAPSULE(0.4 MG) BY MOUTH DAILY (Patient taking differently: Take 0.4 mg by mouth daily.) 90 capsule 0   No current facility-administered medications on file prior to visit.    ALLERGIES: Allergies  Allergen Reactions  . Atorvastatin  Other (See Comments)  . Pravastatin Other (See Comments)    FAMILY HISTORY: Family History  Problem Relation Age of Onset  . Diabetes Other   . Prostate cancer Brother        both brothers    SOCIAL HISTORY: Social History   Socioeconomic History  . Marital status: Married    Spouse name: Not on file  . Number of children: Not on file  . Years of education: Not on file  . Highest education level: Not on file  Occupational History  . Not on file  Tobacco Use  . Smoking status: Former Research scientist (life sciences)  . Smokeless tobacco: Never Used  Vaping Use  . Vaping Use: Never used  Substance and Sexual Activity  . Alcohol use: Yes    Alcohol/week: 10.0 standard drinks    Types: 10 Glasses of wine per week  . Drug use: No  . Sexual activity: Not on file  Other Topics Concern  . Not on file  Social History Narrative  . Not on file   Social Determinants of Health   Financial Resource Strain: Not on  file  Food Insecurity: Not on file  Transportation Needs: Not on file  Physical Activity: Not on file  Stress: Not on file  Social Connections: Not on file  Intimate Partner Violence: Not on file     Objective:  Blood pressure 125/70, pulse 72, height 5\' 7"  (1.702 m), weight 146 lb (66.2 kg), SpO2 98 %. General: No acute distress.  Patient appears well-groomed.   Head:  Normocephalic/atraumatic Eyes:  Fundi examined but not visualized Neck: supple, no paraspinal tenderness, full range of motion Heart:  Regular rate and rhythm Lungs:  Clear to auscultation bilaterally Back: No paraspinal tenderness Neurological Exam: alert and oriented to person, place, and time. Speech hypophonic but fluent and not dysarthric, language intact.  Hypomimia with drooling.  Difficulty with tracking and reduced upward gaze.  Otherwise, CN II-XII intact. Mildly increased tone in right wrist; muscle strength 5/5 throughout.  Bradykinesia including decreased finger-thumb tapping speed and amplitude.  No tremor.   Sensation to pinprick intact; vibratory sensation reduced in feet.  Deep tendon reflexes 2+ throughout, toes downgoing.  Finger to nose testing slow but without dysmetria.  Uses upper extremities to push up from wheelchair to stand.  Freezing of gait upon initiation of walking followed by shuffling gait.   Assessment/Plan:   1.  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  Other less likely secondary causes may be vascular (he has extensive chronic small vessel disease but appears to spare the basal ganglia) or paraneoplastic syndrome. 2.  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  Gastric adenocarcinoma 3.  Bilateral pulmonary emboli 4.  Gastric adenocarcinoma  1.  We will try a carbidopa-levodopa trial: 25/100mg  titrating to 1 tablet three times daily 2.  Will check serum paraneoplastic panel. 3.  Follow up in 3 months.  Metta Clines, DO  CC:  Gaynelle Arabian, MD

## 2020-07-27 NOTE — Patient Instructions (Addendum)
  1. Start carbidopa (Sinemet) 25/100mg  tablets.  Day 1-3: Take 0.5 tablet in morning and 0.5 tablet in evening/bedtime.  Day 4-7: Take 0.5 tablet in morning, 0.5 tablet in afternoon, and 0.5 tablet in evening/bedtime.  Week 2 & thereafter: Take 1 tablet three times daily.   Take the medication at the same time everyday. The medication does not get absorbed into your body as well, if you take it with protein-containing foods (meat, dairy, beans). Try taking the medication about one hour before meals. If you experience nausea by taking it on an empty stomach, you may take it with carbohydrate-containing food,such as bread.  Side effects to look out for, include dizziness, nausea, vivid dreams and hallucinations. If you experience any of these symptoms, please call us.

## 2020-08-01 ENCOUNTER — Other Ambulatory Visit: Payer: Medicare PPO

## 2020-08-01 ENCOUNTER — Other Ambulatory Visit: Payer: Self-pay

## 2020-08-01 DIAGNOSIS — R140 Abdominal distension (gaseous): Secondary | ICD-10-CM | POA: Diagnosis not present

## 2020-08-01 DIAGNOSIS — G219 Secondary parkinsonism, unspecified: Secondary | ICD-10-CM | POA: Diagnosis not present

## 2020-08-01 DIAGNOSIS — C169 Malignant neoplasm of stomach, unspecified: Secondary | ICD-10-CM | POA: Diagnosis not present

## 2020-08-01 DIAGNOSIS — I639 Cerebral infarction, unspecified: Secondary | ICD-10-CM | POA: Diagnosis not present

## 2020-08-01 DIAGNOSIS — K311 Adult hypertrophic pyloric stenosis: Secondary | ICD-10-CM | POA: Diagnosis not present

## 2020-08-01 DIAGNOSIS — I2699 Other pulmonary embolism without acute cor pulmonale: Secondary | ICD-10-CM | POA: Diagnosis not present

## 2020-08-06 ENCOUNTER — Telehealth: Payer: Self-pay

## 2020-08-06 NOTE — Telephone Encounter (Signed)
Returned call to daughter. Answered question regarding per Benay Spice MD it is ok for pt to use a suppository.

## 2020-08-08 ENCOUNTER — Other Ambulatory Visit: Payer: Self-pay | Admitting: *Deleted

## 2020-08-08 DIAGNOSIS — C169 Malignant neoplasm of stomach, unspecified: Secondary | ICD-10-CM

## 2020-08-10 ENCOUNTER — Other Ambulatory Visit: Payer: Self-pay

## 2020-08-10 ENCOUNTER — Inpatient Hospital Stay: Payer: Medicare PPO

## 2020-08-10 ENCOUNTER — Other Ambulatory Visit: Payer: Self-pay | Admitting: Neurology

## 2020-08-10 ENCOUNTER — Inpatient Hospital Stay (HOSPITAL_BASED_OUTPATIENT_CLINIC_OR_DEPARTMENT_OTHER): Payer: Medicare PPO | Admitting: Nurse Practitioner

## 2020-08-10 ENCOUNTER — Encounter: Payer: Self-pay | Admitting: Nurse Practitioner

## 2020-08-10 VITALS — BP 130/75 | HR 71 | Temp 97.8°F | Resp 18 | Ht 67.0 in | Wt 141.0 lb

## 2020-08-10 VITALS — BP 144/81 | HR 70 | Temp 97.8°F | Resp 16

## 2020-08-10 DIAGNOSIS — C169 Malignant neoplasm of stomach, unspecified: Secondary | ICD-10-CM

## 2020-08-10 DIAGNOSIS — T451X5A Adverse effect of antineoplastic and immunosuppressive drugs, initial encounter: Secondary | ICD-10-CM | POA: Diagnosis not present

## 2020-08-10 DIAGNOSIS — Z95828 Presence of other vascular implants and grafts: Secondary | ICD-10-CM

## 2020-08-10 DIAGNOSIS — G629 Polyneuropathy, unspecified: Secondary | ICD-10-CM | POA: Diagnosis not present

## 2020-08-10 DIAGNOSIS — D649 Anemia, unspecified: Secondary | ICD-10-CM | POA: Diagnosis not present

## 2020-08-10 DIAGNOSIS — R269 Unspecified abnormalities of gait and mobility: Secondary | ICD-10-CM | POA: Diagnosis not present

## 2020-08-10 DIAGNOSIS — Z5112 Encounter for antineoplastic immunotherapy: Secondary | ICD-10-CM | POA: Diagnosis not present

## 2020-08-10 DIAGNOSIS — R197 Diarrhea, unspecified: Secondary | ICD-10-CM | POA: Diagnosis not present

## 2020-08-10 DIAGNOSIS — C787 Secondary malignant neoplasm of liver and intrahepatic bile duct: Secondary | ICD-10-CM | POA: Diagnosis not present

## 2020-08-10 DIAGNOSIS — G620 Drug-induced polyneuropathy: Secondary | ICD-10-CM | POA: Diagnosis not present

## 2020-08-10 LAB — CMP (CANCER CENTER ONLY)
ALT: 24 U/L (ref 0–44)
AST: 27 U/L (ref 15–41)
Albumin: 2.7 g/dL — ABNORMAL LOW (ref 3.5–5.0)
Alkaline Phosphatase: 122 U/L (ref 38–126)
Anion gap: 9 (ref 5–15)
BUN: 12 mg/dL (ref 8–23)
CO2: 24 mmol/L (ref 22–32)
Calcium: 9 mg/dL (ref 8.9–10.3)
Chloride: 100 mmol/L (ref 98–111)
Creatinine: 0.67 mg/dL (ref 0.61–1.24)
GFR, Estimated: >60 mL/min (ref >60)
Glucose, Bld: 186 mg/dL — ABNORMAL HIGH (ref 70–99)
Potassium: 4.4 mmol/L (ref 3.5–5.1)
Sodium: 133 mmol/L — ABNORMAL LOW (ref 135–145)
Total Bilirubin: 0.2 mg/dL — ABNORMAL LOW (ref 0.3–1.2)
Total Protein: 6.7 g/dL (ref 6.5–8.1)

## 2020-08-10 LAB — CBC WITH DIFFERENTIAL (CANCER CENTER ONLY)
Abs Immature Granulocytes: 0.03 10*3/uL (ref 0.00–0.07)
Basophils Absolute: 0 10*3/uL (ref 0.0–0.1)
Basophils Relative: 0 %
Eosinophils Absolute: 0 10*3/uL (ref 0.0–0.5)
Eosinophils Relative: 0 %
HCT: 28 % — ABNORMAL LOW (ref 39.0–52.0)
Hemoglobin: 8.6 g/dL — ABNORMAL LOW (ref 13.0–17.0)
Immature Granulocytes: 1 %
Lymphocytes Relative: 9 %
Lymphs Abs: 0.5 10*3/uL — ABNORMAL LOW (ref 0.7–4.0)
MCH: 26.6 pg (ref 26.0–34.0)
MCHC: 30.7 g/dL (ref 30.0–36.0)
MCV: 86.7 fL (ref 80.0–100.0)
Monocytes Absolute: 0.1 10*3/uL (ref 0.1–1.0)
Monocytes Relative: 2 %
Neutro Abs: 4.8 10*3/uL (ref 1.7–7.7)
Neutrophils Relative %: 88 %
Platelet Count: 277 10*3/uL (ref 150–400)
RBC: 3.23 MIL/uL — ABNORMAL LOW (ref 4.22–5.81)
RDW: 16.8 % — ABNORMAL HIGH (ref 11.5–15.5)
WBC Count: 5.4 10*3/uL (ref 4.0–10.5)
nRBC: 0 % (ref 0.0–0.2)

## 2020-08-10 LAB — SAMPLE TO BLOOD BANK

## 2020-08-10 MED ORDER — HEPARIN SOD (PORK) LOCK FLUSH 100 UNIT/ML IV SOLN
500.0000 [IU] | Freq: Once | INTRAVENOUS | Status: AC | PRN
  Administered 2020-08-10: 500 [IU]
  Filled 2020-08-10: qty 5

## 2020-08-10 MED ORDER — DIPHENHYDRAMINE HCL 50 MG/ML IJ SOLN
INTRAMUSCULAR | Status: AC
  Filled 2020-08-10: qty 1

## 2020-08-10 MED ORDER — SODIUM CHLORIDE 0.9 % IV SOLN
Freq: Once | INTRAVENOUS | Status: AC
  Filled 2020-08-10: qty 250

## 2020-08-10 MED ORDER — FAMOTIDINE IN NACL 20-0.9 MG/50ML-% IV SOLN
20.0000 mg | Freq: Once | INTRAVENOUS | Status: AC
  Administered 2020-08-10: 20 mg via INTRAVENOUS

## 2020-08-10 MED ORDER — DIPHENHYDRAMINE HCL 50 MG/ML IJ SOLN
25.0000 mg | Freq: Once | INTRAMUSCULAR | Status: AC
  Administered 2020-08-10: 25 mg via INTRAVENOUS

## 2020-08-10 MED ORDER — SODIUM CHLORIDE 0.9% FLUSH
10.0000 mL | Freq: Once | INTRAVENOUS | Status: AC
Start: 2020-08-10 — End: 2020-08-10
  Administered 2020-08-10: 10 mL
  Filled 2020-08-10: qty 10

## 2020-08-10 MED ORDER — FAMOTIDINE IN NACL 20-0.9 MG/50ML-% IV SOLN
INTRAVENOUS | Status: AC
  Filled 2020-08-10: qty 50

## 2020-08-10 MED ORDER — SODIUM CHLORIDE 0.9 % IV SOLN
8.0000 mg/kg | Freq: Once | INTRAVENOUS | Status: AC
  Administered 2020-08-10: 500 mg via INTRAVENOUS
  Filled 2020-08-10: qty 50

## 2020-08-10 MED ORDER — SODIUM CHLORIDE 0.9% FLUSH
10.0000 mL | INTRAVENOUS | Status: DC | PRN
  Administered 2020-08-10: 10 mL
  Filled 2020-08-10: qty 10

## 2020-08-10 NOTE — Patient Instructions (Addendum)
[facility] Discharge Instructions for Patients Receiving Chemotherapy  Today you received the following chemotherapy agent: Ramucirumab (Cyramza)  To help prevent nausea and vomiting after your treatment, we encourage you to take your nausea medication as directed by your MD.   If you develop nausea and vomiting that is not controlled by your nausea medication, call the clinic.   BELOW ARE SYMPTOMS THAT SHOULD BE REPORTED IMMEDIATELY:  *FEVER GREATER THAN 100.5 F  *CHILLS WITH OR WITHOUT FEVER  NAUSEA AND VOMITING THAT IS NOT CONTROLLED WITH YOUR NAUSEA MEDICATION  *UNUSUAL SHORTNESS OF BREATH  *UNUSUAL BRUISING OR BLEEDING  TENDERNESS IN MOUTH AND THROAT WITH OR WITHOUT PRESENCE OF ULCERS  *URINARY PROBLEMS  *BOWEL PROBLEMS  UNUSUAL RASH Items with * indicate a potential emergency and should be followed up as soon as possible.  Feel free to call the clinic should you have any questions or concerns. The clinic phone number is [phone].  Please show the [place] at check-in to the Emergency Department and triage nurse.

## 2020-08-10 NOTE — Progress Notes (Signed)
[place] OFFICE PROGRESS NOTE   Diagnosis: Gastric cancer  INTERVAL HISTORY:   Mr. Knauff returns as scheduled.  He completed a cycle of ramucirumab 05/26/2021.  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  Has occasional vomiting.  Bowels alternate constipation/diarrhea.  He reports stable numbness/tingling in the hands and feet.  No fever, cough, shortness of breath.  No bleeding.  He is now using a walker.  His daughter notes that his gait is "a little better" at times.  He had some abdominal pain earlier today.  Objective:  Vital signs in last 24 hours:  Blood pressure 130/75, pulse 71, temperature 97.8 F (36.6 C), temperature source Tympanic, resp. rate 18, height _0  (1.702 m), weight 141 lb (64 kg), SpO2 100 %.    HEENT: Mild white coating over tongue.  No thrush. Resp: Lungs clear bilaterally. Cardio: Regular rate and rhythm. GI: Firm fullness right upper abdomen.  Nontender. Vascular: No leg edema. Neuro: Flat affect.  Shuffling gait.  Follows commands.  Port-A-Cath without erythema.   Lab Results:  Lab Results  Component Value Date   WBC 5.4 08/10/2020   HGB 8.6 (L) 08/10/2020   HCT 28.0 (L) 08/10/2020   MCV 86.7 08/10/2020   PLT 277 08/10/2020   NEUTROABS 4.8 08/10/2020    Imaging:  No results found.  Medications: I have reviewed the patient's current medications.  Assessment/Plan: 1. 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  CT abdomen[facility]pelvis at [facility] 01/03/2020-masslike wall thickening of the gastric antrum, numerous liver metastases, small volume ascites, numerous peritoneal nodules, enlarged periaortic and porta hepatis lymph nodes  Upper endoscopy 01/06/2020-large ulcer in the gastric antrum with heaped up and firm edges, Duodenal bulb largely "obliterated"by the mass, biopsy confirmed adenocarcinoma, HER-2 3+, intact mismatch repair protein expression, PD-L1: 4   CT chest 01/10/2020-acute  bilateral pulmonary emboli, no lymphadenopathy or suspicious lung nodules  Baseline echo 01/17/20 at [place], EF 60-65%  CT abdomen/pelvis 01/22/2020, gastric mass, liver metastases, peritoneal/omental nodules, ascites, right pleural effusion  Therapeutic and diagnostic paracentesis on 01/27/2020, 2.1 L removed;cytology shows malignant cells,malignant ascites  PD-L1combined positive score: 1 (from ascites/periteoneal fluid), reported 02/01/20   Cycle 1 FOLFOX with trastuzumab and pembrolizumab, 01/31/2020  Baseline CEA 13.6 (02/08/2020)  Cycle 2 FOLFOX/trastuzumab 02/15/20  Pembrolizumab 02/22/2020  Cycle 3 FOLFOX/trastuzumab 02/28/2020  Cycle 4 FOLFOX/trastuzumab 03/14/2020  Pembrolizumab 03/14/2020  CTs 03/27/2020-multifocal liver metastases again noted, appear mildly decreased in size in the interval. Persistent diffuse peritoneal disease with mild decrease in size of index peritoneal lesions. Similar volume of upper abdominal ascites. Small right pleural effusion is decreased.  Cycle 5 FOLFOX/trastuzumab 03/28/2020  Pembrolizumab 9/22/202  Cycle 6 FOLFOX/trastuzumab 04/10/2020  Pembrolizumab 04/24/2020  Cycle 7 FOLFOX/trastuzumab 04/24/2020  Cycle 8 FOLFOX/trastuzumab 05/08/2020  Pembrolizumab 05/15/2020  CTs 05/21/2020-mildly worsened omental caking of tumor. Mild perihepatic ascites. Marked irregular gastric wall thickening in the gastric antrum traversed by gastric stent.  Cycle95-FU/trastuzumab 05/22/2020  Upper endoscopy 06/04/2020-moderately severe esophagitis; large, fungating polypoid and ulcerated circumferential mass in the gastric antrum, no bleeding/recent bleeding; obvious stenosis of stent with significant tumor ingrowth; stent placed  Cycle 1 Taxol/ramucirumab 06/18/2020  Cycle 2 Taxol/ramucirumab 07/13/2020  MRI brain 07/25/2020-no metastatic disease.  Advanced chronic microvascular ischemic changes.  Remote lacunar infarct right cerebellar hemisphere.  Left  maxillary sinus disease  Cycle 3 ramucirumab 07/26/2020 (Taxol held due to concern for progressive neuropathy)  Cycle 4 ramucirumab 08/10/2020 (Taxol held)  2.Nausea/vomiting secondary to #1 with gastric outlet obstruction  Gastric  stent placement 01/12/2020, n/v resolved  Admission with intractable nausea/vomiting 06/02/2020-upper endoscopy 06/04/2000-tumor ingrowth in gastric antrum stent, new stent placed, gastric antrum mass  3.Acute bilateral lower lobe pulmonary emboli on CT 01/10/2020-treated with apixaban,(givenLovenoxduring hospitalization), apixaban discontinued during hospital admission November 2021  4.Anemia-likely secondary to bleeding from the gastric mass, chemotherapy, and chronic disease  5.Status post Port-A-Cath placement 01/13/2020  6.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 Hospital admission 06/02/2020-intractable nausea/vomiting secondary to tumor ingrowth at the gastric pylorus stent  12. Abdominal pain and flushing during Taxol infusion 06/18/2020-resolved after receiving Benadryl, Pepcid and Solu-Medrol. Taxol resumed and completed. Dexamethasone home premedication added.  13.  Status post evaluation by Dr. 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   Disposition: Mr. Cuevas appears unchanged.  He completed a cycle of ramucirumab 2 weeks  ago.  Taxol was held due to concern for progressive peripheral neuropathy.  Plan to proceed with ramucirumab today, continue to hold Taxol.  Restaging CTs prior to next visit.  We reviewed the CBC from today.  Counts adequate to proceed with treatment.  Hemoglobin is stable.  Dr. Benay Spice and I previously discussed Dr. Georgie Chard findings.  Dr. Gearldine Shown recommendation is to continue ramucirumab alone.  We will return for lab, follow-up, ramucirumab in 2 weeks, CTs the day prior.    Ned Card ANP/GNP-BC   08/10/2020  10:37 AM

## 2020-08-13 ENCOUNTER — Telehealth: Payer: Self-pay | Admitting: Nurse Practitioner

## 2020-08-13 NOTE — Telephone Encounter (Signed)
Scheduled appointments per 1/28 los. Spoke to patient's daughter who is aware of appointments dates and times.

## 2020-08-19 ENCOUNTER — Other Ambulatory Visit: Payer: Self-pay | Admitting: Oncology

## 2020-08-22 ENCOUNTER — Telehealth: Payer: Self-pay

## 2020-08-22 NOTE — Telephone Encounter (Signed)
Nutrition  Called patient mobile number for nutrition follow-up but no answer or option to leave voicemail as mailbox full.  Called home number and phone was picked up then hung up.   Will attempt to contact at later date.  Reeshemah Nazaryan B. Zenia Resides, [place], [place] Registered Dietitian [phone] (mobile)

## 2020-08-23 ENCOUNTER — Encounter (HOSPITAL_COMMUNITY): Payer: Self-pay

## 2020-08-23 ENCOUNTER — Other Ambulatory Visit: Payer: Self-pay

## 2020-08-23 ENCOUNTER — Ambulatory Visit (HOSPITAL_COMMUNITY)
Admission: RE | Admit: 2020-08-23 | Discharge: 2020-08-23 | Disposition: A | Discharge status: Home / Self Care | Payer: Medicare PPO | Source: Ambulatory Visit | Attending: Nurse Practitioner | Admitting: Nurse Practitioner

## 2020-08-23 DIAGNOSIS — K3189 Other diseases of stomach and duodenum: Secondary | ICD-10-CM | POA: Diagnosis not present

## 2020-08-23 DIAGNOSIS — K7689 Other specified diseases of liver: Secondary | ICD-10-CM | POA: Diagnosis not present

## 2020-08-23 DIAGNOSIS — C800 Disseminated malignant neoplasm, unspecified: Secondary | ICD-10-CM | POA: Diagnosis not present

## 2020-08-23 DIAGNOSIS — C169 Malignant neoplasm of stomach, unspecified: Secondary | ICD-10-CM | POA: Diagnosis not present

## 2020-08-23 MED ORDER — IOHEXOL 300 MG/ML  SOLN
100.0000 mL | Freq: Once | INTRAMUSCULAR | Status: AC | PRN
  Administered 2020-08-23: 80 mL via INTRAVENOUS

## 2020-08-24 ENCOUNTER — Inpatient Hospital Stay: Payer: Medicare PPO

## 2020-08-24 ENCOUNTER — Inpatient Hospital Stay (HOSPITAL_BASED_OUTPATIENT_CLINIC_OR_DEPARTMENT_OTHER): Payer: Medicare PPO | Admitting: Oncology

## 2020-08-24 ENCOUNTER — Telehealth: Payer: Self-pay | Admitting: *Deleted

## 2020-08-24 ENCOUNTER — Inpatient Hospital Stay: Payer: Medicare PPO | Attending: Oncology

## 2020-08-24 ENCOUNTER — Other Ambulatory Visit: Payer: Self-pay

## 2020-08-24 VITALS — BP 121/74 | HR 97 | Temp 97.8°F | Resp 18 | Ht 67.0 in | Wt 133.4 lb

## 2020-08-24 DIAGNOSIS — G20 Parkinson's disease: Secondary | ICD-10-CM | POA: Diagnosis not present

## 2020-08-24 DIAGNOSIS — Z5112 Encounter for antineoplastic immunotherapy: Secondary | ICD-10-CM | POA: Diagnosis present

## 2020-08-24 DIAGNOSIS — C169 Malignant neoplasm of stomach, unspecified: Secondary | ICD-10-CM

## 2020-08-24 DIAGNOSIS — C787 Secondary malignant neoplasm of liver and intrahepatic bile duct: Secondary | ICD-10-CM | POA: Insufficient documentation

## 2020-08-24 DIAGNOSIS — Z8673 Personal history of transient ischemic attack (TIA), and cerebral infarction without residual deficits: Secondary | ICD-10-CM | POA: Diagnosis not present

## 2020-08-24 DIAGNOSIS — Z86711 Personal history of pulmonary embolism: Secondary | ICD-10-CM | POA: Insufficient documentation

## 2020-08-24 DIAGNOSIS — Z79899 Other long term (current) drug therapy: Secondary | ICD-10-CM | POA: Insufficient documentation

## 2020-08-24 DIAGNOSIS — Z7901 Long term (current) use of anticoagulants: Secondary | ICD-10-CM | POA: Insufficient documentation

## 2020-08-24 LAB — TOTAL PROTEIN, URINE DIPSTICK: Protein, ur: 30 mg/dL — AB

## 2020-08-24 LAB — CMP (CANCER CENTER ONLY)
ALT: 77 U/L — ABNORMAL HIGH (ref 0–44)
AST: 142 U/L — ABNORMAL HIGH (ref 15–41)
Albumin: 2.8 g/dL — ABNORMAL LOW (ref 3.5–5.0)
Alkaline Phosphatase: 404 U/L — ABNORMAL HIGH (ref 38–126)
Anion gap: 8 (ref 5–15)
BUN: 11 mg/dL (ref 8–23)
CO2: 26 mmol/L (ref 22–32)
Calcium: 8.8 mg/dL — ABNORMAL LOW (ref 8.9–10.3)
Chloride: 101 mmol/L (ref 98–111)
Creatinine: 0.72 mg/dL (ref 0.61–1.24)
GFR, Estimated: >60 mL/min (ref >60)
Glucose, Bld: 153 mg/dL — ABNORMAL HIGH (ref 70–99)
Potassium: 3.8 mmol/L (ref 3.5–5.1)
Sodium: 135 mmol/L (ref 135–145)
Total Bilirubin: 0.4 mg/dL (ref 0.3–1.2)
Total Protein: 6.3 g/dL — ABNORMAL LOW (ref 6.5–8.1)

## 2020-08-24 LAB — CBC WITH DIFFERENTIAL (CANCER CENTER ONLY)
Abs Immature Granulocytes: 0.01 10*3/uL (ref 0.00–0.07)
Basophils Absolute: 0 10*3/uL (ref 0.0–0.1)
Basophils Relative: 1 %
Eosinophils Absolute: 0.1 10*3/uL (ref 0.0–0.5)
Eosinophils Relative: 1 %
HCT: 25.1 % — ABNORMAL LOW (ref 39.0–52.0)
Hemoglobin: 7.7 g/dL — ABNORMAL LOW (ref 13.0–17.0)
Immature Granulocytes: 0 %
Lymphocytes Relative: 12 %
Lymphs Abs: 0.7 10*3/uL (ref 0.7–4.0)
MCH: 26.6 pg (ref 26.0–34.0)
MCHC: 30.7 g/dL (ref 30.0–36.0)
MCV: 86.9 fL (ref 80.0–100.0)
Monocytes Absolute: 0.4 10*3/uL (ref 0.1–1.0)
Monocytes Relative: 6 %
Neutro Abs: 5.1 10*3/uL (ref 1.7–7.7)
Neutrophils Relative %: 80 %
Platelet Count: 230 10*3/uL (ref 150–400)
RBC: 2.89 MIL/uL — ABNORMAL LOW (ref 4.22–5.81)
RDW: 17.8 % — ABNORMAL HIGH (ref 11.5–15.5)
WBC Count: 6.3 10*3/uL (ref 4.0–10.5)
nRBC: 0 % (ref 0.0–0.2)

## 2020-08-24 LAB — MAGNESIUM: Magnesium: 2.1 mg/dL (ref 1.7–2.4)

## 2020-08-24 MED ORDER — CARBIDOPA-LEVODOPA 25-100 MG PO TABS: 1.0000 | ORAL_TABLET | Freq: Three times a day (TID) | ORAL | 0 refills | Status: DC

## 2020-08-24 MED ORDER — HEPARIN SOD (PORK) LOCK FLUSH 100 UNIT/ML IV SOLN
500.0000 [IU] | Freq: Once | INTRAVENOUS | Status: AC
  Administered 2020-08-24: 500 [IU] via INTRAVENOUS
  Filled 2020-08-24: qty 5

## 2020-08-24 MED ORDER — SODIUM CHLORIDE 0.9% FLUSH
10.0000 mL | INTRAVENOUS | Status: DC | PRN
  Administered 2020-08-24: 10 mL via INTRAVENOUS
  Filled 2020-08-24: qty 10

## 2020-08-24 MED ORDER — APIXABAN 2.5 MG PO TABS: 2.5000 mg | ORAL_TABLET | Freq: Every day | ORAL | 0 refills | Status: DC

## 2020-08-24 NOTE — Progress Notes (Signed)
[place] OFFICE PROGRESS NOTE   Diagnosis: Gastric cancer  INTERVAL HISTORY:   Drew [name] returns for a scheduled visit.  He is here today with his daughter.  He was last treated with ramucirumab on 08/10/2020.  No new complaint.  The movement disorder has not improved with Sinemet.  No bleeding.  His appetite and energy level are diminished.  He has vomited intermittently.  Objective:  Vital signs in last 24 hours:  Blood pressure 121/74, pulse 97, temperature 97.8 F (36.6 C), temperature source Tympanic, resp. rate 18, height _0  (1.702 m), weight 133 lb 6.4 oz (60.5 kg), SpO2 100 %.    HEENT: No thrush Resp: Lungs clear bilaterally Cardio: Regular rate and rhythm GI: No hepatomegaly, no mass, nontender Vascular: No leg edema Neuro: Alert, ambulates to the exam table with a slow gait    Portacath/PICC-without erythema  Lab Results:  Lab Results  Component Value Date   WBC 6.3 08/24/2020   HGB 7.7 (L) 08/24/2020   HCT 25.1 (L) 08/24/2020   MCV 86.9 08/24/2020   PLT 230 08/24/2020   NEUTROABS 5.1 08/24/2020    CMP  Lab Results  Component Value Date   NA 135 08/24/2020   K 3.8 08/24/2020   CL 101 08/24/2020   CO2 26 08/24/2020   GLUCOSE 153 (H) 08/24/2020   BUN 11 08/24/2020   CREATININE 0.72 08/24/2020   CALCIUM 8.8 (L) 08/24/2020   PROT 6.3 (L) 08/24/2020   ALBUMIN 2.8 (L) 08/24/2020   AST 142 (H) 08/24/2020   ALT 77 (H) 08/24/2020   ALKPHOS 404 (H) 08/24/2020   BILITOT 0.4 08/24/2020   GFRNONAA >60 08/24/2020   GFRAA >60 04/10/2020    Lab Results  Component Value Date   CEA1 14.92 (H) 04/24/2020     Medications: I have reviewed the patient's current medications.   Assessment/Plan:  1. 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  CT abdomen[facility]pelvis at [facility] 01/03/2020-masslike wall thickening of the gastric antrum, numerous liver metastases, small volume ascites, numerous peritoneal nodules,  enlarged periaortic and porta hepatis lymph nodes  Upper endoscopy 01/06/2020-large ulcer in the gastric antrum with heaped up and firm edges, Duodenal bulb largely "obliterated"by the mass, biopsy confirmed adenocarcinoma, HER-2 3+, intact mismatch repair protein expression, PD-L1: 4   CT chest 01/10/2020-acute bilateral pulmonary emboli, no lymphadenopathy or suspicious lung nodules  Baseline echo 01/17/20 at [place], EF 60-65%  CT abdomen/pelvis 01/22/2020, gastric mass, liver metastases, peritoneal/omental nodules, ascites, right pleural effusion  Therapeutic and diagnostic paracentesis on 01/27/2020, 2.1 L removed;cytology shows malignant cells,malignant ascites  PD-L1combined positive score: 1 (from ascites/periteoneal fluid), reported 02/01/20   Cycle 1 FOLFOX with trastuzumab and pembrolizumab, 01/31/2020  Baseline CEA 13.6 (02/08/2020)  Cycle 2 FOLFOX/trastuzumab 02/15/20  Pembrolizumab 02/22/2020  Cycle 3 FOLFOX/trastuzumab 02/28/2020  Cycle 4 FOLFOX/trastuzumab 03/14/2020  Pembrolizumab 03/14/2020  CTs 03/27/2020-multifocal liver metastases again noted, appear mildly decreased in size in the interval. Persistent diffuse peritoneal disease with mild decrease in size of index peritoneal lesions. Similar volume of upper abdominal ascites. Small right pleural effusion is decreased.  Cycle 5 FOLFOX/trastuzumab 03/28/2020  Pembrolizumab 9/22/202  Cycle 6 FOLFOX/trastuzumab 04/10/2020  Pembrolizumab 04/24/2020  Cycle 7 FOLFOX/trastuzumab 04/24/2020  Cycle 8 FOLFOX/trastuzumab 05/08/2020  Pembrolizumab 05/15/2020  CTs 05/21/2020-mildly worsened omental caking of tumor. Mild perihepatic ascites. Marked irregular gastric wall thickening in the gastric antrum traversed by gastric stent.  Cycle95-FU/trastuzumab 05/22/2020  Upper endoscopy 06/04/2020-moderately severe esophagitis; large, fungating polypoid and ulcerated circumferential mass in  the gastric antrum, no  bleeding/recent bleeding; obvious stenosis of stent with significant tumor ingrowth; stent placed  Cycle 1 Taxol/ramucirumab 06/18/2020  Cycle 2 Taxol/ramucirumab 07/13/2020  MRI brain 07/25/2020-no metastatic disease.  Advanced chronic microvascular ischemic changes.  Remote lacunar infarct right cerebellar hemisphere.  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, new bibasilar pulmonary nodules, new bilateral common femoral DVTs  2.Nausea/vomiting secondary to #1 with gastric outlet obstruction  Gastric stent placement 01/12/2020, n/v resolved  Admission with intractable nausea/vomiting 06/02/2020-upper endoscopy 06/04/2000-tumor ingrowth in gastric antrum stent, new stent placed, gastric antrum mass  3.Acute bilateral lower lobe pulmonary emboli on CT 01/10/2020-treated with apixaban,(givenLovenoxduring hospitalization), apixaban discontinued during hospital admission November 2021  4.Anemia-likely secondary to bleeding from the gastric mass, chemotherapy, and chronic disease  5.Status post Port-A-Cath placement 01/13/2020  6.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 Hospital admission 06/02/2020-intractable nausea/vomiting secondary to tumor ingrowth at the gastric pylorus stent  12. Abdominal pain and flushing during Taxol infusion 06/18/2020-resolved after receiving Benadryl, Pepcid and Solu-Medrol. Taxol resumed and completed. Dexamethasone home premedication added.  13.  Status post evaluation by Dr. 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    Disposition: Drew [name] has metastatic gastric cancer.  He has developed weight loss and failure to thrive.  The restaging CT yesterday reveals evidence of disease progression in the liver, lungs, and abdominal lymph nodes.  He has been treated with several lines of systemic therapy.  There is a small chance of clinical benefit with further systemic therapy.  I recommend hospice care.  Drew [name] and his daughter are in agreement with a hospice referral.  He agrees to a no CODE BLUE status.  There are new femoral vein DVTs on the CT 08/23/2020.  I discussed the risk and benefit of anticoagulation therapy with Drew [name] and his daughter.  He would like to continue anticoagulation therapy, but he and his family are unable to give injections.  He understands the risk of bleeding with anticoagulation therapy.  We decided to begin dose adjusted apixaban.  Drew [name] will be referred to the [facility] home hospice program.  He will return for an office visit in 2 weeks.  Betsy Coder, MD  08/24/2020  10:25 AM

## 2020-08-24 NOTE — Patient Instructions (Signed)

## 2020-08-24 NOTE — Telephone Encounter (Signed)
Call to [facility] for admission to services. Dr. Benay Spice will be attending and he is DNR. Family already has DNR form at home. Please call daughter to arrange first visit.

## 2020-08-27 ENCOUNTER — Telehealth: Payer: Self-pay

## 2020-08-27 NOTE — Telephone Encounter (Signed)
Spoke with patient's daughter Magdalene River and scheduled an in-person Palliative Consult for 09/07/20 @ 1PM  COVID screening was negative. No pets in home. Patient lives with wife. Daughter will be at consult.  Consent obtained; updated Outlook/Netsmart/Team List and Epic.  Family is aware they will be receiving a call from NP the day before or day of to confirm appointment.

## 2020-08-29 ENCOUNTER — Encounter: Payer: Self-pay | Admitting: Oncology

## 2020-08-29 ENCOUNTER — Other Ambulatory Visit: Payer: Self-pay | Admitting: Oncology

## 2020-09-01 DIAGNOSIS — K311 Adult hypertrophic pyloric stenosis: Secondary | ICD-10-CM | POA: Diagnosis not present

## 2020-09-01 DIAGNOSIS — C169 Malignant neoplasm of stomach, unspecified: Secondary | ICD-10-CM | POA: Diagnosis not present

## 2020-09-01 DIAGNOSIS — I639 Cerebral infarction, unspecified: Secondary | ICD-10-CM | POA: Diagnosis not present

## 2020-09-01 DIAGNOSIS — I2699 Other pulmonary embolism without acute cor pulmonale: Secondary | ICD-10-CM | POA: Diagnosis not present

## 2020-09-01 DIAGNOSIS — R140 Abdominal distension (gaseous): Secondary | ICD-10-CM | POA: Diagnosis not present

## 2020-09-02 ENCOUNTER — Inpatient Hospital Stay (HOSPITAL_COMMUNITY): Payer: Medicare PPO

## 2020-09-02 ENCOUNTER — Emergency Department (HOSPITAL_COMMUNITY): Payer: Medicare PPO

## 2020-09-02 ENCOUNTER — Encounter (HOSPITAL_COMMUNITY): Payer: Self-pay | Admitting: Emergency Medicine

## 2020-09-02 ENCOUNTER — Inpatient Hospital Stay (HOSPITAL_COMMUNITY)
Admission: EM | Admit: 2020-09-02 | Discharge: 2020-09-05 | DRG: 811 | Disposition: A | Discharge status: Hospice | Payer: Medicare PPO | Attending: Internal Medicine | Admitting: Internal Medicine

## 2020-09-02 ENCOUNTER — Other Ambulatory Visit: Payer: Self-pay | Admitting: Oncology

## 2020-09-02 ENCOUNTER — Other Ambulatory Visit: Payer: Self-pay

## 2020-09-02 DIAGNOSIS — R54 Age-related physical debility: Secondary | ICD-10-CM | POA: Diagnosis present

## 2020-09-02 DIAGNOSIS — Z95828 Presence of other vascular implants and grafts: Secondary | ICD-10-CM

## 2020-09-02 DIAGNOSIS — C7800 Secondary malignant neoplasm of unspecified lung: Secondary | ICD-10-CM | POA: Diagnosis not present

## 2020-09-02 DIAGNOSIS — Z87891 Personal history of nicotine dependence: Secondary | ICD-10-CM

## 2020-09-02 DIAGNOSIS — R1110 Vomiting, unspecified: Secondary | ICD-10-CM | POA: Diagnosis not present

## 2020-09-02 DIAGNOSIS — C799 Secondary malignant neoplasm of unspecified site: Secondary | ICD-10-CM | POA: Diagnosis present

## 2020-09-02 DIAGNOSIS — Z20822 Contact with and (suspected) exposure to covid-19: Secondary | ICD-10-CM | POA: Diagnosis present

## 2020-09-02 DIAGNOSIS — R627 Adult failure to thrive: Secondary | ICD-10-CM | POA: Diagnosis not present

## 2020-09-02 DIAGNOSIS — K920 Hematemesis: Secondary | ICD-10-CM | POA: Diagnosis not present

## 2020-09-02 DIAGNOSIS — Z7401 Bed confinement status: Secondary | ICD-10-CM | POA: Diagnosis not present

## 2020-09-02 DIAGNOSIS — Z8042 Family history of malignant neoplasm of prostate: Secondary | ICD-10-CM

## 2020-09-02 DIAGNOSIS — T451X5A Adverse effect of antineoplastic and immunosuppressive drugs, initial encounter: Secondary | ICD-10-CM | POA: Diagnosis present

## 2020-09-02 DIAGNOSIS — M2550 Pain in unspecified joint: Secondary | ICD-10-CM | POA: Diagnosis not present

## 2020-09-02 DIAGNOSIS — D649 Anemia, unspecified: Secondary | ICD-10-CM | POA: Diagnosis not present

## 2020-09-02 DIAGNOSIS — K311 Adult hypertrophic pyloric stenosis: Secondary | ICD-10-CM | POA: Diagnosis not present

## 2020-09-02 DIAGNOSIS — Z8711 Personal history of peptic ulcer disease: Secondary | ICD-10-CM

## 2020-09-02 DIAGNOSIS — C786 Secondary malignant neoplasm of retroperitoneum and peritoneum: Secondary | ICD-10-CM | POA: Diagnosis present

## 2020-09-02 DIAGNOSIS — Z7901 Long term (current) use of anticoagulants: Secondary | ICD-10-CM

## 2020-09-02 DIAGNOSIS — D62 Acute posthemorrhagic anemia: Secondary | ICD-10-CM | POA: Diagnosis not present

## 2020-09-02 DIAGNOSIS — R17 Unspecified jaundice: Secondary | ICD-10-CM

## 2020-09-02 DIAGNOSIS — G20 Parkinson's disease: Secondary | ICD-10-CM | POA: Diagnosis present

## 2020-09-02 DIAGNOSIS — R52 Pain, unspecified: Secondary | ICD-10-CM | POA: Diagnosis not present

## 2020-09-02 DIAGNOSIS — Z833 Family history of diabetes mellitus: Secondary | ICD-10-CM

## 2020-09-02 DIAGNOSIS — E785 Hyperlipidemia, unspecified: Secondary | ICD-10-CM | POA: Diagnosis present

## 2020-09-02 DIAGNOSIS — Z6820 Body mass index (BMI) 20.0-20.9, adult: Secondary | ICD-10-CM | POA: Diagnosis not present

## 2020-09-02 DIAGNOSIS — Z8673 Personal history of transient ischemic attack (TIA), and cerebral infarction without residual deficits: Secondary | ICD-10-CM

## 2020-09-02 DIAGNOSIS — C787 Secondary malignant neoplasm of liver and intrahepatic bile duct: Secondary | ICD-10-CM | POA: Diagnosis present

## 2020-09-02 DIAGNOSIS — R945 Abnormal results of liver function studies: Secondary | ICD-10-CM | POA: Diagnosis not present

## 2020-09-02 DIAGNOSIS — C772 Secondary and unspecified malignant neoplasm of intra-abdominal lymph nodes: Secondary | ICD-10-CM | POA: Diagnosis present

## 2020-09-02 DIAGNOSIS — I82413 Acute embolism and thrombosis of femoral vein, bilateral: Secondary | ICD-10-CM | POA: Diagnosis present

## 2020-09-02 DIAGNOSIS — Z86711 Personal history of pulmonary embolism: Secondary | ICD-10-CM

## 2020-09-02 DIAGNOSIS — D630 Anemia in neoplastic disease: Secondary | ICD-10-CM | POA: Diagnosis present

## 2020-09-02 DIAGNOSIS — Z452 Encounter for adjustment and management of vascular access device: Secondary | ICD-10-CM | POA: Diagnosis not present

## 2020-09-02 DIAGNOSIS — R404 Transient alteration of awareness: Secondary | ICD-10-CM | POA: Diagnosis not present

## 2020-09-02 DIAGNOSIS — E43 Unspecified severe protein-calorie malnutrition: Secondary | ICD-10-CM | POA: Diagnosis not present

## 2020-09-02 DIAGNOSIS — C169 Malignant neoplasm of stomach, unspecified: Secondary | ICD-10-CM | POA: Diagnosis not present

## 2020-09-02 DIAGNOSIS — R64 Cachexia: Secondary | ICD-10-CM | POA: Diagnosis present

## 2020-09-02 DIAGNOSIS — R351 Nocturia: Secondary | ICD-10-CM | POA: Diagnosis present

## 2020-09-02 DIAGNOSIS — Z66 Do not resuscitate: Secondary | ICD-10-CM | POA: Diagnosis present

## 2020-09-02 DIAGNOSIS — R410 Disorientation, unspecified: Secondary | ICD-10-CM | POA: Diagnosis present

## 2020-09-02 DIAGNOSIS — K219 Gastro-esophageal reflux disease without esophagitis: Secondary | ICD-10-CM | POA: Diagnosis present

## 2020-09-02 DIAGNOSIS — Z86718 Personal history of other venous thrombosis and embolism: Secondary | ICD-10-CM | POA: Diagnosis not present

## 2020-09-02 DIAGNOSIS — Z79899 Other long term (current) drug therapy: Secondary | ICD-10-CM

## 2020-09-02 DIAGNOSIS — Z7189 Other specified counseling: Secondary | ICD-10-CM | POA: Diagnosis not present

## 2020-09-02 DIAGNOSIS — K922 Gastrointestinal hemorrhage, unspecified: Secondary | ICD-10-CM | POA: Diagnosis present

## 2020-09-02 DIAGNOSIS — I10 Essential (primary) hypertension: Secondary | ICD-10-CM | POA: Diagnosis present

## 2020-09-02 DIAGNOSIS — G620 Drug-induced polyneuropathy: Secondary | ICD-10-CM | POA: Diagnosis present

## 2020-09-02 DIAGNOSIS — R0602 Shortness of breath: Secondary | ICD-10-CM | POA: Diagnosis not present

## 2020-09-02 DIAGNOSIS — R748 Abnormal levels of other serum enzymes: Secondary | ICD-10-CM

## 2020-09-02 DIAGNOSIS — K3189 Other diseases of stomach and duodenum: Secondary | ICD-10-CM | POA: Diagnosis not present

## 2020-09-02 DIAGNOSIS — R1111 Vomiting without nausea: Secondary | ICD-10-CM | POA: Diagnosis not present

## 2020-09-02 DIAGNOSIS — R262 Difficulty in walking, not elsewhere classified: Secondary | ICD-10-CM | POA: Diagnosis present

## 2020-09-02 DIAGNOSIS — R4182 Altered mental status, unspecified: Secondary | ICD-10-CM | POA: Diagnosis not present

## 2020-09-02 DIAGNOSIS — R195 Other fecal abnormalities: Secondary | ICD-10-CM | POA: Diagnosis present

## 2020-09-02 LAB — RESP PANEL BY RT-PCR (FLU A&B, COVID) ARPGX2
Influenza A by PCR: NEGATIVE
Influenza B by PCR: NEGATIVE
SARS Coronavirus 2 by RT PCR: NEGATIVE

## 2020-09-02 LAB — CBC
HCT: 17.5 % — ABNORMAL LOW (ref 39.0–52.0)
Hemoglobin: 5.2 g/dL — CL (ref 13.0–17.0)
MCH: 26.7 pg (ref 26.0–34.0)
MCHC: 29.7 g/dL — ABNORMAL LOW (ref 30.0–36.0)
MCV: 89.7 fL (ref 80.0–100.0)
Platelets: 243 10*3/uL (ref 150–400)
RBC: 1.95 MIL/uL — ABNORMAL LOW (ref 4.22–5.81)
RDW: 19.6 % — ABNORMAL HIGH (ref 11.5–15.5)
WBC: 7.2 10*3/uL (ref 4.0–10.5)
nRBC: 0.3 % — ABNORMAL HIGH (ref 0.0–0.2)

## 2020-09-02 LAB — BASIC METABOLIC PANEL
Anion gap: 8 (ref 5–15)
BUN: 30 mg/dL — ABNORMAL HIGH (ref 8–23)
CO2: 26 mmol/L (ref 22–32)
Calcium: 8.2 mg/dL — ABNORMAL LOW (ref 8.9–10.3)
Chloride: 102 mmol/L (ref 98–111)
Creatinine, Ser: 0.58 mg/dL — ABNORMAL LOW (ref 0.61–1.24)
GFR, Estimated: >60 mL/min (ref >60)
Glucose, Bld: 153 mg/dL — ABNORMAL HIGH (ref 70–99)
Potassium: 3.9 mmol/L (ref 3.5–5.1)
Sodium: 136 mmol/L (ref 135–145)

## 2020-09-02 LAB — HEMOGLOBIN AND HEMATOCRIT, BLOOD
HCT: 20.6 % — ABNORMAL LOW (ref 39.0–52.0)
Hemoglobin: 6.2 g/dL — CL (ref 13.0–17.0)

## 2020-09-02 LAB — HEPATIC FUNCTION PANEL
ALT: 43 U/L (ref 0–44)
AST: 214 U/L — ABNORMAL HIGH (ref 15–41)
Albumin: 2.4 g/dL — ABNORMAL LOW (ref 3.5–5.0)
Alkaline Phosphatase: 604 U/L — ABNORMAL HIGH (ref 38–126)
Bilirubin, Direct: 4.3 mg/dL — ABNORMAL HIGH (ref 0.0–0.2)
Indirect Bilirubin: 1.8 mg/dL — ABNORMAL HIGH (ref 0.3–0.9)
Total Bilirubin: 6.1 mg/dL — ABNORMAL HIGH (ref 0.3–1.2)
Total Protein: 5.3 g/dL — ABNORMAL LOW (ref 6.5–8.1)

## 2020-09-02 LAB — URINALYSIS, ROUTINE W REFLEX MICROSCOPIC
Glucose, UA: NEGATIVE mg/dL
Hgb urine dipstick: NEGATIVE
Ketones, ur: NEGATIVE mg/dL
Leukocytes,Ua: NEGATIVE
Nitrite: NEGATIVE
Protein, ur: NEGATIVE mg/dL
Specific Gravity, Urine: >1.046 — ABNORMAL HIGH (ref 1.005–1.030)
pH: 6 (ref 5.0–8.0)

## 2020-09-02 LAB — PREPARE RBC (CROSSMATCH)

## 2020-09-02 LAB — AMMONIA: Ammonia: 24 umol/L (ref 9–35)

## 2020-09-02 LAB — LIPASE, BLOOD: Lipase: 153 U/L — ABNORMAL HIGH (ref 11–51)

## 2020-09-02 LAB — PROTIME-INR
INR: 1.2 (ref 0.8–1.2)
Prothrombin Time: 14.7 seconds (ref 11.4–15.2)

## 2020-09-02 LAB — APTT: aPTT: 30 seconds (ref 24–36)

## 2020-09-02 MED ORDER — ONDANSETRON HCL 4 MG PO TABS: 4.0000 mg | ORAL_TABLET | Freq: Four times a day (QID) | ORAL | Status: DC | PRN

## 2020-09-02 MED ORDER — ONDANSETRON HCL 4 MG/2ML IJ SOLN
4.0000 mg | Freq: Four times a day (QID) | INTRAMUSCULAR | Status: DC | PRN
  Administered 2020-09-03: 4 mg via INTRAVENOUS
  Filled 2020-09-02: qty 2

## 2020-09-02 MED ORDER — IOHEXOL 300 MG/ML  SOLN
100.0000 mL | Freq: Once | INTRAMUSCULAR | Status: AC | PRN
  Administered 2020-09-02: 100 mL via INTRAVENOUS

## 2020-09-02 MED ORDER — CARBIDOPA-LEVODOPA 25-100 MG PO TABS
1.0000 | ORAL_TABLET | Freq: Three times a day (TID) | ORAL | Status: DC
  Administered 2020-09-02 – 2020-09-05 (×8): 1 via ORAL
  Filled 2020-09-02 (×8): qty 1

## 2020-09-02 MED ORDER — SODIUM CHLORIDE 0.9% IV SOLUTION: Freq: Once | INTRAVENOUS | Status: AC

## 2020-09-02 MED ORDER — SODIUM CHLORIDE 0.9 % IV BOLUS
500.0000 mL | Freq: Once | INTRAVENOUS | Status: AC
  Administered 2020-09-02: 500 mL via INTRAVENOUS

## 2020-09-02 NOTE — ED Notes (Signed)
I have just given report to [place], RN on [place]. Will transport shortly.

## 2020-09-02 NOTE — H&P (Signed)
History and Physical    Drew [name] WFU:[id number] DOB: 1946-01-09 DOA: 09/02/2020  PCP: Gaynelle Arabian, MD  Patient coming from: Home  Chief Complaint: Fatigue  HPI: Drew [name] is a 75 y.o. male with medical history significant of gastric adenocarcinoma, HTN, GERD. Presenting with fatigue and confusion. Hx is per caregiver as patient is a poor historian. He was weak and confused this morning according to the in-home caregiver. She states he had difficulty walking to the bathroom. He was complaining of dyspnea during that time. He had to rest in a chair a few steps from his bed and sat for a while. He tried to get up again and walk; however, he had to return to the chair for rest. He then seemed disoriented. He asked to go to the hospital then and started vomiting. EMS was then called.   ED Course: He was found to have a general uptrend in his LFTs. He was also found to be significantly anemic. Blood was ordered. Onco was consulted. They recommended CT ab/pelvis followed by MRCP. GI was consulted. They will see him in the AM. [facility] was called for admission.   Review of Systems:  Unable to obtain d/t mentation.   PMHx Past Medical History:  Diagnosis Date  . ED (erectile dysfunction)   . gastric ca dx'd 12/2019  . GERD (gastroesophageal reflux disease)   . High cholesterol   . Hypertension   . Hypogonadism male   . Prediabetes   . PUD (peptic ulcer disease)     PSHx Past Surgical History:  Procedure Laterality Date  . DUODENAL STENT PLACEMENT N/A 06/04/2020   Procedure: DUODENAL STENT PLACEMENT;  Surgeon: Clarene Essex, MD;  Location: [facility] ENDOSCOPY;  Service: Endoscopy;  Laterality: N/A;  . ESOPHAGOGASTRODUODENOSCOPY (EGD) WITH PROPOFOL N/A 06/04/2020   Procedure: ESOPHAGOGASTRODUODENOSCOPY (EGD) WITH PROPOFOL and possible stenting with fluoroscopy;  Surgeon: Clarene Essex, MD;  Location: [facility] ENDOSCOPY;  Service: Endoscopy;  Laterality: N/A;  . FINGER AMPUTATION    . TONSILLECTOMY       SocHx  reports that he has quit smoking. He has never used smokeless tobacco. He reports current alcohol use of about 10.0 standard drinks of alcohol per week. He reports that he does not use drugs.  Allergies  Allergen Reactions  . Atorvastatin Other (See Comments)  . Lactose Intolerance (Gi)   . Pravastatin Other (See Comments)    FamHx Family History  Problem Relation Age of Onset  . Diabetes Other   . Prostate cancer Brother        both brothers    Prior to Admission medications   Medication Sig Start Date End Date Taking? Authorizing Provider  acetaminophen (TYLENOL) 500 MG tablet Take 1,000 mg by mouth at bedtime.   Yes [provider]  apixaban (ELIQUIS) 2.5 MG TABS tablet Take 1 tablet (2.5 mg total) by mouth daily. 08/24/20  Yes Ladell Pier, MD  atorvastatin (LIPITOR) 10 MG tablet Take 10 mg by mouth daily. 07/27/20  Yes [provider]  baclofen (LIORESAL) 10 MG tablet Take 1 tablet (10 mg total) by mouth 3 (three) times daily as needed for muscle spasms. 06/01/20  Yes Ladell Pier, MD  carbidopa-levodopa (SINEMET IR) 25-100 MG tablet Take 1 tablet by mouth 3 (three) times daily. 08/24/20  Yes Ladell Pier, MD  FEROSUL 325 (65 Fe) MG tablet Take 1 tablet (325 mg total) by mouth daily. 04/10/20  Yes Ladell Pier, MD  metoprolol succinate (TOPROL-XL) 25 MG  24 hr tablet Take 1 tablet (25 mg total) by mouth daily. 05/08/20  Yes Owens Shark, NP  Omega-3 Fatty Acids (FISH OIL) 1000 MG CAPS Take 2 capsules by mouth daily.   Yes [provider]  pantoprazole (PROTONIX) 40 MG tablet Take 1 tablet (40 mg total) by mouth 2 (two) times daily. 05/08/20  Yes Owens Shark, NP  polyethylene glycol (MIRALAX / GLYCOLAX) 17 g packet Take 17 g by mouth daily as needed. Patient taking differently: Take 17 g by mouth daily as needed for mild constipation. 01/29/20  Yes Kc, Maren Beach, MD  potassium chloride SA (KLOR-CON) 20 MEQ tablet TAKE 1 TABLET(20  MEQ) BY MOUTH DAILY Patient taking differently: Take 20 mEq by mouth daily. 07/27/20  Yes Ladell Pier, MD  prochlorperazine (COMPAZINE) 10 MG tablet Take 1 tablet (10 mg total) by mouth every 6 (six) hours as needed for nausea or vomiting. 07/27/20  Yes Ladell Pier, MD  tamsulosin (FLOMAX) 0.4 MG CAPS capsule TAKE 1 CAPSULE(0.4 MG) BY MOUTH DAILY Patient taking differently: Take 0.4 mg by mouth daily. 07/27/20  Yes Ladell Pier, MD  dexamethasone (DECADRON) 2 MG tablet Take 5 tablets (10 mg total) by mouth as directed. Patient not taking: No sig reported 06/19/20   Ladell Pier, MD  diphenoxylate-atropine (LOMOTIL) 2.5-0.025 MG tablet TAKE 1-2 TABLETS BY MOUTH FOUR TIMES DAILY AS NEEDED FOR DIARHEA OR LOOSE STOOLS Patient not taking: No sig reported 05/02/20   Ladell Pier, MD  lidocaine-prilocaine (EMLA) cream Apply 1 application topically as needed. Patient not taking: No sig reported 01/27/20   Ladell Pier, MD    Physical Exam: Vitals:   09/02/20 1415 09/02/20 1425 09/02/20 1430 09/02/20 1445  BP: 119/70 115/71 119/72 120/68  Pulse: 92 89 90 86  Resp: 18 18 17 16   Temp:  98.8 F (37.1 C)    TempSrc:  Oral    SpO2: 100% 100% 100% 100%    General: 75 y.o. male resting in bed in NAD Eyes: PERRL, normal sclera ENMT: Nares patent w/o discharge, orophaynx clear, dentition normal, ears w/o discharge/lesions/ulcers Neck: Supple, trachea midline Cardiovascular: RRR, +S1, S2, no m/g/r, equal pulses throughout Respiratory: CTABL, no w/r/r, normal WOB GI: BS+, NDNT, no masses noted, no organomegaly noted MSK: No e/c/c Skin: No rashes, bruises, ulcerations noted Neuro: A&O x 3, no focal deficits Psyc: Appropriate interaction and affect, calm/cooperative  Labs on Admission: I have personally reviewed following labs and imaging studies  CBC: Recent Labs  Lab 09/02/20 1127  WBC 7.2  HGB 5.2*  HCT 17.5*  MCV 89.7  PLT 595   Basic Metabolic Panel: Recent Labs   Lab 09/02/20 1127  NA 136  K 3.9  CL 102  CO2 26  GLUCOSE 153*  BUN 30*  CREATININE 0.58*  CALCIUM 8.2*   GFR: Estimated Creatinine Clearance: 70.4 mL/min (A) (by C-G formula based on SCr of 0.58 mg/dL (L)). Liver Function Tests: Recent Labs  Lab 09/02/20 1203  AST 214*  ALT 43  ALKPHOS 604*  BILITOT 6.1*  PROT 5.3*  ALBUMIN 2.4*   Recent Labs  Lab 09/02/20 1203  LIPASE 153*   No results for input(s): AMMONIA in the last 168 hours. Coagulation Profile: Recent Labs  Lab 09/02/20 1203  INR 1.2   Cardiac Enzymes: No results for input(s): CKTOTAL, CKMB, CKMBINDEX, TROPONINI in the last 168 hours. BNP (last 3 results) No results for input(s): PROBNP in the last 8760 hours. HbA1C: No results  for input(s): HGBA1C in the last 72 hours. CBG: No results for input(s): GLUCAP in the last 168 hours. Lipid Profile: No results for input(s): CHOL, HDL, LDLCALC, TRIG, CHOLHDL, LDLDIRECT in the last 72 hours. Thyroid Function Tests: No results for input(s): TSH, T4TOTAL, FREET4, T3FREE, THYROIDAB in the last 72 hours. Anemia Panel: No results for input(s): VITAMINB12, FOLATE, FERRITIN, TIBC, IRON, RETICCTPCT in the last 72 hours. Urine analysis:    Component Value Date/Time   COLORURINE YELLOW 06/02/2020 [place] 06/02/2020 1228   LABSPEC 1.009 06/02/2020 1228   PHURINE 7.0 06/02/2020 1228   GLUCOSEU NEGATIVE 06/02/2020 1228   HGBUR NEGATIVE 06/02/2020 1228   BILIRUBINUR NEGATIVE 06/02/2020 [place] 06/02/2020 1228   PROTEINUR 30 (A) 08/24/2020 0955   UROBILINOGEN 0.2 09/25/2011 2206   NITRITE NEGATIVE 06/02/2020 [place] 06/02/2020 1228    Radiological Exams on Admission: DG Chest 1 View  Result Date: 09/02/2020 CLINICAL DATA:  Vomiting. Family reports disorientation. History of stage IV gastric cancer. EXAM: CHEST  1 VIEW COMPARISON:  02/13/2020 FINDINGS: Right chest wall port a catheter is noted with tip at  the cavoatrial junction. The heart size appears normal. No pleural effusion or edema identified. No airspace consolidation. IMPRESSION: No acute cardiopulmonary abnormalities. Electronically Signed   By: Kerby Moors M.D.   On: 09/02/2020 12:30    EKG: Independently reviewed. Sinus, no st elevations  Assessment/Plan Symptomatic anemia     - admit to inpt, tele     - Hgb 5.2; transfuse pRBCs     - protonix, q6h H&H     - [facility] consulted, will see in AM  Gastric CA Elevated LFTs     - CT ab/pelvis as above     - checking MRCP     - onco consulted, appreciate assistance  N/V Hx of gastric outlet obstruction s/p stent placement     - anti-emetics, protonix, CLD  Hx of CVA     - continue statin as able  Hx of pulmonary eboli     - previously on eliquis; but held in an earlier admission according to outpt onco note; hold now anyway d/t bleed  HLD     - hold statin d/t LFTs  DVT prophylaxis: SCDs  Code Status: DNR  Family Communication: w/ wife and caregiver by phone  Consults called: [facility]   Status is: Inpatient  Remains inpatient appropriate because:Inpatient level of care appropriate due to severity of illness   Dispo: The patient is from: Home              Anticipated d/c is to: Home              Anticipated d/c date is: 3 days              Patient currently is not medically stable to d/c.   Difficult to place patient No  Jonnie Finner DO [facility]  If 7PM-7AM, please contact night-coverage [email or web address]  09/02/2020, 3:12 PM

## 2020-09-02 NOTE — ED Triage Notes (Addendum)
Per EMS, patient from home, vomiting since yesterday. Family reports disorientation. Home health at home today. Hx stage 4 stomach cancer. Patient c/o weakness.  22g hand 4mg  Zofran 575ml NS

## 2020-09-02 NOTE — ED Provider Notes (Addendum)
[facility] DEPT Provider Note   CSN: [id number] Arrival date & time: 09/02/20  1029     History Chief Complaint  Patient presents with  . Emesis    Drew [name] is a 75 y.o. male presented for evaluation nausea, vomiting, weakness, confusion, shortness of breath.  Patient states he has been having nausea and vomiting.  Also feeling weak.  He was given medicine with EMS, feels better now.  He does report he feels dehydrated.  He reports urine is dark, and he is not urinating as much.  He denies fevers, chills, chest pain, shortness of breath, abdominal pain, or change in bowel movements.  He denies hematemesis, hematuria, or blood in his stool. He is on a blood thinner.  Additional history obtained from patient's caregiver, Glenard Haring.  She states patient has not been as with it recently, intermittently confused/forgetting things.  However this is not of the time.  Additional history obtained from patient's daughter, Magdalene River. She reports pt has been more SOB than normal. reports pt does not normal have issues with his bili, lipase, or jaundice  HPI     Past Medical History:  Diagnosis Date  . ED (erectile dysfunction)   . gastric ca dx'd 12/2019  . GERD (gastroesophageal reflux disease)   . High cholesterol   . Hypertension   . Hypogonadism male   . Prediabetes   . PUD (peptic ulcer disease)     Patient Active Problem List   Diagnosis Date Noted  . Symptomatic anemia 09/02/2020  . GIB (gastrointestinal bleeding) 06/02/2020  . Port-A-Cath in place 01/31/2020  . Abdominal distention   . Palliative care by specialist   . DNR (do not resuscitate)   . DNR (do not resuscitate) discussion   . Advanced care planning/counseling discussion   . Goals of care, counseling/discussion 01/26/2020  . Sepsis ([place]) 01/22/2020  . CVA (cerebral vascular accident) ([place]) 01/20/2020  . Gastric outlet obstruction 01/20/2020  . Pulmonary embolism ([place]) 01/20/2020  .  Severe protein-calorie malnutrition ([place]) 01/20/2020  . 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 Iron deficiency 01/06/2020  . Acute blood loss anemia 01/03/2020  . Acute upper GI bleed 01/03/2020  . Metastasis ([place]) 01/03/2020  . Pleural effusion, right 01/03/2020    Past Surgical History:  Procedure Laterality Date  . DUODENAL STENT PLACEMENT N/A 06/04/2020   Procedure: DUODENAL STENT PLACEMENT;  Surgeon: Clarene Essex, MD;  Location: [facility]COPY;  Service: Endoscopy;  Laterality: N/A;  . ESOPHAGOGASTRODUODENOSCOPY (EGD) WITH PROPOFOL N/A 06/04/2020   Procedure: ESOPHAGOGASTRODUODENOSCOPY (EGD) WITH PROPOFOL and possible stenting with fluoroscopy;  Surgeon: Clarene Essex, MD;  Location: [facility];  Service: Endoscopy;  Laterality: N/A;  . FINGER AMPUTATION    . TONSILLECTOMY         Family History  Problem Relation Age of Onset  . Diabetes Other   . Prostate cancer Brother        both brothers    Social History   Tobacco Use  . Smoking status: Former Research scientist (life sciences)  . Smokeless tobacco: Never Used  Vaping Use  . Vaping Use: Never used  Substance Use Topics  . Alcohol use: Yes    Alcohol/week: 10.0 standard drinks    Types: 10 Glasses of wine per week  . Drug use: No    Home Medications Prior to Admission medications   Medication Sig Start Date End Date Taking? Authorizing Provider  acetaminophen (TYLENOL) 500 MG tablet Take 1,000 mg by mouth at bedtime.   Yes [provider]  apixaban (ELIQUIS) 2.5 MG TABS tablet Take 1 tablet (2.5 mg total) by mouth daily. 08/24/20  Yes Ladell Pier, MD  atorvastatin (LIPITOR) 10 MG tablet Take 10 mg by mouth daily. 07/27/20  Yes [provider]  baclofen (LIORESAL) 10 MG tablet Take 1 tablet (10 mg total) by mouth 3 (three) times daily as needed for muscle spasms. 06/01/20  Yes Ladell Pier, MD  carbidopa-levodopa (SINEMET IR) 25-100 MG tablet Take 1 tablet by mouth 3 (three) times daily. 08/24/20  Yes Ladell Pier, MD  FEROSUL 325 (65 Fe) MG tablet Take 1 tablet (325 mg total) by mouth daily. 04/10/20  Yes Ladell Pier, MD  metoprolol succinate (TOPROL-XL) 25 MG 24 hr tablet Take 1 tablet (25 mg total) by mouth daily. 05/08/20  Yes Owens Shark, NP  Omega-3 Fatty Acids (FISH OIL) 1000 MG CAPS Take 2 capsules by mouth daily.   Yes [provider]  pantoprazole (PROTONIX) 40 MG tablet Take 1 tablet (40 mg total) by mouth 2 (two) times daily. 05/08/20  Yes Owens Shark, NP  polyethylene glycol (MIRALAX / GLYCOLAX) 17 g packet Take 17 g by mouth daily as needed. Patient taking differently: Take 17 g by mouth daily as needed for mild constipation. 01/29/20  Yes Kc, Maren Beach, MD  potassium chloride SA (KLOR-CON) 20 MEQ tablet TAKE 1 TABLET(20 MEQ) BY MOUTH DAILY Patient taking differently: Take 20 mEq by mouth daily. 07/27/20  Yes Ladell Pier, MD  prochlorperazine (COMPAZINE) 10 MG tablet Take 1 tablet (10 mg total) by mouth every 6 (six) hours as needed for nausea or vomiting. 07/27/20  Yes Ladell Pier, MD  tamsulosin (FLOMAX) 0.4 MG CAPS capsule TAKE 1 CAPSULE(0.4 MG) BY MOUTH DAILY Patient taking differently: Take 0.4 mg by mouth daily. 07/27/20  Yes Ladell Pier, MD  dexamethasone (DECADRON) 2 MG tablet Take 5 tablets (10 mg total) by mouth as directed. Patient not taking: No sig reported 06/19/20   Ladell Pier, MD  diphenoxylate-atropine (LOMOTIL) 2.5-0.025 MG tablet TAKE 1-2 TABLETS BY MOUTH FOUR TIMES DAILY AS NEEDED FOR DIARHEA OR LOOSE STOOLS Patient not taking: No sig reported 05/02/20   Ladell Pier, MD  lidocaine-prilocaine (EMLA) cream Apply 1 application topically as needed. Patient not taking: No sig reported 01/27/20   Ladell Pier, MD    Allergies    Atorvastatin, Lactose intolerance (gi), and Pravastatin  Review of Systems   Review of Systems  Respiratory: Positive for shortness of breath.   Gastrointestinal: Positive for nausea and vomiting.   Neurological: Positive for weakness.  Psychiatric/Behavioral: Positive for confusion.  All other systems reviewed and are negative.   Physical Exam Updated Vital Signs BP 120/68   Pulse 86   Temp 98.8 F (37.1 C) (Oral)   Resp 16   SpO2 100%   Physical Exam Vitals and nursing note reviewed.  Constitutional:      General: He is not in acute distress.    Appearance: He is well-developed and well-nourished. He is ill-appearing.     Comments: Appears weak, chronically ill  HENT:     Head: Normocephalic and atraumatic.  Eyes:     General: Scleral icterus present.     Extraocular Movements: EOM normal.     Conjunctiva/sclera: Conjunctivae normal.     Pupils: Pupils are equal, round, and reactive to light.  Cardiovascular:     Rate and Rhythm: Normal rate and regular rhythm.  Pulses: Normal pulses and intact distal pulses.  Pulmonary:     Effort: Pulmonary effort is normal. No respiratory distress.     Breath sounds: Normal breath sounds. No wheezing.  Abdominal:     General: There is no distension.     Palpations: Abdomen is soft. There is no mass.     Tenderness: There is no abdominal tenderness. There is no guarding or rebound.  Musculoskeletal:        General: Normal range of motion.     Cervical back: Normal range of motion and neck supple.  Skin:    General: Skin is warm and dry.     Capillary Refill: Capillary refill takes less than 2 seconds.  Neurological:     Mental Status: He is alert and oriented to person, place, and time.  Psychiatric:        Mood and Affect: Mood and affect normal.     ED Results / Procedures / Treatments   Labs (all labs ordered are listed, but only abnormal results are displayed) Labs Reviewed  BASIC METABOLIC PANEL - Abnormal; Notable for the following components:      Result Value   Glucose, Bld 153 (*)    BUN 30 (*)    Creatinine, Ser 0.58 (*)    Calcium 8.2 (*)    All other components within normal limits  CBC -  Abnormal; Notable for the following components:   RBC 1.95 (*)    Hemoglobin 5.2 (*)    HCT 17.5 (*)    MCHC 29.7 (*)    RDW 19.6 (*)    nRBC 0.3 (*)    All other components within normal limits  HEPATIC FUNCTION PANEL - Abnormal; Notable for the following components:   Total Protein 5.3 (*)    Albumin 2.4 (*)    AST 214 (*)    Alkaline Phosphatase 604 (*)    Total Bilirubin 6.1 (*)    Bilirubin, Direct 4.3 (*)    Indirect Bilirubin 1.8 (*)    All other components within normal limits  LIPASE, BLOOD - Abnormal; Notable for the following components:   Lipase 153 (*)    All other components within normal limits  RESP PANEL BY RT-PCR (FLU A&B, COVID) ARPGX2  APTT  PROTIME-INR  URINALYSIS, ROUTINE W REFLEX MICROSCOPIC  AMMONIA  TYPE AND SCREEN  PREPARE RBC (CROSSMATCH)    EKG EKG Interpretation  Date/Time:  Sunday September 02 2020 11:13:44 EST Ventricular Rate:  97 PR Interval:    QRS Duration: 75 QT Interval:  338 QTC Calculation: 430 R Axis:   39 Text Interpretation: Sinus rhythm Low voltage, precordial leads when compared to prior, similar apperance. No STEMI Confirmed by Antony Blackbird [id number]) on 09/02/2020 12:32:55 PM   Radiology DG Chest 1 View  Result Date: 09/02/2020 CLINICAL DATA:  Vomiting. Family reports disorientation. History of stage IV gastric cancer. EXAM: CHEST  1 VIEW COMPARISON:  02/13/2020 FINDINGS: Right chest wall port a catheter is noted with tip at the cavoatrial junction. The heart size appears normal. No pleural effusion or edema identified. No airspace consolidation. IMPRESSION: No acute cardiopulmonary abnormalities. Electronically Signed   By: Kerby Moors M.D.   On: 09/02/2020 12:30    Procedures .Critical Care Performed by: Franchot Heidelberg, PA-C Authorized by: Franchot Heidelberg, PA-C   Critical care provider statement:    Critical care time (minutes):  45   Critical care time was exclusive of:  Separately billable procedures and  treating other patients and  teaching time   Critical care was necessary to treat or prevent imminent or life-threatening deterioration of the following conditions:  Circulatory failure and hepatic failure   Critical care was time spent personally by me on the following activities:  Blood draw for specimens, development of treatment plan with patient or surrogate, discussions with consultants, examination of patient, evaluation of patient's response to treatment, obtaining history from patient or surrogate, ordering and performing treatments and interventions, ordering and review of laboratory studies, ordering and review of radiographic studies, pulse oximetry, review of old charts and re-evaluation of patient's condition   I assumed direction of critical care for this patient from another provider in my specialty: no     Care discussed with: admitting provider   Comments:     Critically low hgb. Given blood and admitted.      Medications Ordered in ED Medications  iohexol (OMNIPAQUE) 300 MG/ML solution 100 mL (has no administration in time range)  sodium chloride 0.9 % bolus 500 mL (0 mLs Intravenous Stopped 09/02/20 1254)  0.9 %  sodium chloride infusion (Manually program via Guardrails IV Fluids) ( Intravenous New Bag/Given 09/02/20 1411)    ED Course  I have reviewed the triage vital signs and the nursing notes.  Pertinent labs & imaging results that were available during my care of the patient were reviewed by me and considered in my medical decision making (see chart for details).    MDM Rules/Calculators/A&P                          Patient presenting for evaluation of vomiting, confusion, weakness.  On exam, patient appears chronically ill.  He has scleral icterus, is underweight.  In the setting of gastric cancer, concern for sepsis either spread or obstruction of the pancreas/liver.  Will obtain abdominal labs.  Labs interpreted by me, shows critically low hemoglobin of 5.2.   Additionally, patient's LFTs show elevated bili and elevated lipase.  Will obtain CT abdomen pelvis to look for obstructive cause.  Patient's confusion is intermittent per her caregiver, and more related to not being quite awake/forgetting intermittently.  As such, low suspicion for mets to the head, will hold off on head imaging.   Patient's daughter, Venida Jarvis, updated regarding patient status and plan.  Discussed with Dr. Irene Limbo from oncology, they will consult on the patient while he is admitted.  Recommends possible MRCP if CT abdomen pelvis is negative.  Discussed with Dr. Marylyn Ishihara from [facility] hospitalist service, pt to be admitted. Requests GI consult.  Discussed with Dr. Watt Climes from GI, they will consult in AM  Final Clinical Impression(s) / ED Diagnoses Final diagnoses:  Acute on chronic anemia  Jaundice  Hyperbilirubinemia  Elevated lipase    Rx / DC Orders ED Discharge Orders    None       Franchot Heidelberg, PA-C 09/02/20 [place], Linda Biehn, PA-C 09/02/20 1530    Tegeler, Gwenyth Allegra, MD 09/02/20 1544

## 2020-09-03 ENCOUNTER — Encounter (HOSPITAL_COMMUNITY): Payer: Self-pay | Admitting: Internal Medicine

## 2020-09-03 DIAGNOSIS — R627 Adult failure to thrive: Secondary | ICD-10-CM

## 2020-09-03 DIAGNOSIS — R748 Abnormal levels of other serum enzymes: Secondary | ICD-10-CM

## 2020-09-03 DIAGNOSIS — Z66 Do not resuscitate: Secondary | ICD-10-CM

## 2020-09-03 DIAGNOSIS — C799 Secondary malignant neoplasm of unspecified site: Secondary | ICD-10-CM

## 2020-09-03 DIAGNOSIS — C169 Malignant neoplasm of stomach, unspecified: Secondary | ICD-10-CM

## 2020-09-03 DIAGNOSIS — D649 Anemia, unspecified: Secondary | ICD-10-CM | POA: Diagnosis not present

## 2020-09-03 DIAGNOSIS — D62 Acute posthemorrhagic anemia: Principal | ICD-10-CM

## 2020-09-03 DIAGNOSIS — Z86711 Personal history of pulmonary embolism: Secondary | ICD-10-CM

## 2020-09-03 DIAGNOSIS — Z7189 Other specified counseling: Secondary | ICD-10-CM

## 2020-09-03 LAB — COMPREHENSIVE METABOLIC PANEL
ALT: 74 U/L — ABNORMAL HIGH (ref 0–44)
AST: 195 U/L — ABNORMAL HIGH (ref 15–41)
Albumin: 2.1 g/dL — ABNORMAL LOW (ref 3.5–5.0)
Alkaline Phosphatase: 586 U/L — ABNORMAL HIGH (ref 38–126)
Anion gap: 8 (ref 5–15)
BUN: 33 mg/dL — ABNORMAL HIGH (ref 8–23)
CO2: 25 mmol/L (ref 22–32)
Calcium: 8.2 mg/dL — ABNORMAL LOW (ref 8.9–10.3)
Chloride: 104 mmol/L (ref 98–111)
Creatinine, Ser: 0.62 mg/dL (ref 0.61–1.24)
GFR, Estimated: >60 mL/min (ref >60)
Glucose, Bld: 144 mg/dL — ABNORMAL HIGH (ref 70–99)
Potassium: 4.3 mmol/L (ref 3.5–5.1)
Sodium: 137 mmol/L (ref 135–145)
Total Bilirubin: 6.6 mg/dL — ABNORMAL HIGH (ref 0.3–1.2)
Total Protein: 4.8 g/dL — ABNORMAL LOW (ref 6.5–8.1)

## 2020-09-03 LAB — CK: Total CK: 15 U/L — ABNORMAL LOW (ref 49–397)

## 2020-09-03 LAB — HEMOGLOBIN AND HEMATOCRIT, BLOOD
HCT: 20.8 % — ABNORMAL LOW (ref 39.0–52.0)
HCT: 19.7 % — ABNORMAL LOW (ref 39.0–52.0)
HCT: 23 % — ABNORMAL LOW (ref 39.0–52.0)
HCT: 26.5 % — ABNORMAL LOW (ref 39.0–52.0)
Hemoglobin: 6.3 g/dL — CL (ref 13.0–17.0)
Hemoglobin: 5.9 g/dL — CL (ref 13.0–17.0)
Hemoglobin: 7.1 g/dL — ABNORMAL LOW (ref 13.0–17.0)
Hemoglobin: 8.5 g/dL — ABNORMAL LOW (ref 13.0–17.0)

## 2020-09-03 LAB — PROTIME-INR
INR: 1.2 (ref 0.8–1.2)
Prothrombin Time: 14.7 seconds (ref 11.4–15.2)

## 2020-09-03 LAB — PREPARE RBC (CROSSMATCH)

## 2020-09-03 LAB — LACTATE DEHYDROGENASE: LDH: 214 U/L — ABNORMAL HIGH (ref 98–192)

## 2020-09-03 MED ORDER — SODIUM CHLORIDE 0.9% IV SOLUTION: Freq: Once | INTRAVENOUS | Status: AC

## 2020-09-03 MED ORDER — PROCHLORPERAZINE EDISYLATE 10 MG/2ML IJ SOLN
10.0000 mg | Freq: Four times a day (QID) | INTRAMUSCULAR | Status: DC | PRN
  Administered 2020-09-03 – 2020-09-04 (×2): 10 mg via INTRAVENOUS
  Filled 2020-09-03 (×2): qty 2

## 2020-09-03 MED ORDER — SODIUM CHLORIDE 0.9 % IV SOLN: INTRAVENOUS | Status: DC

## 2020-09-03 MED ORDER — SODIUM CHLORIDE 0.9 % IV SOLN
8.0000 mg/h | INTRAVENOUS | Status: DC
  Administered 2020-09-03 – 2020-09-04 (×4): 8 mg/h via INTRAVENOUS
  Filled 2020-09-03 (×7): qty 80

## 2020-09-03 MED ORDER — PANTOPRAZOLE SODIUM 40 MG IV SOLR: 40.0000 mg | Freq: Two times a day (BID) | INTRAVENOUS | Status: DC

## 2020-09-03 MED ORDER — MELATONIN 5 MG PO TABS
5.0000 mg | ORAL_TABLET | Freq: Every evening | ORAL | Status: DC | PRN
  Administered 2020-09-03 – 2020-09-04 (×3): 5 mg via ORAL
  Filled 2020-09-03 (×3): qty 1

## 2020-09-03 MED ORDER — METOPROLOL TARTRATE 25 MG PO TABS
12.5000 mg | ORAL_TABLET | Freq: Two times a day (BID) | ORAL | Status: DC
  Administered 2020-09-03 – 2020-09-05 (×5): 12.5 mg via ORAL
  Filled 2020-09-03 (×5): qty 1

## 2020-09-03 MED ORDER — PANTOPRAZOLE SODIUM 40 MG IV SOLR
40.0000 mg | Freq: Once | INTRAVENOUS | Status: AC
  Administered 2020-09-03: 40 mg via INTRAVENOUS
  Filled 2020-09-03: qty 40

## 2020-09-03 MED ORDER — CHLORHEXIDINE GLUCONATE CLOTH 2 % EX PADS
6.0000 | MEDICATED_PAD | Freq: Every day | CUTANEOUS | Status: DC
  Administered 2020-09-03 – 2020-09-05 (×3): 6 via TOPICAL

## 2020-09-03 MED ORDER — ONDANSETRON HCL 4 MG/2ML IJ SOLN
4.0000 mg | Freq: Four times a day (QID) | INTRAMUSCULAR | Status: DC
  Administered 2020-09-03 – 2020-09-04 (×4): 4 mg via INTRAVENOUS
  Filled 2020-09-03 (×4): qty 2

## 2020-09-03 NOTE — Progress Notes (Signed)
IP PROGRESS NOTE  Subjective:   Drew [name] is well-known to me with a history of metastatic gastric cancer.  He was recently confirmed to have disease progression.  I saw him on 08/24/2020.  He was referred for home hospice care.  He decided against enrollment in hospice.  He presented to the emergency room yesterday with nausea/vomiting.  He reports dark emesis.  He was noted to have severe anemia.  He was admitted for further evaluation.  Drew [name] is currently receiving a red cell transfusion.  He complains of nausea.  He says he has hiccups that are only relieved when he vomits.  Objective: Vital signs in last 24 hours: Blood pressure (!) 147/83, pulse (!) 101, temperature 97.7 F (36.5 C), temperature source Oral, resp. rate 18, height _0  (1.702 m), weight 130 lb 4.8 oz (59.1 kg), SpO2 100 %.  Intake/Output from previous day: 02/20 0701 - 02/21 0700 In: 280 [Blood:280] Out: 400 [Urine:400]  Physical Exam:  HEENT: No thrush Lungs: Decreased breath sounds at the right compared to the left chest, no respiratory distress Cardiac: Regular rate and rhythm Abdomen: Mildly distended, nontender, no mass Extremities: No leg edema   Portacath/PICC-without erythema  Lab Results: Recent Labs    09/02/20 1127 09/02/20 1843 09/03/20 0536 09/03/20 1250  WBC 7.2  --   --   --   HGB 5.2*   < > 5.9* 7.1*  HCT 17.5*   < > 19.7* 23.0*  PLT 243  --   --   --    < > = values in this interval not displayed.    BMET Recent Labs    09/02/20 1127 09/03/20 0536  NA 136 137  K 3.9 4.3  CL 102 104  CO2 26 25  GLUCOSE 153* 144*  BUN 30* 33*  CREATININE 0.58* 0.62  CALCIUM 8.2* 8.2*    Lab Results  Component Value Date   CEA1 14.92 (H) 04/24/2020    Studies/Results: DG Chest 1 View  Result Date: 09/02/2020 CLINICAL DATA:  Vomiting. Family reports disorientation. History of stage IV gastric cancer. EXAM: CHEST  1 VIEW COMPARISON:  02/13/2020 FINDINGS: Right chest wall port a  catheter is noted with tip at the cavoatrial junction. The heart size appears normal. No pleural effusion or edema identified. No airspace consolidation. IMPRESSION: No acute cardiopulmonary abnormalities. Electronically Signed   By: Kerby Moors M.D.   On: 09/02/2020 12:30   CT ABDOMEN PELVIS W CONTRAST  Result Date: 09/02/2020 CLINICAL DATA:  73 male with jaundice. EXAM: CT ABDOMEN AND PELVIS WITH CONTRAST TECHNIQUE: Multidetector CT imaging of the abdomen and pelvis was performed using the standard protocol following bolus administration of intravenous contrast. CONTRAST:  154m OMNIPAQUE IOHEXOL 300 MG/ML  SOLN COMPARISON:  Abdominal CT dated 08/23/2020 FINDINGS: Lower chest: Small right pleural effusion with associated minimal right lung base compressive atelectasis similar to prior CT. Several small pulmonary nodules as seen on the prior CT and concerning for metastatic disease. Partially visualized central venous line with tip in the region of the cavoatrial junction. No intra-abdominal free air.  Small ascites. Hepatobiliary: Multiple hepatic hypodense lesions consistent with metastatic disease as seen on the prior CT. There is intrahepatic biliary ductal dilatation or periportal edema. No calcified gallstone. Pancreas: The pancreas is unremarkable as visualized. Spleen: Normal in size without focal abnormality. Adrenals/Urinary Tract: The adrenal glands unremarkable. There is no hydronephrosis on either side. There is symmetric enhancement and excretion of contrast by both kidneys. Multiple small  bilateral renal cysts. The visualized ureters and urinary bladder appear unremarkable. Stomach/Bowel: Masslike thickening of the distal stomach as seen previously in keeping with known malignancy. A wall stent in the gastric antrum appears in similar position. Interval improvement and decrease in the distension of the stomach compared to the prior CT. Evaluation of stent patency or tumor  infiltration is very limited on this CT. There is moderate stool throughout the colon. No evidence of bowel obstruction. The appendix is normal. Vascular/Lymphatic: Mild aortoiliac atherosclerotic disease. The IVC is unremarkable. No portal venous gas. Interval decrease in the luminal distension of the common femoral vein seen on the prior CT with DVT. No portal venous gas. Similar appearance of adenopathy in the region of the gastroduodenal ligament. Reproductive: The prostate and seminal vesicles are grossly unremarkable. Other: There is omental nodularity and caking consistent with implants. Musculoskeletal: Mild subcutaneous edema. No acute osseous pathology. IMPRESSION: 1. Interval improvement and decrease in the distension of the stomach compared to the prior CT. A wall stent in the gastric antrum appears in similar position. 2. Hepatic and nodal metastatic disease as seen on the prior CT. 3. Omental nodularity and caking consistent with implants. 4. Small ascites. 5. Small right pleural effusion with associated minimal right lung base compressive atelectasis similar to prior CT. 6. Several small pulmonary nodules as seen on the prior CT and concerning for metastatic disease. 7. Aortic Atherosclerosis (ICD10-I70.0). Electronically Signed   By: Anner Crete M.D.   On: 09/02/2020 16:49    Medications: I have reviewed the patient's current medications.  Assessment/Plan: . 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  CT abdomen[facility]pelvis [facility] 01/03/2020-masslike wall thickening of the gastric antrum, numerous liver metastases, small volume ascites, numerous peritoneal nodules, enlarged periaortic and porta hepatis lymph nodes  Upper endoscopy 01/06/2020-large ulcer in the gastric antrum with heaped up and firm edges, Duodenal bulb largely "obliterated"by the mass, biopsy confirmed adenocarcinoma, HER-2 3+, intact mismatch repair protein expression, PD-L1: 4   CT chest  01/10/2020-acute bilateral pulmonary emboli, no lymphadenopathy or suspicious lung nodules  Baseline echo 01/17/20 at [place], EF 60-65%  CT abdomen/pelvis 01/22/2020, gastric mass, liver metastases, peritoneal/omental nodules, ascites, right pleural effusion  Therapeutic and diagnostic paracentesis on 01/27/2020, 2.1 L removed;cytology shows malignant cells,malignant ascites  PD-L1combined positive score: 1 (from ascites/periteoneal fluid), reported 02/01/20   Cycle 1 FOLFOX with trastuzumab and pembrolizumab, 01/31/2020  Baseline CEA 13.6 (02/08/2020)  Cycle 2 FOLFOX/trastuzumab 02/15/20  Pembrolizumab 02/22/2020  Cycle 3 FOLFOX/trastuzumab 02/28/2020  Cycle 4 FOLFOX/trastuzumab 03/14/2020  Pembrolizumab 03/14/2020  CTs 03/27/2020-multifocal liver metastases again noted, appear mildly decreased in size in the interval. Persistent diffuse peritoneal disease with mild decrease in size of index peritoneal lesions. Similar volume of upper abdominal ascites. Small right pleural effusion is decreased.  Cycle 5 FOLFOX/trastuzumab 03/28/2020  Pembrolizumab 9/22/202  Cycle 6 FOLFOX/trastuzumab 04/10/2020  Pembrolizumab 04/24/2020  Cycle 7 FOLFOX/trastuzumab 04/24/2020  Cycle 8 FOLFOX/trastuzumab 05/08/2020  Pembrolizumab 05/15/2020  CTs 05/21/2020-mildly worsened omental caking of tumor. Mild perihepatic ascites. Marked irregular gastric wall thickening in the gastric antrum traversed by gastric stent.  Cycle95-FU/trastuzumab 05/22/2020  Upper endoscopy 06/04/2020-moderately severe esophagitis; large, fungating polypoid and ulcerated circumferential mass in the gastric antrum, no bleeding/recent bleeding; obvious stenosis of stent with significant tumor ingrowth; stent placed  Cycle 1 Taxol/ramucirumab 06/18/2020  Cycle 2 Taxol/ramucirumab 07/13/2020  MRI brain 07/25/2020-no metastatic disease. Advanced chronic microvascular ischemic changes. Remote lacunar infarct right cerebellar  hemisphere. Left maxillary sinus disease  Cycle 3 ramucirumab 07/26/2020 (  Taxol held due to concern for progressive neuropathy)  Cycle 4 ramucirumab 08/10/2020 (Taxol held)  CT abdomen/pelvis 08/23/2020-no change in masslike thickening of the gastric antrum, increased gastric distention, increased hepatic metastases, increased abdominal lymphadenopathy, new mild ascites, no change in omental carcinomatosis, new bibasilar pulmonary nodules, new bilateral common femoral DVTs  2.Nausea/vomiting secondary to #1 with gastric outlet obstruction  Gastric stent placement 01/12/2020, n/v resolved  Admission with intractable nausea/vomiting 06/02/2020-upper endoscopy 06/04/2000-tumor ingrowth in gastric antrum stent, new stent placed, gastric antrum mass  3.Acute bilateral lower lobe pulmonary emboli on CT 01/10/2020-treated with apixaban,(givenLovenoxduring hospitalization), apixaban discontinued during hospital admission November 2021  4.Anemia-likely secondary to bleeding from the gastric mass, chemotherapy, and chronic disease  5.Status post Port-A-Cath placement 01/13/2020  6.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 Hospital admission 06/02/2020-intractable nausea/vomiting secondary to tumor ingrowth at the gastric pylorus stent  12. Abdominal pain and flushing during Taxol infusion 06/18/2020-resolved after receiving  Benadryl, Pepcid and Solu-Medrol. Taxol resumed and completed. Dexamethasone home premedication added.  13.  Status post evaluation by Dr. 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  14.  Admission 09/02/2020 with severe anemia and nausea/vomiting-likely bleeding from the gastric tumor while on anticoagulation therapy  15.  Hyperbilirubinemia-likely secondary to liver metastases and biliary obstruction by tumor  Drew [name] was admitted yesterday with severe anemia and nausea/vomiting.  He has advanced metastatic gastric cancer.  I recommend comfort care and hospice.  I confirmed his desire for a no CODE BLUE status.  I do not recommend further procedures such as a repeat endoscopy or placement of an IVC filter  His daughter was not present this morning.  He says that she will arrive tomorrow.  I will see Drew [name] again in the morning and discuss disposition options with his daughter.  Recommendations: 1.  Comfort care, hospice referral for home care versus residential hospice 2.  Discontinue nonessential medications, focus on comfort 3.  Discontinue anticoagulation therapy 4.  Blood transfusion for comfort      LOS: 1 day   Betsy Coder, MD   09/03/2020, 1:50 PM

## 2020-09-03 NOTE — Progress Notes (Signed)
PROGRESS NOTE  Drew [name] VQM:[id number] DOB: 1946/05/26   PCP: Gaynelle Arabian, MD  Patient is from: Home  DOA: 09/02/2020 LOS: 1  Chief complaints: Fatigue  Brief Narrative / Interim history: 75 year old M with PMH of metastatic gastric adenocarcinoma, GOO s/p stent, CVA, PE, HTN and GERD presenting with fatigue, altered mental status, DOE and emesis, and admitted for symptomatic anemia, elevated liver enzymes and FTT.  GI and oncology consulted.  Oncology recommended CT abdomen and pelvis followed by MRCP.  CT abdomen and pelvis showed diffuse metastasis to liver, omentum and lungs.  GI recommended palliative care consult after talking to the patient and patient's daughter over the phone.  Of note, his oncologist previously recommended hospice due to progressive disease.    Subjective: Seen and examined earlier this morning.  No major events overnight of this morning. He says he was not able to eat due to "regurgitation".  He thinks there could be some dark blood in emesis. denies chest pain or dyspnea but feels weak and tired.  Objective: Vitals:   09/03/20 1002 09/03/20 1021 09/03/20 1244 09/03/20 1352  BP: 115/67 122/74 (!) 147/83 133/81  Pulse: (!) 101 (!) 103 (!) 101   Resp: 20 20 18 18   Temp: (!) 97.4 F (36.3 C) 97.6 F (36.4 C) 97.7 F (36.5 C) 98 F (36.7 C)  TempSrc: Oral  Oral Oral  SpO2: 100%  100% 100%  Weight:      Height:        Intake/Output Summary (Last 24 hours) at 09/03/2020 1357 Last data filed at 09/03/2020 1244 Gross per 24 hour  Intake 1227 ml  Output 600 ml  Net 627 ml   Filed Weights   09/02/20 1653  Weight: 59.1 kg    Examination:  GENERAL: Chronically ill-appearing.  Frail. HEENT: MMM.  Vision and hearing grossly intact.  NECK: Supple.  No apparent JVD.  RESP: On RA.  No IWOB.  Fair aeration bilaterally. CVS:  RRR. Heart sounds normal.  ABD/GI/GU: BS+. Abd soft, NTND.  MSK/EXT:  Moves extremities. No apparent deformity. No  edema.  SKIN: no apparent skin lesion or wound NEURO: Awake, alert and oriented appropriately.  No apparent focal neuro deficit. PSYCH: Calm. Normal affect.   Procedures:  None  Microbiology summarized: DTOIZ-12 and influenza PCR nonreactive.  Assessment & Plan: Symptomatic blood loss anemia superimposed on anemia of chronic disease Recent Labs    07/12/20 1329 07/18/20 1227 07/26/20 1059 08/10/20 0958 08/24/20 0948 09/02/20 1127 09/02/20 1843 09/03/20 0145 09/03/20 0536 09/03/20 1250  HGB 8.0* 7.8* 8.6* 8.6* 7.7* 5.2* 6.2* 6.3* 5.9* 7.1*  -Transfused 1 unit overnight.  Hemoglobin up to 5.9. -Transfuse 2 more units given ongoing symptoms -Check hemolysis labs -Continue IV PPI -Hold home Eliquis.  Metastatic gastric adenocarcinoma: Progressive despite chemotherapy.  Metastasis to liver, lymph nodes, omentum and lung.  Oncology previously recommended hospice. -Palliative care consulted  Gastric outlet obstruction s/p stent Nausea and vomiting-likely due to cancer angio. -As needed antiemetics -IV fluid  Elevated liver enzymes/alkaline phosphatase/hyperbilirubinemia-likely due to metastatic disease: CT showed intrahepatic biliary duct dilation or periportal edema.  -Per GI, gastric distention precludes ERCP. -GI recommended hospice after talking to patient and family.  Nausea and vomiting: Likely due to the above. -Antiemetics and IV fluid  Failure to thrive: Likely due to metastatic cancer -Supportive care  Physical deconditioning/generalized weakness -PT OT if not pursuing hospice.  Parkinson disease? -Continue home medications.  History of PE -Eliquis on hold.  History of CVA -  Home Eliquis on hold in the setting of symptomatic anemia.  Essential hypertension: Normotensive. -Resume home metoprolol at lower dose  GERD -GI as above  Goal of care-DNR/DNI.  Poor prognosis. -Palliative care consulted   Body mass index is 20.41 kg/m.         DVT  prophylaxis:  SCDs Start: 09/02/20 1755  Code Status: DNR/DNI Family Communication: Updated patient's daughter over the phone. Level of care: Telemetry Status is: Inpatient  Remains inpatient appropriate because:Unsafe d/c plan, IV treatments appropriate due to intensity of illness or inability to take PO and Inpatient level of care appropriate due to severity of illness   Dispo: The patient is from: Home              Anticipated d/c is to: To be determined              Anticipated d/c date is: 2 days              Patient currently is not medically stable to d/c.   Difficult to place patient No       Consultants:  Oncology Gastroenterology Palliative medicine   Sch Meds:  Scheduled Meds: . carbidopa-levodopa  1 tablet Oral TID  . Chlorhexidine Gluconate Cloth  6 each Topical Daily  . [START ON 09/07/2020] pantoprazole  40 mg Intravenous Q12H   Continuous Infusions: . sodium chloride Stopped (09/03/20 1017)  . pantoprozole (PROTONIX) infusion     PRN Meds:.melatonin, ondansetron **OR** ondansetron (ZOFRAN) IV  Antimicrobials: Anti-infectives (From admission, onward)   None       I have personally reviewed the following labs and images: CBC: Recent Labs  Lab 09/02/20 1127 09/02/20 1843 09/03/20 0145 09/03/20 0536 09/03/20 1250  WBC 7.2  --   --   --   --   HGB 5.2* 6.2* 6.3* 5.9* 7.1*  HCT 17.5* 20.6* 20.8* 19.7* 23.0*  MCV 89.7  --   --   --   --   PLT 243  --   --   --   --    BMP &GFR Recent Labs  Lab 09/02/20 1127 09/03/20 0536  NA 136 137  K 3.9 4.3  CL 102 104  CO2 26 25  GLUCOSE 153* 144*  BUN 30* 33*  CREATININE 0.58* 0.62  CALCIUM 8.2* 8.2*   Estimated Creatinine Clearance: 68.7 mL/min (by C-G formula based on SCr of 0.62 mg/dL). Liver & Pancreas: Recent Labs  Lab 09/02/20 1203 09/03/20 0536  AST 214* 195*  ALT 43 74*  ALKPHOS 604* 586*  BILITOT 6.1* 6.6*  PROT 5.3* 4.8*  ALBUMIN 2.4* 2.1*   Recent Labs  Lab  09/02/20 1203  LIPASE 153*   Recent Labs  Lab 09/02/20 1650  AMMONIA 24   Diabetic: No results for input(s): HGBA1C in the last 72 hours. No results for input(s): GLUCAP in the last 168 hours. Cardiac Enzymes: Recent Labs  Lab 09/03/20 1250  CKTOTAL 15*   No results for input(s): PROBNP in the last 8760 hours. Coagulation Profile: Recent Labs  Lab 09/02/20 1203 09/03/20 0536  INR 1.2 1.2   Thyroid Function Tests: No results for input(s): TSH, T4TOTAL, FREET4, T3FREE, THYROIDAB in the last 72 hours. Lipid Profile: No results for input(s): CHOL, HDL, LDLCALC, TRIG, CHOLHDL, LDLDIRECT in the last 72 hours. Anemia Panel: No results for input(s): VITAMINB12, FOLATE, FERRITIN, TIBC, IRON, RETICCTPCT in the last 72 hours. Urine analysis:    Component Value Date/Time   COLORURINE AMBER (A) 09/02/2020  [place] 09/02/2020 1938   LABSPEC >1.046 (H) 09/02/2020 1938   PHURINE 6.0 09/02/2020 1938   GLUCOSEU NEGATIVE 09/02/2020 1938   HGBUR NEGATIVE 09/02/2020 1938   BILIRUBINUR SMALL (A) 09/02/2020 1938   KETONESUR NEGATIVE 09/02/2020 1938   PROTEINUR NEGATIVE 09/02/2020 1938   UROBILINOGEN 0.2 09/25/2011 2206   NITRITE NEGATIVE 09/02/2020 1938   LEUKOCYTESUR NEGATIVE 09/02/2020 1938   Sepsis Labs: Invalid input(s): PROCALCITONIN, [place]  Microbiology: Recent Results (from the past 240 hour(s))  Resp Panel by RT-PCR (Flu A&B, Covid) Nasopharyngeal Swab     Status: None   Collection Time: 09/02/20 12:17 PM   Specimen: Nasopharyngeal Swab; Nasopharyngeal(NP) swabs in vial transport medium  Result Value Ref Range Status   SARS Coronavirus 2 by RT PCR NEGATIVE NEGATIVE Final    Comment: (NOTE) SARS-CoV-2 target nucleic acids are NOT DETECTED.  The SARS-CoV-2 RNA is generally detectable in upper respiratory specimens during the acute phase of infection. The lowest concentration of SARS-CoV-2 viral copies this assay can detect is 138 copies/mL. A  negative result does not preclude SARS-Cov-2 infection and should not be used as the sole basis for treatment or other patient management decisions. A negative result may occur with  improper specimen collection/handling, submission of specimen other than nasopharyngeal swab, presence of viral mutation(s) within the areas targeted by this assay, and inadequate number of viral copies(<138 copies/mL). A negative result must be combined with clinical observations, patient history, and epidemiological information. The expected result is Negative.  Fact Sheet for Patients:  EntrepreneurPulse.com.au  Fact Sheet for Healthcare Providers:  IncredibleEmployment.be  This test is no t yet approved or cleared by the [place] FDA and  has been authorized for detection and/or diagnosis of SARS-CoV-2 by FDA under an Emergency Use Authorization (EUA). This EUA will remain  in effect (meaning this test can be used) for the duration of the COVID-19 declaration under Section 564(b)(1) of the Act, 21 U.S.C.section 360bbb-3(b)(1), unless the authorization is terminated  or revoked sooner.       Influenza A by PCR NEGATIVE NEGATIVE Final   Influenza B by PCR NEGATIVE NEGATIVE Final    Comment: (NOTE) The Xpert Xpress SARS-CoV-2/FLU/RSV plus assay is intended as an aid in the diagnosis of influenza from Nasopharyngeal swab specimens and should not be used as a sole basis for treatment. Nasal washings and aspirates are unacceptable for Xpert Xpress SARS-CoV-2/FLU/RSV testing.  Fact Sheet for Patients: EntrepreneurPulse.com.au  Fact Sheet for Healthcare Providers: IncredibleEmployment.be  This test is not yet approved or cleared by the [place] FDA and has been authorized for detection and/or diagnosis of SARS-CoV-2 by FDA under an Emergency Use Authorization (EUA). This EUA will remain in effect (meaning this test can  be used) for the duration of the COVID-19 declaration under Section 564(b)(1) of the Act, 21 U.S.C. section 360bbb-3(b)(1), unless the authorization is terminated or revoked.  Performed at [facility], [place] [address] [place], [place] [address]     Radiology Studies: CT ABDOMEN PELVIS W CONTRAST  Result Date: 09/02/2020 CLINICAL DATA:  38 male with jaundice. EXAM: CT ABDOMEN AND PELVIS WITH CONTRAST TECHNIQUE: Multidetector CT imaging of the abdomen and pelvis was performed using the standard protocol following bolus administration of intravenous contrast. CONTRAST:  12mL OMNIPAQUE IOHEXOL 300 MG/ML  SOLN COMPARISON:  Abdominal CT dated 08/23/2020 FINDINGS: Lower chest: Small right pleural effusion with associated minimal right lung base compressive atelectasis similar to prior CT. Several small pulmonary nodules as  seen on the prior CT and concerning for metastatic disease. Partially visualized central venous line with tip in the region of the cavoatrial junction. No intra-abdominal free air.  Small ascites. Hepatobiliary: Multiple hepatic hypodense lesions consistent with metastatic disease as seen on the prior CT. There is intrahepatic biliary ductal dilatation or periportal edema. No calcified gallstone. Pancreas: The pancreas is unremarkable as visualized. Spleen: Normal in size without focal abnormality. Adrenals/Urinary Tract: The adrenal glands unremarkable. There is no hydronephrosis on either side. There is symmetric enhancement and excretion of contrast by both kidneys. Multiple small bilateral renal cysts. The visualized ureters and urinary bladder appear unremarkable. Stomach/Bowel: Masslike thickening of the distal stomach as seen previously in keeping with known malignancy. A wall stent in the gastric antrum appears in similar position. Interval improvement and decrease in the distension of the stomach compared to the prior CT. Evaluation of stent patency  or tumor infiltration is very limited on this CT. There is moderate stool throughout the colon. No evidence of bowel obstruction. The appendix is normal. Vascular/Lymphatic: Mild aortoiliac atherosclerotic disease. The IVC is unremarkable. No portal venous gas. Interval decrease in the luminal distension of the common femoral vein seen on the prior CT with DVT. No portal venous gas. Similar appearance of adenopathy in the region of the gastroduodenal ligament. Reproductive: The prostate and seminal vesicles are grossly unremarkable. Other: There is omental nodularity and caking consistent with implants. Musculoskeletal: Mild subcutaneous edema. No acute osseous pathology. IMPRESSION: 1. Interval improvement and decrease in the distension of the stomach compared to the prior CT. A wall stent in the gastric antrum appears in similar position. 2. Hepatic and nodal metastatic disease as seen on the prior CT. 3. Omental nodularity and caking consistent with implants. 4. Small ascites. 5. Small right pleural effusion with associated minimal right lung base compressive atelectasis similar to prior CT. 6. Several small pulmonary nodules as seen on the prior CT and concerning for metastatic disease. 7. Aortic Atherosclerosis (ICD10-I70.0). Electronically Signed   By: Anner Crete M.D.   On: 09/02/2020 16:49      Maksim Peregoy T. Fisher  If 7PM-7AM, please contact night-coverage [email or web address] 09/03/2020, 1:57 PM

## 2020-09-03 NOTE — Consult Note (Signed)
Referring Provider: [facility] Primary Care Physician:  Gaynelle Arabian, MD [facility])  Reason for Consultation:  Abnormal LFTs, anemia  HPI: Drew [name] is a 75 y.o. male with history of metastatic gastric cancer (gastric stent placement 01/2020), PE (on Eliquis, resumed 08/24/2020), anemia, and CVA presenting for consultation of abnormal LFTs.  Patient reports progressive weakness.  Denies any abdominal pain.  He has intermittent nausea/vomiting.  He reports chronically dark stools for many months.  Denies any hematochezia.  Reports continued progressive weight loss and poor appetite.  CT 09/03/20: Interval improvement and decrease in the distension of the stomach compared to the prior CT. A wall stent in the gastric antrum appears in similar position. Hepatic and nodal metastatic disease as seen on the prior CT. Omental nodularity and caking consistent with implants.  Small ascites.  Past Medical History:  Diagnosis Date  . ED (erectile dysfunction)   . gastric ca dx'd 12/2019  . GERD (gastroesophageal reflux disease)   . High cholesterol   . Hypertension   . Hypogonadism male   . Prediabetes   . PUD (peptic ulcer disease)     Past Surgical History:  Procedure Laterality Date  . DUODENAL STENT PLACEMENT N/A 06/04/2020   Procedure: DUODENAL STENT PLACEMENT;  Surgeon: Clarene Essex, MD;  Location: [facility];  Service: Endoscopy;  Laterality: N/A;  . ESOPHAGOGASTRODUODENOSCOPY (EGD) WITH PROPOFOL N/A 06/04/2020   Procedure: ESOPHAGOGASTRODUODENOSCOPY (EGD) WITH PROPOFOL and possible stenting with fluoroscopy;  Surgeon: Clarene Essex, MD;  Location: [facility];  Service: Endoscopy;  Laterality: N/A;  . FINGER AMPUTATION    . TONSILLECTOMY      Prior to Admission medications   Medication Sig Start Date End Date Taking? Authorizing Provider  acetaminophen (TYLENOL) 500 MG tablet Take 1,000 mg by mouth at bedtime.   Yes [provider]  apixaban (ELIQUIS) 2.5 MG TABS tablet Take 1  tablet (2.5 mg total) by mouth daily. 08/24/20  Yes Ladell Pier, MD  atorvastatin (LIPITOR) 10 MG tablet Take 10 mg by mouth daily. 07/27/20  Yes [provider]  baclofen (LIORESAL) 10 MG tablet Take 1 tablet (10 mg total) by mouth 3 (three) times daily as needed for muscle spasms. 06/01/20  Yes Ladell Pier, MD  carbidopa-levodopa (SINEMET IR) 25-100 MG tablet Take 1 tablet by mouth 3 (three) times daily. 08/24/20  Yes Ladell Pier, MD  FEROSUL 325 (65 Fe) MG tablet Take 1 tablet (325 mg total) by mouth daily. 04/10/20  Yes Ladell Pier, MD  metoprolol succinate (TOPROL-XL) 25 MG 24 hr tablet Take 1 tablet (25 mg total) by mouth daily. 05/08/20  Yes Owens Shark, NP  Omega-3 Fatty Acids (FISH OIL) 1000 MG CAPS Take 2 capsules by mouth daily.   Yes [provider]  pantoprazole (PROTONIX) 40 MG tablet Take 1 tablet (40 mg total) by mouth 2 (two) times daily. 05/08/20  Yes Owens Shark, NP  polyethylene glycol (MIRALAX / GLYCOLAX) 17 g packet Take 17 g by mouth daily as needed. Patient taking differently: Take 17 g by mouth daily as needed for mild constipation. 01/29/20  Yes Kc, Maren Beach, MD  potassium chloride SA (KLOR-CON) 20 MEQ tablet TAKE 1 TABLET(20 MEQ) BY MOUTH DAILY Patient taking differently: Take 20 mEq by mouth daily. 07/27/20  Yes Ladell Pier, MD  prochlorperazine (COMPAZINE) 10 MG tablet Take 1 tablet (10 mg total) by mouth every 6 (six) hours as needed for nausea or vomiting. 07/27/20  Yes Ladell Pier, MD  tamsulosin (FLOMAX) 0.4 MG CAPS capsule TAKE 1 CAPSULE(0.4 MG) BY MOUTH DAILY Patient taking differently: Take 0.4 mg by mouth daily. 07/27/20  Yes Ladell Pier, MD  dexamethasone (DECADRON) 2 MG tablet Take 5 tablets (10 mg total) by mouth as directed. Patient not taking: No sig reported 06/19/20   Ladell Pier, MD  diphenoxylate-atropine (LOMOTIL) 2.5-0.025 MG tablet TAKE 1-2 TABLETS BY MOUTH FOUR TIMES DAILY AS NEEDED FOR DIARHEA OR  LOOSE STOOLS Patient not taking: No sig reported 05/02/20   Ladell Pier, MD  lidocaine-prilocaine (EMLA) cream Apply 1 application topically as needed. Patient not taking: No sig reported 01/27/20   Ladell Pier, MD    Scheduled Meds: . sodium chloride   Intravenous Once  . carbidopa-levodopa  1 tablet Oral TID  . Chlorhexidine Gluconate Cloth  6 each Topical Daily   Continuous Infusions: . sodium chloride     PRN Meds:.melatonin, ondansetron **OR** ondansetron (ZOFRAN) IV  Allergies as of 09/02/2020 - Review Complete 09/02/2020  Allergen Reaction Noted  . Atorvastatin Other (See Comments) 02/28/2020  . Lactose intolerance (gi)  09/02/2020  . Pravastatin Other (See Comments) 02/28/2020    Family History  Problem Relation Age of Onset  . Diabetes Other   . Prostate cancer Brother        both brothers    Social History   Socioeconomic History  . Marital status: Married    Spouse name: Not on file  . Number of children: Not on file  . Years of education: Not on file  . Highest education level: Not on file  Occupational History  . Not on file  Tobacco Use  . Smoking status: Former Research scientist (life sciences)  . Smokeless tobacco: Never Used  Vaping Use  . Vaping Use: Never used  Substance and Sexual Activity  . Alcohol use: Yes    Alcohol/week: 10.0 standard drinks    Types: 10 Glasses of wine per week  . Drug use: No  . Sexual activity: Not on file  Other Topics Concern  . Not on file  Social History Narrative  . Not on file   Social Determinants of Health   Financial Resource Strain: Not on file  Food Insecurity: Not on file  Transportation Needs: Not on file  Physical Activity: Not on file  Stress: Not on file  Social Connections: Not on file  Intimate Partner Violence: Not on file    Review of Systems: Review of Systems  Constitutional: Positive for malaise/fatigue and weight loss.  HENT: Negative for sore throat.   Eyes: Negative for pain and redness.   Respiratory: Negative for cough, sputum production and stridor.   Cardiovascular: Negative for chest pain and palpitations.  Gastrointestinal: Positive for melena, nausea and vomiting. Negative for abdominal pain, blood in stool, constipation, diarrhea and heartburn.  Genitourinary: Negative for flank pain and hematuria.  Musculoskeletal: Negative for falls and joint pain.  Skin: Negative for itching and rash.  Neurological: Negative for seizures and loss of consciousness.  Endo/Heme/Allergies: Negative for polydipsia. Does not bruise/bleed easily.  Psychiatric/Behavioral: Negative for substance abuse. The patient is not nervous/anxious.      Physical Exam: Vital signs: Vitals:   09/02/20 2039 09/03/20 0449  BP: (!) 141/83 (!) 149/75  Pulse: 96 (!) 108  Resp: 14 14  Temp: (!) 97.5 F (36.4 C) (!) 97.3 F (36.3 C)  SpO2: 100% 99%   Last BM Date: 08/29/20 Physical Exam Vitals reviewed.  Constitutional:      General: He  is not in acute distress.    Appearance: He is cachectic.  HENT:     Head: Normocephalic and atraumatic.     Nose: Nose normal. No congestion.     Mouth/Throat:     Mouth: Mucous membranes are moist.     Pharynx: Oropharynx is clear.  Eyes:     General: Scleral icterus present.     Extraocular Movements: Extraocular movements intact.  Cardiovascular:     Rate and Rhythm: Normal rate and regular rhythm.     Pulses: Normal pulses.  Pulmonary:     Effort: Pulmonary effort is normal. No respiratory distress.     Breath sounds: Normal breath sounds.  Abdominal:     General: Bowel sounds are normal. There is no distension.     Palpations: Abdomen is soft.     Tenderness: There is no abdominal tenderness. There is no guarding or rebound.     Hernia: No hernia is present.  Musculoskeletal:        General: No swelling or tenderness.     Cervical back: Normal range of motion and neck supple.  Skin:    General: Skin is warm and dry.     Coloration: Skin is  jaundiced.  Neurological:     General: No focal deficit present.     Mental Status: He is oriented to person, place, and time. He is lethargic.  Psychiatric:        Mood and Affect: Mood normal.        Behavior: Behavior normal. Behavior is cooperative.       GI:  Lab Results: Recent Labs    09/02/20 1127 09/02/20 1843 09/03/20 0145 09/03/20 0536  WBC 7.2  --   --   --   HGB 5.2* 6.2* 6.3* 5.9*  HCT 17.5* 20.6* 20.8* 19.7*  PLT 243  --   --   --    BMET Recent Labs    09/02/20 1127 09/03/20 0536  NA 136 137  K 3.9 4.3  CL 102 104  CO2 26 25  GLUCOSE 153* 144*  BUN 30* 33*  CREATININE 0.58* 0.62  CALCIUM 8.2* 8.2*   LFT Recent Labs    09/02/20 1203 09/03/20 0536  PROT 5.3* 4.8*  ALBUMIN 2.4* 2.1*  AST 214* 195*  ALT 43 74*  ALKPHOS 604* 586*  BILITOT 6.1* 6.6*  BILIDIR 4.3*  --   IBILI 1.8*  --    PT/INR Recent Labs    09/02/20 1203 09/03/20 0536  LABPROT 14.7 14.7  INR 1.2 1.2     Studies/Results: DG Chest 1 View  Result Date: 09/02/2020 CLINICAL DATA:  Vomiting. Family reports disorientation. History of stage IV gastric cancer. EXAM: CHEST  1 VIEW COMPARISON:  02/13/2020 FINDINGS: Right chest wall port a catheter is noted with tip at the cavoatrial junction. The heart size appears normal. No pleural effusion or edema identified. No airspace consolidation. IMPRESSION: No acute cardiopulmonary abnormalities. Electronically Signed   By: Kerby Moors M.D.   On: 09/02/2020 12:30   CT ABDOMEN PELVIS W CONTRAST  Result Date: 09/02/2020 CLINICAL DATA:  47 male with jaundice. EXAM: CT ABDOMEN AND PELVIS WITH CONTRAST TECHNIQUE: Multidetector CT imaging of the abdomen and pelvis was performed using the standard protocol following bolus administration of intravenous contrast. CONTRAST:  122mL OMNIPAQUE IOHEXOL 300 MG/ML  SOLN COMPARISON:  Abdominal CT dated 08/23/2020 FINDINGS: Lower chest: Small right pleural effusion with associated minimal  right lung base compressive atelectasis similar to prior CT.  Several small pulmonary nodules as seen on the prior CT and concerning for metastatic disease. Partially visualized central venous line with tip in the region of the cavoatrial junction. No intra-abdominal free air.  Small ascites. Hepatobiliary: Multiple hepatic hypodense lesions consistent with metastatic disease as seen on the prior CT. There is intrahepatic biliary ductal dilatation or periportal edema. No calcified gallstone. Pancreas: The pancreas is unremarkable as visualized. Spleen: Normal in size without focal abnormality. Adrenals/Urinary Tract: The adrenal glands unremarkable. There is no hydronephrosis on either side. There is symmetric enhancement and excretion of contrast by both kidneys. Multiple small bilateral renal cysts. The visualized ureters and urinary bladder appear unremarkable. Stomach/Bowel: Masslike thickening of the distal stomach as seen previously in keeping with known malignancy. A wall stent in the gastric antrum appears in similar position. Interval improvement and decrease in the distension of the stomach compared to the prior CT. Evaluation of stent patency or tumor infiltration is very limited on this CT. There is moderate stool throughout the colon. No evidence of bowel obstruction. The appendix is normal. Vascular/Lymphatic: Mild aortoiliac atherosclerotic disease. The IVC is unremarkable. No portal venous gas. Interval decrease in the luminal distension of the common femoral vein seen on the prior CT with DVT. No portal venous gas. Similar appearance of adenopathy in the region of the gastroduodenal ligament. Reproductive: The prostate and seminal vesicles are grossly unremarkable. Other: There is omental nodularity and caking consistent with implants. Musculoskeletal: Mild subcutaneous edema. No acute osseous pathology. IMPRESSION: 1. Interval improvement and decrease in the distension of the stomach compared to the  prior CT. A wall stent in the gastric antrum appears in similar position. 2. Hepatic and nodal metastatic disease as seen on the prior CT. 3. Omental nodularity and caking consistent with implants. 4. Small ascites. 5. Small right pleural effusion with associated minimal right lung base compressive atelectasis similar to prior CT. 6. Several small pulmonary nodules as seen on the prior CT and concerning for metastatic disease. 7. Aortic Atherosclerosis (ICD10-I70.0). Electronically Signed   By: Anner Crete M.D.   On: 09/02/2020 16:49    Impression: Acute on chronic anemia in the setting of gastric cancer and Eliquis use due to PE -Hemoglobin 5.9, decreased from 7.7 on 08/24/2020 -BUN elevated to 33 with Cr 0.62, suggestive of upper GI bleeding -Normal INR 1.2  Abnormal LFTs: due to hepatic metastases. T. Bili 6.6/ AST 195/ ALT 74/ ALP 586  Plan: Protonix drip x 72 hours, then IV BID thereafter.  Hold Eliquis, likely indefinitely, given gastric mass, as he is at risk of hemorrhage.  Continue to monitor H&H with transfusion as needed to maintain Hgb >7.  Palliative care consult.    LOS: 1 day   Salley Slaughter  PA-C 09/03/2020, 8:46 AM  Contact #  [phone]

## 2020-09-03 NOTE — Progress Notes (Signed)
   09/03/20 2107  Assess: MEWS Score  Temp 97.8 F (36.6 C)  BP (!) 149/87  Pulse Rate (!) 113  Level of Consciousness Alert  SpO2 100 %  O2 Device Room Air  Assess: MEWS Score  MEWS Temp 0  MEWS Systolic 0  MEWS Pulse 2  MEWS RR 0  MEWS LOC 0  MEWS Score 2  MEWS Score Color Yellow  Assess: if the MEWS score is Yellow or Red  Were vital signs taken at a resting state? Yes (Pt with severe hiccups and nausea)  Focused Assessment No change from prior assessment  Early Detection of Sepsis Score *See Row Information* Low  MEWS guidelines implemented *See Row Information* Yes  Treat  MEWS Interventions Administered scheduled meds/treatments (Compazine)  Pain Scale 0-10  Pain Score 0  Complains of Nausea /  Vomiting;Withdrawal symptoms (hiccups)  Interventions Medication (see MAR)  Take Vital Signs  Increase Vital Sign Frequency  Yellow: Q 2hr X 2 then Q 4hr X 2, if remains yellow, continue Q 4hrs  Escalate  MEWS: Escalate Yellow: discuss with charge nurse/RN and consider discussing with provider and RRT  Notify: Charge Nurse/RN  Name of Charge Nurse/RN Notified Rocco Serene, Therapist, sports.  Date Charge Nurse/RN Notified 09/03/20  Time Charge Nurse/RN Notified 2109

## 2020-09-03 NOTE — Progress Notes (Signed)
2 hours post blood transfusion hemoglobin and hematocrit specimen drawn and sent to lab, results pending.

## 2020-09-03 NOTE — Progress Notes (Signed)
Notified by Charge RN that central Telemetry had reported runs of VTach by pt. Upon verification with CCMD, what was mentioned to be VTach were  actually artifacts on the monitor. Vitals assessed. Pt found in yellow MEWS with tachycardia and elevated Bp. Also reported some nausea.  Medicated per order. Pt with frequent chest movements related to hiccups. MEWS guidelines implemented. Will continue to monitor pt.  VWilliams,RN.

## 2020-09-03 NOTE — Progress Notes (Addendum)
Despite administration of IV Zofran patient is still with complaints of nausea. Patient vomited 1400 ml of dark/black emesis. After episode of emesis patient reported that he feels much better. MD notified of emesis episode. No new orders at this time. Will continue with current treatment plan and update as necessary.

## 2020-09-04 ENCOUNTER — Telehealth: Payer: Self-pay | Admitting: *Deleted

## 2020-09-04 DIAGNOSIS — D649 Anemia, unspecified: Secondary | ICD-10-CM | POA: Diagnosis not present

## 2020-09-04 LAB — TYPE AND SCREEN
ABO/RH(D): O POS
Antibody Screen: NEGATIVE
Unit division: 0
Unit division: 0
Unit division: 0

## 2020-09-04 LAB — BPAM RBC
Blood Product Expiration Date: 202203232359
Blood Product Expiration Date: 202203232359
Blood Product Expiration Date: 202203232359
ISSUE DATE / TIME: 202202201357
ISSUE DATE / TIME: 202202211001
ISSUE DATE / TIME: 202202211358
Unit Type and Rh: 5100
Unit Type and Rh: 5100
Unit Type and Rh: 5100

## 2020-09-04 LAB — HAPTOGLOBIN: Haptoglobin: 80 mg/dL (ref 34–355)

## 2020-09-04 MED ORDER — PROCHLORPERAZINE EDISYLATE 10 MG/2ML IJ SOLN
10.0000 mg | Freq: Three times a day (TID) | INTRAMUSCULAR | Status: DC
  Administered 2020-09-04 – 2020-09-05 (×2): 10 mg via INTRAVENOUS
  Filled 2020-09-04 (×2): qty 2

## 2020-09-04 MED ORDER — ONDANSETRON HCL 4 MG/2ML IJ SOLN: 4.0000 mg | Freq: Four times a day (QID) | INTRAMUSCULAR | Status: DC | PRN

## 2020-09-04 NOTE — TOC Initial Note (Addendum)
Transition of Care [facility]) - Initial/Assessment Note    Patient Details  Name: Drew [name] MRN: [id number] Date of Birth: 08-28-1945  [facility] CM/SW Contact:    Lynnell Catalan, RN Phone Number: 09/04/2020, 2:20 PM  Clinical Narrative:                 [facility] consult for [facility]. Met with pt and daughter Lacretia Leigh at bedside for dc planning. Choice offered for home hospice and [facility] chosen. [facility] liaison contacted for referral. Daughter says they have all DME needed except an over bed table.  Expected Discharge Plan: Home w Hospice Care Barriers to Discharge: Continued Medical Work up  Expected Discharge Plan and Services Expected Discharge Plan: [place]   Discharge Planning Services: CM Consult   Living arrangements for the past 2 months: Single Family Home                    HH Arranged: [facility] [place] Agency: Hospice and [facility] Date [facility] Agency Contacted: 09/04/20 Time HH Agency Contacted: 57 Representative spoke with at [place]: [place] Arrangements/Services Living arrangements for the past 2 months: [place] with:: Spouse Patient language and need for interpreter reviewed:: Yes Do you feel safe going back to the place where you live?: Yes      Need for Family Participation in Patient Care: Yes (Comment) Care giver support system in place?: Yes (comment) Current home services: [facility] Criminal Activity/Legal Involvement Pertinent to Current Situation/Hospitalization: No - Comment as needed  Activities of Daily Living Home Assistive Devices/Equipment: Wheelchair ADL Screening (condition at time of admission) Patient's cognitive ability adequate to safely complete daily activities?: No Is the patient deaf or have difficulty hearing?: Yes Does the patient have difficulty seeing, even when wearing glasses/contacts?: No Does the patient have difficulty  concentrating, remembering, or making decisions?: Yes Patient able to express need for assistance with ADLs?: No Does the patient have difficulty dressing or bathing?: Yes Independently performs ADLs?: No Communication: Dependent Is this a change from baseline?: Pre-admission baseline Dressing (OT): Dependent Is this a change from baseline?: Pre-admission baseline Grooming: Dependent Is this a change from baseline?: Pre-admission baseline Feeding: Needs assistance Is this a change from baseline?: Pre-admission baseline Bathing: Dependent Is this a change from baseline?: Pre-admission baseline Toileting: Dependent Is this a change from baseline?: Pre-admission baseline In/Out Bed: Dependent Is this a change from baseline?: Pre-admission baseline Walks in Home: Dependent Is this a change from baseline?: Pre-admission baseline Does the patient have difficulty walking or climbing stairs?: Yes Weakness of Legs: Both Weakness of Arms/Hands: Both  Permission Sought/Granted   Permission granted to share information with : Yes, Verbal Permission Granted     Permission granted to share info w AGENCY: [facility]        Emotional Assessment Appearance:: Appears stated age Attitude/Demeanor/Rapport: Gracious Affect (typically observed): Calm Orientation: : Oriented to Self,Oriented to Place,Oriented to  Time,Oriented to Situation Alcohol / Substance Use: Not Applicable    Admission diagnosis:  Hyperbilirubinemia [E80.6] Jaundice [R17] Elevated lipase [R74.8] Symptomatic anemia [D64.9] Acute on chronic anemia [D64.9] Patient Active Problem List   Diagnosis Date Noted  . Symptomatic anemia 09/02/2020  . GIB (gastrointestinal bleeding) 06/02/2020  . Port-A-Cath in place 01/31/2020  . Abdominal distention   . Palliative care by specialist   . DNR (do not resuscitate)   . DNR (do not resuscitate) discussion   .  Advanced care planning/counseling discussion   . Goals of care,  counseling/discussion 01/26/2020  . Sepsis ([place]) 01/22/2020  . CVA (cerebral vascular accident) ([place]) 01/20/2020  . Gastric outlet obstruction 01/20/2020  . Pulmonary embolism ([place]) 01/20/2020  . Severe protein-calorie malnutrition ([place]) 01/20/2020  . 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 Iron deficiency 01/06/2020  . Acute blood loss anemia 01/03/2020  . Acute upper GI bleed 01/03/2020  . Metastasis ([place]) 01/03/2020  . Pleural effusion, right 01/03/2020   PCP:  Gaynelle Arabian, MD Pharmacy:   [facility], [address] [place] [address] Phone: [phone] Fax: [phone]     Social Determinants of Health (SDOH) Interventions    Readmission Risk Interventions Readmission Risk Prevention Plan 09/04/2020  Transportation Screening Complete  Medication Review ([place]) Complete  PCP or Specialist appointment within 3-5 days of discharge Complete  HRI or [place] Complete  SW Recovery Care/Counseling Consult Complete  Palliative Care Screening Complete  [place] Not Applicable  Some recent data might be hidden

## 2020-09-04 NOTE — Progress Notes (Signed)
PROGRESS NOTE  Drew [name] NUU:[id number] DOB: [birth year]   PCP: Gaynelle Arabian, MD  Patient is from: Home  DOA: 09/02/2020 LOS: 2  Chief complaints: Fatigue  Brief Narrative / Interim history: 75 year old M with PMH of metastatic gastric adenocarcinoma, GOO s/p stent, CVA, PE, HTN and GERD presenting with fatigue, altered mental status, DOE and emesis, and admitted for symptomatic anemia, elevated liver enzymes and FTT.  GI and oncology consulted.  Oncology recommended CT abdomen and pelvis followed by MRCP.  CT abdomen and pelvis showed diffuse metastasis to liver, omentum and lungs.  GI and oncology met with patient and patient's family (daughter), and recommended hospice.  Plan is discharged home with home hospice on 2/23.   Subjective: Seen and examined earlier this morning.  No major events overnight of this morning.  No complaints.  No further nausea or vomiting.  He denies pain or shortness of breath.  Objective: Vitals:   09/04/20 0059 09/04/20 0551 09/04/20 0944 09/04/20 1353  BP: 121/78 126/68 114/68 117/74  Pulse: (!) 102 95 90 89  Resp: 16 16  16   Temp: 98.3 F (36.8 C) 98.3 F (36.8 C) 98.1 F (36.7 C) 97.7 F (36.5 C)  TempSrc: Oral Oral Oral Oral  SpO2: 99% 100% 100% 100%  Weight:      Height:        Intake/Output Summary (Last 24 hours) at 09/04/2020 1647 Last data filed at 09/04/2020 1354 Gross per 24 hour  Intake 2328.67 ml  Output -  Net 2328.67 ml   Filed Weights   09/02/20 1653  Weight: 59.1 kg    Examination:  GENERAL: Chronically ill-appearing.  Frail. HEENT: MMM.  Vision and hearing grossly intact.  NECK: Supple.  No apparent JVD.  RESP: On RA.  No IWOB.  Fair aeration bilaterally. CVS:  RRR. Heart sounds normal.  ABD/GI/GU: BS+. Abd soft, NTND.  MSK/EXT:  Moves extremities. No apparent deformity. No edema.  SKIN: no apparent skin lesion or wound NEURO: Awake, alert and oriented appropriately.  No apparent focal neuro  deficit. PSYCH: Calm. Normal affect.  Procedures:  None  Microbiology summarized: [id number] and influenza PCR nonreactive.  Assessment & Plan: Symptomatic blood loss anemia superimposed on anemia of chronic disease due to upper GI bleed-had hematemesis.  Transfused 3 units.  Metastatic gastric adenocarcinoma: Progressive despite chemotherapy.  Metastasis to liver, lymph nodes, omentum and lung. -No further chemo.  Oncology recommended hospice.  Gastric outlet obstruction s/p stent Nausea and vomiting-likely due to cancer angio.  Resolved with scheduled Zofran as needed Compazine. -Continue IV fluid while here  Elevated liver enzymes/alkaline phosphatase/hyperbilirubinemia-likely due to metastatic disease: CT showed intrahepatic biliary duct dilation or periportal edema.  -Per GI, gastric distention precludes ERCP recommended hospice.  Failure to thrive: Likely due to metastatic cancer -Supportive care  Physical deconditioning/generalized weakness  Parkinson disease? -Continue home medications.  History of PE-Eliquis discontinued  History of CVA  Essential hypertension: Normotensive. -Continue home metoprolol at lower dose  GERD -Continue PPI  Goal of care-DNR/DNI.  Poor prognosis. -Plan for discharge home with home hospice   Body mass index is 20.41 kg/m.         DVT prophylaxis:  SCDs Start: 09/02/20 1755  Code Status: DNR/DNI Family Communication: Updated patient's daughter at bedside Level of care: Telemetry Status is: Inpatient  Remains inpatient appropriate because:Unsafe d/c plan, IV treatments appropriate due to intensity of illness or inability to take PO and Inpatient level of care appropriate due to severity of illness  Dispo: The patient is from: Home              Anticipated d/c is to: Home with home hospice              Anticipated d/c date is: 1 day              Patient currently is medically stable to d/c.   Difficult to place patient  No       Consultants:  Oncology Gastroenterology Palliative medicine   Sch Meds:  Scheduled Meds: . carbidopa-levodopa  1 tablet Oral TID  . Chlorhexidine Gluconate Cloth  6 each Topical Daily  . metoprolol tartrate  12.5 mg Oral BID  . ondansetron (ZOFRAN) IV  4 mg Intravenous Q6H  . [START ON 09/07/2020] pantoprazole  40 mg Intravenous Q12H   Continuous Infusions: . sodium chloride 75 mL/hr at 09/04/20 0549  . pantoprozole (PROTONIX) infusion 8 mg/hr (09/04/20 0748)   PRN Meds:.melatonin, prochlorperazine  Antimicrobials: Anti-infectives (From admission, onward)   None       I have personally reviewed the following labs and images: CBC: Recent Labs  Lab 09/02/20 1127 09/02/20 1843 09/03/20 0145 09/03/20 0536 09/03/20 1250 09/03/20 1835  WBC 7.2  --   --   --   --   --   HGB 5.2* 6.2* 6.3* 5.9* 7.1* 8.5*  HCT 17.5* 20.6* 20.8* 19.7* 23.0* 26.5*  MCV 89.7  --   --   --   --   --   PLT 243  --   --   --   --   --    BMP &GFR Recent Labs  Lab 09/02/20 1127 09/03/20 0536  NA 136 137  K 3.9 4.3  CL 102 104  CO2 26 25  GLUCOSE 153* 144*  BUN 30* 33*  CREATININE 0.58* 0.62  CALCIUM 8.2* 8.2*   Estimated Creatinine Clearance: 68.7 mL/min (by C-G formula based on SCr of 0.62 mg/dL). Liver & Pancreas: Recent Labs  Lab 09/02/20 1203 09/03/20 0536  AST 214* 195*  ALT 43 74*  ALKPHOS 604* 586*  BILITOT 6.1* 6.6*  PROT 5.3* 4.8*  ALBUMIN 2.4* 2.1*   Recent Labs  Lab 09/02/20 1203  LIPASE 153*   Recent Labs  Lab 09/02/20 1650  AMMONIA 24   Diabetic: No results for input(s): HGBA1C in the last 72 hours. No results for input(s): GLUCAP in the last 168 hours. Cardiac Enzymes: Recent Labs  Lab 09/03/20 1250  CKTOTAL 15*   No results for input(s): PROBNP in the last 8760 hours. Coagulation Profile: Recent Labs  Lab 09/02/20 1203 09/03/20 0536  INR 1.2 1.2   Thyroid Function Tests: No results for input(s): TSH, T4TOTAL, FREET4,  T3FREE, THYROIDAB in the last 72 hours. Lipid Profile: No results for input(s): CHOL, HDL, LDLCALC, TRIG, CHOLHDL, LDLDIRECT in the last 72 hours. Anemia Panel: No results for input(s): VITAMINB12, FOLATE, FERRITIN, TIBC, IRON, RETICCTPCT in the last 72 hours. Urine analysis:    Component Value Date/Time   COLORURINE AMBER (A) 09/02/2020 1938   APPEARANCEUR CLEAR 09/02/2020 1938   LABSPEC >1.046 (H) 09/02/2020 1938   PHURINE 6.0 09/02/2020 1938   GLUCOSEU NEGATIVE 09/02/2020 1938   HGBUR NEGATIVE 09/02/2020 1938   BILIRUBINUR SMALL (A) 09/02/2020 1938   KETONESUR NEGATIVE 09/02/2020 1938   PROTEINUR NEGATIVE 09/02/2020 1938   UROBILINOGEN 0.2 09/25/2011 2206   NITRITE NEGATIVE 09/02/2020 1938   LEUKOCYTESUR NEGATIVE 09/02/2020 1938   Sepsis Labs: Invalid input(s): PROCALCITONIN, LACTICIDVEN  Microbiology: Recent Results (from the past 240 hour(s))  Resp Panel by RT-PCR (Flu A&B, Covid) Nasopharyngeal Swab     Status: None   Collection Time: 09/02/20 12:17 PM   Specimen: Nasopharyngeal Swab; Nasopharyngeal(NP) swabs in vial transport medium  Result Value Ref Range Status   SARS Coronavirus 2 by RT PCR NEGATIVE NEGATIVE Final    Comment: (NOTE) SARS-CoV-2 target nucleic acids are NOT DETECTED.  The SARS-CoV-2 RNA is generally detectable in upper respiratory specimens during the acute phase of infection. The lowest concentration of SARS-CoV-2 viral copies this assay can detect is 138 copies/mL. A negative result does not preclude SARS-Cov-2 infection and should not be used as the sole basis for treatment or other patient management decisions. A negative result may occur with  improper specimen collection/handling, submission of specimen other than nasopharyngeal swab, presence of viral mutation(s) within the areas targeted by this assay, and inadequate number of viral copies(<138 copies/mL). A negative result must be combined with clinical observations, patient history, and  epidemiological information. The expected result is Negative.  Fact Sheet for Patients:  EntrepreneurPulse.com.au  Fact Sheet for Healthcare Providers:  IncredibleEmployment.be  This test is no t yet approved or cleared by the [place] FDA and  has been authorized for detection and/or diagnosis of SARS-CoV-2 by FDA under an Emergency Use Authorization (EUA). This EUA will remain  in effect (meaning this test can be used) for the duration of the COVID-19 declaration under Section 564(b)(1) of the Act, 21 U.S.C.section 360bbb-3(b)(1), unless the authorization is terminated  or revoked sooner.       Influenza A by PCR NEGATIVE NEGATIVE Final   Influenza B by PCR NEGATIVE NEGATIVE Final    Comment: (NOTE) The Xpert Xpress SARS-CoV-2/FLU/RSV plus assay is intended as an aid in the diagnosis of influenza from Nasopharyngeal swab specimens and should not be used as a sole basis for treatment. Nasal washings and aspirates are unacceptable for Xpert Xpress SARS-CoV-2/FLU/RSV testing.  Fact Sheet for Patients: EntrepreneurPulse.com.au  Fact Sheet for Healthcare Providers: IncredibleEmployment.be  This test is not yet approved or cleared by the [place] FDA and has been authorized for detection and/or diagnosis of SARS-CoV-2 by FDA under an Emergency Use Authorization (EUA). This EUA will remain in effect (meaning this test can be used) for the duration of the COVID-19 declaration under Section 564(b)(1) of the Act, 21 U.S.C. section 360bbb-3(b)(1), unless the authorization is terminated or revoked.  Performed at [facility], [place] [address], [place], [place] [address]     Radiology Studies: No results found.    Taye T. Barstow  If 7PM-7AM, please contact night-coverage [email or web address] 09/04/2020, 4:47 PM

## 2020-09-04 NOTE — Progress Notes (Signed)
Palliative Care Brief Note  Palliative care consult received.  Chart reviewed.  Drew [name] is planning to transition home with hospice services through [facility].  He has discussed this with Dr. Benay Spice who will serve as his hospice attending.  I discussed with [name] team as well as hospice liaison.  As goals are clear to transition home with hospice support and there no current symptom management needs, will hold on formal consult at this time.  Please call if there are palliative specific needs with which our team can be of assistance and we will be happy to engage at that time.  Micheline Rough, MD [facility] Team [phone]  NO CHARGE NOTE

## 2020-09-04 NOTE — Telephone Encounter (Signed)
Call from Hospice regarding request for hospice care from family in hospital. Asking if Dr. Benay Spice will be attending. Informed her yes.

## 2020-09-04 NOTE — Progress Notes (Addendum)
Manufacturing engineer [facility]) Hospital Liaison: RN note     Notified by Transition of Care Manger, Marney Doctor, RN of patient/family request for [facility] services at home after discharge. Chart and patient information reviewed by [facility] physician. Hospice eligibility confirmed.     Writer spoke with daughter, Magdalene River  to initiate education related to hospice philosophy, services and team approach to care. Tene verbalized understanding of information given. Per discussion, plan is for discharge to home by PTAR.   Please send signed and completed DNR form home with patient/family. Patient will need prescriptions for discharge comfort medications.      DME needs have been discussed, patient currently has the following equipment in the home: hospital bed.  Patient/family requests the following DME for delivery to the home: over bed table. [place] equipment manager has been notified and will contact DME provider to arrange delivery to the home. Home address has been verified and is correct in the chart. Magdalene River is the family member to contact to arrange time of delivery.      [facility] aware of the above. Please notify [facility] when patient is ready to leave the unit at discharge. (Call [phone] or [phone] after 5pm.) [facility] information and contact numbers given to Tene.       A Please do not hesitate to call with questions.     Thank you,     Farrel Gordon, RN, [place] (listed on [facility] under [place])      [phone]

## 2020-09-04 NOTE — Progress Notes (Addendum)
IP PROGRESS NOTE  Subjective:   Drew [name] is resting quietly this morning.  He had some nausea vomiting yesterday.  None reported today.  No bleeding reported by nursing.  Objective: Vital signs in last 24 hours: Blood pressure 114/68, pulse 90, temperature 98.1 F (36.7 C), temperature source Oral, resp. rate 16, height _0  (1.702 m), weight 59.1 kg, SpO2 100 %.  Intake/Output from previous day: 02/21 0701 - 02/22 0700 In: 3615.7 [P.O.:120; I.V.:2088.7; ZOXWR:6045] Out: 1600 [Urine:200; Emesis/NG output:1400]  Physical Exam:  HEENT: No thrush Lungs: Decreased breath sounds at the right compared to the left chest, no respiratory distress Cardiac: Regular rate and rhythm Abdomen: Mildly distended, nontender, no mass Extremities: No leg edema   Portacath/PICC-without erythema  Lab Results: Recent Labs    09/02/20 1127 09/02/20 1843 09/03/20 1250 09/03/20 1835  WBC 7.2  --   --   --   HGB 5.2*   < > 7.1* 8.5*  HCT 17.5*   < > 23.0* 26.5*  PLT 243  --   --   --    < > = values in this interval not displayed.    BMET Recent Labs    09/02/20 1127 09/03/20 0536  NA 136 137  K 3.9 4.3  CL 102 104  CO2 26 25  GLUCOSE 153* 144*  BUN 30* 33*  CREATININE 0.58* 0.62  CALCIUM 8.2* 8.2*    Lab Results  Component Value Date   CEA1 14.92 (H) 04/24/2020    Studies/Results: DG Chest 1 View  Result Date: 09/02/2020 CLINICAL DATA:  Vomiting. Family reports disorientation. History of stage IV gastric cancer. EXAM: CHEST  1 VIEW COMPARISON:  02/13/2020 FINDINGS: Right chest wall port a catheter is noted with tip at the cavoatrial junction. The heart size appears normal. No pleural effusion or edema identified. No airspace consolidation. IMPRESSION: No acute cardiopulmonary abnormalities. Electronically Signed   By: Kerby Moors M.D.   On: 09/02/2020 12:30   CT ABDOMEN PELVIS W CONTRAST  Result Date: 09/02/2020 CLINICAL DATA:  26 male with jaundice. EXAM: CT  ABDOMEN AND PELVIS WITH CONTRAST TECHNIQUE: Multidetector CT imaging of the abdomen and pelvis was performed using the standard protocol following bolus administration of intravenous contrast. CONTRAST:  179m OMNIPAQUE IOHEXOL 300 MG/ML  SOLN COMPARISON:  Abdominal CT dated 08/23/2020 FINDINGS: Lower chest: Small right pleural effusion with associated minimal right lung base compressive atelectasis similar to prior CT. Several small pulmonary nodules as seen on the prior CT and concerning for metastatic disease. Partially visualized central venous line with tip in the region of the cavoatrial junction. No intra-abdominal free air.  Small ascites. Hepatobiliary: Multiple hepatic hypodense lesions consistent with metastatic disease as seen on the prior CT. There is intrahepatic biliary ductal dilatation or periportal edema. No calcified gallstone. Pancreas: The pancreas is unremarkable as visualized. Spleen: Normal in size without focal abnormality. Adrenals/Urinary Tract: The adrenal glands unremarkable. There is no hydronephrosis on either side. There is symmetric enhancement and excretion of contrast by both kidneys. Multiple small bilateral renal cysts. The visualized ureters and urinary bladder appear unremarkable. Stomach/Bowel: Masslike thickening of the distal stomach as seen previously in keeping with known malignancy. A wall stent in the gastric antrum appears in similar position. Interval improvement and decrease in the distension of the stomach compared to the prior CT. Evaluation of stent patency or tumor infiltration is very limited on this CT. There is moderate stool throughout the colon. No evidence of bowel obstruction. The appendix  is normal. Vascular/Lymphatic: Mild aortoiliac atherosclerotic disease. The IVC is unremarkable. No portal venous gas. Interval decrease in the luminal distension of the common femoral vein seen on the prior CT with DVT. No portal venous gas. Similar appearance of  adenopathy in the region of the gastroduodenal ligament. Reproductive: The prostate and seminal vesicles are grossly unremarkable. Other: There is omental nodularity and caking consistent with implants. Musculoskeletal: Mild subcutaneous edema. No acute osseous pathology. IMPRESSION: 1. Interval improvement and decrease in the distension of the stomach compared to the prior CT. A wall stent in the gastric antrum appears in similar position. 2. Hepatic and nodal metastatic disease as seen on the prior CT. 3. Omental nodularity and caking consistent with implants. 4. Small ascites. 5. Small right pleural effusion with associated minimal right lung base compressive atelectasis similar to prior CT. 6. Several small pulmonary nodules as seen on the prior CT and concerning for metastatic disease. 7. Aortic Atherosclerosis (ICD10-I70.0). Electronically Signed   By: Anner Crete M.D.   On: 09/02/2020 16:49    Medications: I have reviewed the patient's current medications.  Assessment/Plan: . 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  [facility]vis [facility] 01/03/2020-masslike wall thickening of the gastric antrum, numerous liver metastases, small volume ascites, numerous peritoneal nodules, enlarged periaortic and porta hepatis lymph nodes  Upper endoscopy 01/06/2020-large ulcer in the gastric antrum with heaped up and firm edges, Duodenal bulb largely "obliterated"by the mass, biopsy confirmed adenocarcinoma, HER-2 3+, intact mismatch repair protein expression, PD-L1: 4   CT chest 01/10/2020-acute bilateral pulmonary emboli, no lymphadenopathy or suspicious lung nodules  Baseline echo 01/17/20 at [facility], EF 60-65%  CT abdomen/pelvis 01/22/2020, gastric mass, liver metastases, peritoneal/omental nodules, ascites, right pleural effusion  Therapeutic and diagnostic paracentesis on 01/27/2020, 2.1 L removed;cytology shows malignant cells,malignant ascites  PD-L1combined  positive score: 1 (from ascites/periteoneal fluid), reported 02/01/20   Cycle 1 FOLFOX with trastuzumab and pembrolizumab, 01/31/2020  Baseline CEA 13.6 (02/08/2020)  Cycle 2 FOLFOX/trastuzumab 02/15/20  Pembrolizumab 02/22/2020  Cycle 3 FOLFOX/trastuzumab 02/28/2020  Cycle 4 FOLFOX/trastuzumab 03/14/2020  Pembrolizumab 03/14/2020  CTs 03/27/2020-multifocal liver metastases again noted, appear mildly decreased in size in the interval. Persistent diffuse peritoneal disease with mild decrease in size of index peritoneal lesions. Similar volume of upper abdominal ascites. Small right pleural effusion is decreased.  Cycle 5 FOLFOX/trastuzumab 03/28/2020  Pembrolizumab 9/22/202  Cycle 6 FOLFOX/trastuzumab 04/10/2020  Pembrolizumab 04/24/2020  Cycle 7 FOLFOX/trastuzumab 04/24/2020  Cycle 8 FOLFOX/trastuzumab 05/08/2020  Pembrolizumab 05/15/2020  CTs 05/21/2020-mildly worsened omental caking of tumor. Mild perihepatic ascites. Marked irregular gastric wall thickening in the gastric antrum traversed by gastric stent.  Cycle95-FU/trastuzumab 05/22/2020  Upper endoscopy 06/04/2020-moderately severe esophagitis; large, fungating polypoid and ulcerated circumferential mass in the gastric antrum, no bleeding/recent bleeding; obvious stenosis of stent with significant tumor ingrowth; stent placed  Cycle 1 Taxol/ramucirumab 06/18/2020  Cycle 2 Taxol/ramucirumab 07/13/2020  MRI brain 07/25/2020-no metastatic disease. Advanced chronic microvascular ischemic changes. Remote lacunar infarct right cerebellar hemisphere. 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, new bibasilar pulmonary nodules, new bilateral common femoral DVTs  2.Nausea/vomiting secondary to #1 with gastric outlet obstruction  Gastric stent placement 01/12/2020, n/v resolved  Admission with intractable nausea/vomiting 06/02/2020-upper endoscopy 06/04/2000-tumor ingrowth in  gastric antrum stent, new stent placed, gastric antrum mass  3.Acute bilateral lower lobe pulmonary emboli on CT 01/10/2020-treated with apixaban,(givenLovenoxduring hospitalization), apixaban discontinued during hospital admission November 2021  4.Anemia-likely secondary to bleeding from the gastric mass, chemotherapy, and chronic disease  5.Status post Port-A-Cath placement 01/13/2020  6.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 Hospital admission 06/02/2020-intractable nausea/vomiting secondary to tumor ingrowth at the gastric pylorus stent  12. Abdominal pain and flushing during Taxol infusion 06/18/2020-resolved after receiving Benadryl, Pepcid and Solu-Medrol. Taxol resumed and completed. Dexamethasone home premedication added.  13.  Status post evaluation by Dr. 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  14.  Admission 09/02/2020 with severe anemia and nausea/vomiting-likely bleeding from the gastric tumor while on anticoagulation therapy  15.   Hyperbilirubinemia-likely secondary to liver metastases and biliary obstruction by tumor  Mr. Acre appears unchanged. He has advanced metastatic gastric cancer.  Confirm desire for DNR status.  Recommend referral to [facility].  This was discussed with the patient as well as over the telephone with his daughter.  They are agreeable.  [name] consult has been placed.  Recommendations: 1.  Comfort care, hospice referral for home with hospice versus residential hospice. 2.  Discontinue nonessential medications, focus on comfort   LOS: 2 days   Drew Bussing, NP   09/04/2020, 10:06 AM Drew [name] was interviewed and examined.  He appears stable.  He denies pain and bleeding.  The hemoglobin is higher after the red cell transfusion.  Drew [name] agrees to hospice care.  He would like to return home.  I discussed the case with his daughter by telephone.  I explained the predicted prognosis of a few weeks to a month.  I will follow him with the [facility] hospice team.  I was present for greater than 50% of today's visit.  I performed medical decision making.

## 2020-09-05 ENCOUNTER — Telehealth: Payer: Self-pay | Admitting: *Deleted

## 2020-09-05 DIAGNOSIS — K922 Gastrointestinal hemorrhage, unspecified: Secondary | ICD-10-CM

## 2020-09-05 DIAGNOSIS — C787 Secondary malignant neoplasm of liver and intrahepatic bile duct: Secondary | ICD-10-CM

## 2020-09-05 DIAGNOSIS — K311 Adult hypertrophic pyloric stenosis: Secondary | ICD-10-CM

## 2020-09-05 DIAGNOSIS — D649 Anemia, unspecified: Secondary | ICD-10-CM | POA: Diagnosis not present

## 2020-09-05 DIAGNOSIS — E43 Unspecified severe protein-calorie malnutrition: Secondary | ICD-10-CM

## 2020-09-05 MED ORDER — METOPROLOL TARTRATE 25 MG PO TABS
12.5000 mg | ORAL_TABLET | Freq: Two times a day (BID) | ORAL | 0 refills | Status: AC
Start: 2020-09-05 — End: 2020-10-05

## 2020-09-05 MED ORDER — ACETAMINOPHEN 500 MG PO TABS: 500.0000 mg | ORAL_TABLET | Freq: Every evening | ORAL | 0 refills | Status: DC

## 2020-09-05 MED ORDER — HEPARIN SOD (PORK) LOCK FLUSH 100 UNIT/ML IV SOLN
500.0000 [IU] | INTRAVENOUS | Status: DC | PRN
  Filled 2020-09-05: qty 5

## 2020-09-05 NOTE — TOC Transition Note (Signed)
Transition of Care [facility]) - CM/SW Discharge Note   Patient Details  Name: Drew [name] MRN: [id number] Date of Birth: 1946-03-09  Transition of Care [facility]) CM/SW Contact:  Drew Catalan, RN Phone Number: 09/05/2020, 11:03 AM   Clinical Narrative:    Pt to dc home with hospice today. Yellow DNR on chart for transport. PTAR contacted to transport pt home. Daughter Drew [name] in the room, and will meet pt at home.    Final next level of care: Home w Hospice Care Barriers to Discharge: Continued Medical Work up  Discharge Placement                Patient to be transferred to facility by: [place] Name of family member notified: Daughter Drew [name] Patient and family notified of of transfer: 09/05/20  Discharge Plan and Services   Discharge Planning Services: CM Consult                      HH Arranged: Disease Management [place] Agency: Hospice and [place] Date [place]: 09/04/20 Time [place]: 47 Representative spoke with at [place]: [place] ([place]) Interventions     Readmission Risk Interventions Readmission Risk Prevention Plan 09/04/2020  Transportation Screening Complete  Medication Review [employer]) Complete  PCP or Specialist appointment within 3-5 days of discharge Complete  HRI or Home Care Consult Complete  SW Recovery Care/Counseling Consult Complete  [place] Not Applicable  Some recent data might be hidden

## 2020-09-05 NOTE — Telephone Encounter (Signed)
Called daughter and confirmed he got home today with Hospice care. They will see him today at 3:30 pm for his intake visit. Informed her that he still has an appointment on 2/25 for lab/flush/OV with Dr. Benay Spice and asking if they still wish to come or want to change to a video visit. She still wants to bring him in. Very interested in his Hgb level. Also made her aware that if it gets too difficult at home, Dr. Benay Spice will assist in getting him in to [facility]. She appreciated the call.

## 2020-09-05 NOTE — Progress Notes (Addendum)
IP PROGRESS NOTE  Subjective:  Reports he vomited last night and this morning, describes as "dark". Compazine helps the nausea. Denies pain. Feels short of breath with hiccups.   Objective: Vital signs in last 24 hours: Blood pressure 122/79, pulse 97, temperature 98.4 F (36.9 C), temperature source Axillary, resp. rate 18, height _0  (1.702 m), weight 130 lb 4.8 oz (59.1 kg), SpO2 100 %.  Intake/Output from previous day: 02/22 0701 - 02/23 0700 In: 240 [P.O.:240] Out: 1500 [Urine:700; Emesis/NG output:800]  Physical Exam:  HEENT: Scleral icterus. No thrush; black coating over tongue.  Lungs: Decreased breath sounds at the right compared to the left chest, no respiratory distress Cardiac: Regular, tachycardic. Abdomen: Mildly distended, nontender, no mass Extremities: No leg edema Portacath/PICC-without erythema  Lab Results: Recent Labs    09/02/20 1127 09/02/20 1843 09/03/20 1250 09/03/20 1835  WBC 7.2  --   --   --   HGB 5.2*   < > 7.1* 8.5*  HCT 17.5*   < > 23.0* 26.5*  PLT 243  --   --   --    < > = values in this interval not displayed.    BMET Recent Labs    09/02/20 1127 09/03/20 0536  NA 136 137  K 3.9 4.3  CL 102 104  CO2 26 25  GLUCOSE 153* 144*  BUN 30* 33*  CREATININE 0.58* 0.62  CALCIUM 8.2* 8.2*    Lab Results  Component Value Date   CEA1 14.92 (H) 04/24/2020    Studies/Results: No results found.  Medications: I have reviewed the patient's current medications.  Assessment/Plan: 1. 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  CT abdomen/pelvis at [facility] 01/03/2020-masslike wall thickening of the gastric antrum, numerous liver metastases, small volume ascites, numerous peritoneal nodules, enlarged periaortic and porta hepatis lymph nodes  Upper endoscopy 01/06/2020-large ulcer in the gastric antrum with heaped up and firm edges, Duodenal bulb largely "obliterated"by the mass, biopsy confirmed adenocarcinoma,  HER-2 3+, intact mismatch repair protein expression, PD-L1: 4   CT chest 01/10/2020-acute bilateral pulmonary emboli, no lymphadenopathy or suspicious lung nodules  Baseline echo 01/17/20 at [facility], EF 60-65%  CT abdomen/pelvis 01/22/2020, gastric mass, liver metastases, peritoneal/omental nodules, ascites, right pleural effusion  Therapeutic and diagnostic paracentesis on 01/27/2020, 2.1 L removed;cytology shows malignant cells,malignant ascites  PD-L1combined positive score: 1 (from ascites/periteoneal fluid), reported 02/01/20   Cycle 1 FOLFOX with trastuzumab and pembrolizumab, 01/31/2020  Baseline CEA 13.6 (02/08/2020)  Cycle 2 FOLFOX/trastuzumab 02/15/20  Pembrolizumab 02/22/2020  Cycle 3 FOLFOX/trastuzumab 02/28/2020  Cycle 4 FOLFOX/trastuzumab 03/14/2020  Pembrolizumab 03/14/2020  CTs 03/27/2020-multifocal liver metastases again noted, appear mildly decreased in size in the interval. Persistent diffuse peritoneal disease with mild decrease in size of index peritoneal lesions. Similar volume of upper abdominal ascites. Small right pleural effusion is decreased.  Cycle 5 FOLFOX/trastuzumab 03/28/2020  Pembrolizumab 9/22/202  Cycle 6 FOLFOX/trastuzumab 04/10/2020  Pembrolizumab 04/24/2020  Cycle 7 FOLFOX/trastuzumab 04/24/2020  Cycle 8 FOLFOX/trastuzumab 05/08/2020  Pembrolizumab 05/15/2020  CTs 05/21/2020-mildly worsened omental caking of tumor. Mild perihepatic ascites. Marked irregular gastric wall thickening in the gastric antrum traversed by gastric stent.  Cycle95-FU/trastuzumab 05/22/2020  Upper endoscopy 06/04/2020-moderately severe esophagitis; large, fungating polypoid and ulcerated circumferential mass in the gastric antrum, no bleeding/recent bleeding; obvious stenosis of stent with significant tumor ingrowth; stent placed  Cycle 1 Taxol/ramucirumab 06/18/2020  Cycle 2 Taxol/ramucirumab 07/13/2020  MRI brain 07/25/2020-no metastatic disease. Advanced chronic  microvascular ischemic changes. Remote lacunar infarct right cerebellar hemisphere. 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, new bibasilar pulmonary nodules, new bilateral common femoral DVTs  2.Nausea/vomiting secondary to #1 with gastric outlet obstruction  Gastric stent placement 01/12/2020, n/v resolved  Admission with intractable nausea/vomiting 06/02/2020-upper endoscopy 06/04/2000-tumor ingrowth in gastric antrum stent, new stent placed, gastric antrum mass  3.Acute bilateral lower lobe pulmonary emboli on CT 01/10/2020-treated with apixaban,(givenLovenoxduring hospitalization), apixaban discontinued during hospital admission November 2021  4.Anemia-likely secondary to bleeding from the gastric mass, chemotherapy, and chronic disease  5.Status post Port-A-Cath placement 01/13/2020  6.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 Hospital admission 06/02/2020-intractable nausea/vomiting secondary to tumor ingrowth at the gastric pylorus stent  12. Abdominal pain and  flushing during Taxol infusion 06/18/2020-resolved after receiving Benadryl, Pepcid and Solu-Medrol. Taxol resumed and completed. Dexamethasone home premedication added.  13.  Status post evaluation by Dr. 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  14.  Admission 09/02/2020 with severe anemia and nausea/vomiting-likely bleeding from the gastric tumor while on anticoagulation therapy  15.  Hyperbilirubinemia-likely secondary to liver metastases and biliary obstruction by tumor  Mr. Leger appears unchanged. He has advanced metastatic gastric cancer.    Recommendations: 1.  Comfort care, plan for home with hospice 2.  Discontinue nonessential medications, focus on comfort 3.  Continue antacid   LOS: 3 days   Ned Card, NP   09/05/2020, 7:41 AM Mr. Rumery was interviewed and examined.  He is less alert today.  He continues to have nausea and intermittent vomiting.  I suspect the nausea is secondary to the gastric tumor with gastric outlet obstruction and biliary obstruction.  Mr. Havens is not a candidate for further invasive procedures.  I recommend hospice care.  He would like to go home with hospice care.  He is a candidate for [employer] if he cannot be managed at home.  I will follow him with the [facility] team.  We will offer an outpatient appointment at the Cancer center or telehealth visit.  I was present for greater than 50% of today's visit.  I performed medical decision making.

## 2020-09-05 NOTE — Discharge Summary (Signed)
Physician Discharge Summary  Drew [name] PJA:[id number] DOB: 1945/11/10 DOA: 09/02/2020  PCP: Gaynelle Arabian, MD  Admit date: 09/02/2020 Discharge date: 09/05/2020  Admitted From: Home  Disposition:  Home with hospice   Recommendations for Outpatient Follow-up and new medication changes:  1. Follow up with Dr.Sherill as scheduled.  2. Follow up with hospice team.   Home Health: na   Equipment/Devices: na    Discharge Condition: stable  CODE STATUS: dnr   Diet recommendation:  As tolerated.   Brief/Interim Summary: Drew [name] was admitted to the hospital with a working diagnosis of acute blood loss anemia due to upper GI bleed in the setting of metastatic gastric adenocarcinoma.  75 year old male past medical history for gastric adenocarcinoma, hypertension, and GERD.  He presented with fatigue and confusion.  He had easy fatigability, dyspnea on exertion and rapid decline in physical functional capacity, that prompted him to be transferred to the hospital.  On his initial physical examination blood pressure 119/70, heart rate 92, respiratory rate 18, oxygen saturation 100%.  He was pale, lungs clear to auscultation bilaterally, heart S1-S2, present, rhythmic, soft abdomen, no lower extremity edema.  Sodium 136, potassium 3.9, chloride 102, bicarb 26, glucose 153, BUN 30, creatinine 0.58, lipase 133, AST 214, ALT 43, total bilirubin 6.1, direct 4.3, indirect 1.8, white cell count 7.2, hemoglobin 5.2, hematocrit 17.5, platelets 243. SARS COVID-19 negative. Urine analysis specific gravity> 1.046.  CT of the abdomen and pelvis with gastric stent in the antrum in place, hepatic and nodal metastatic disease.  Omental nodularity in caking consistent with implants.  Small ascites.  Small right pleural effusion.  Several small pulmonary nodules. Chest radiograph, Port-A-Cath right internal jugular vein, no infiltrates.  EKG 97 bpm, normal axis, normal intervals, sinus rhythm, no ST segment or  T wave changes.  Patient received 3 units packed red blood cells, along with IV fluids, antiacids and as needed antiemetics.  Patient was seen by oncology and gastroenterology, recommendations for continued care under hospice.   1.  Acute blood loss anemia due to upper GI bleed in the setting of gastric adenocarcinoma. Iron deficiency. Patient received 3 units of packed red blood cells, his discharge hemoglobin is 8.5, hematocrit 26.5. Continue antiacid therapy with pantoprazole twice daily.  On oral iron supplementation.   2.  Metastatic gastric adenocarcinoma, with gastric outlet obstruction status post stent.  Intra-abdominal and pulmonary metastasis.  Patient with progressive disease despite chemotherapy.  His liver enzymes and bilirubins have been elevated.  Because of gastric distention unable to perform palliative ERCP. Patient will continue care under hospice services.  3.  History of pulmonary embolism.  Because of high risk of bleeding anticoagulation has been held.  4.  History of CVA/ Parkinson's disease.  Patient will be transferred to hospice, continue sinemet as tolerated.  5.  Hypertension/ dyslipidemia.  His blood pressure has remained controlled, continue low-dose metoprolol.  Risk vs benefit, will discontinue statin therapy at this point.   6.  Severe protein calorie malnutrition.  Poor prognosis, nutritional supplements as tolerated.  Discharge Diagnoses:  Principal Problem:   Symptomatic anemia Active Problems:   Acute blood loss anemia   Acute upper GI bleed   Gastric adenocarcinoma (HCC)   Gastric outlet obstruction   Metastasis (HCC)   Severe protein-calorie malnutrition ([place])   DNR (do not resuscitate)    Discharge Instructions   Allergies as of 09/05/2020      Reactions   Atorvastatin Other (See Comments)   Lactose Intolerance (gi)  Pravastatin Other (See Comments)      Medication List    STOP taking these medications   apixaban 2.5 MG  Tabs tablet Commonly known as: ELIQUIS   atorvastatin 10 MG tablet Commonly known as: LIPITOR   dexamethasone 2 MG tablet Commonly known as: DECADRON   diphenoxylate-atropine 2.5-0.025 MG tablet Commonly known as: LOMOTIL   Fish Oil 1000 MG Caps   lidocaine-prilocaine cream Commonly known as: EMLA   metoprolol succinate 25 MG 24 hr tablet Commonly known as: TOPROL-XL   potassium chloride SA 20 MEQ tablet Commonly known as: KLOR-CON     TAKE these medications   acetaminophen 500 MG tablet Commonly known as: TYLENOL Take 1 tablet (500 mg total) by mouth at bedtime. What changed: how much to take   baclofen 10 MG tablet Commonly known as: LIORESAL Take 1 tablet (10 mg total) by mouth 3 (three) times daily as needed for muscle spasms.   carbidopa-levodopa 25-100 MG tablet Commonly known as: SINEMET IR Take 1 tablet by mouth 3 (three) times daily.   FeroSul 325 (65 FE) MG tablet Generic drug: ferrous sulfate Take 1 tablet (325 mg total) by mouth daily.   metoprolol tartrate 25 MG tablet Commonly known as: LOPRESSOR Take 0.5 tablets (12.5 mg total) by mouth 2 (two) times daily.   pantoprazole 40 MG tablet Commonly known as: Protonix Take 1 tablet (40 mg total) by mouth 2 (two) times daily.   polyethylene glycol 17 g packet Commonly known as: MIRALAX / GLYCOLAX Take 17 g by mouth daily as needed. What changed: reasons to take this   prochlorperazine 10 MG tablet Commonly known as: COMPAZINE Take 1 tablet (10 mg total) by mouth every 6 (six) hours as needed for nausea or vomiting.   tamsulosin 0.4 MG Caps capsule Commonly known as: FLOMAX TAKE 1 CAPSULE(0.4 MG) BY MOUTH DAILY What changed:   how much to take  how to take this  when to take this  additional instructions       Allergies  Allergen Reactions  . Atorvastatin Other (See Comments)  . Lactose Intolerance (Gi)   . Pravastatin Other (See Comments)    Consultations:  Oncology   GI    Palliative Care    Procedures/Studies: DG Chest 1 View  Result Date: 09/02/2020 CLINICAL DATA:  Vomiting. Family reports disorientation. History of stage IV gastric cancer. EXAM: CHEST  1 VIEW COMPARISON:  02/13/2020 FINDINGS: Right chest wall port a catheter is noted with tip at the cavoatrial junction. The heart size appears normal. No pleural effusion or edema identified. No airspace consolidation. IMPRESSION: No acute cardiopulmonary abnormalities. Electronically Signed   By: Kerby Moors M.D.   On: 09/02/2020 12:30   CT ABDOMEN PELVIS W CONTRAST  Result Date: 09/02/2020 CLINICAL DATA:  89 male with jaundice. EXAM: CT ABDOMEN AND PELVIS WITH CONTRAST TECHNIQUE: Multidetector CT imaging of the abdomen and pelvis was performed using the standard protocol following bolus administration of intravenous contrast. CONTRAST:  170mL OMNIPAQUE IOHEXOL 300 MG/ML  SOLN COMPARISON:  Abdominal CT dated 08/23/2020 FINDINGS: Lower chest: Small right pleural effusion with associated minimal right lung base compressive atelectasis similar to prior CT. Several small pulmonary nodules as seen on the prior CT and concerning for metastatic disease. Partially visualized central venous line with tip in the region of the cavoatrial junction. No intra-abdominal free air.  Small ascites. Hepatobiliary: Multiple hepatic hypodense lesions consistent with metastatic disease as seen on the prior CT. There is intrahepatic biliary ductal dilatation  or periportal edema. No calcified gallstone. Pancreas: The pancreas is unremarkable as visualized. Spleen: Normal in size without focal abnormality. Adrenals/Urinary Tract: The adrenal glands unremarkable. There is no hydronephrosis on either side. There is symmetric enhancement and excretion of contrast by both kidneys. Multiple small bilateral renal cysts. The visualized ureters and urinary bladder appear unremarkable. Stomach/Bowel: Masslike thickening of the distal  stomach as seen previously in keeping with known malignancy. A wall stent in the gastric antrum appears in similar position. Interval improvement and decrease in the distension of the stomach compared to the prior CT. Evaluation of stent patency or tumor infiltration is very limited on this CT. There is moderate stool throughout the colon. No evidence of bowel obstruction. The appendix is normal. Vascular/Lymphatic: Mild aortoiliac atherosclerotic disease. The IVC is unremarkable. No portal venous gas. Interval decrease in the luminal distension of the common femoral vein seen on the prior CT with DVT. No portal venous gas. Similar appearance of adenopathy in the region of the gastroduodenal ligament. Reproductive: The prostate and seminal vesicles are grossly unremarkable. Other: There is omental nodularity and caking consistent with implants. Musculoskeletal: Mild subcutaneous edema. No acute osseous pathology. IMPRESSION: 1. Interval improvement and decrease in the distension of the stomach compared to the prior CT. A wall stent in the gastric antrum appears in similar position. 2. Hepatic and nodal metastatic disease as seen on the prior CT. 3. Omental nodularity and caking consistent with implants. 4. Small ascites. 5. Small right pleural effusion with associated minimal right lung base compressive atelectasis similar to prior CT. 6. Several small pulmonary nodules as seen on the prior CT and concerning for metastatic disease. 7. Aortic Atherosclerosis (ICD10-I70.0). Electronically Signed   By: Anner Crete M.D.   On: 09/02/2020 16:49   CT Abdomen Pelvis W Contrast  Result Date: 08/24/2020 CLINICAL DATA:  Follow-up metastatic gastric adenocarcinoma. Assess treatment response. EXAM: CT ABDOMEN AND PELVIS WITH CONTRAST TECHNIQUE: Multidetector CT imaging of the abdomen and pelvis was performed using the standard protocol following bolus administration of intravenous contrast. CONTRAST:  37mL OMNIPAQUE  IOHEXOL 300 MG/ML  SOLN COMPARISON:  05/21/2020 FINDINGS: Lower Chest: Tiny right pleural effusion noted. Multiple small ill-defined pulmonary nodules are seen in both lung bases, which are new and suspicious for pulmonary metastases. Hepatobiliary: Multiple small hypoechoic liver masses are new or increased in size since previous study, consistent with progression of liver metastases. The vast majority measure 1-2 cm in size. A few small hepatic cysts are again seen which are unchanged. Gallbladder is unremarkable. No evidence of biliary ductal dilatation. Pancreas:  No mass or inflammatory changes. Spleen: Within normal limits in size and appearance. Adrenals/Urinary Tract: No masses identified. Stable small bilateral renal cysts. No evidence of ureteral calculi or hydronephrosis. Stomach/Bowel: Masslike wall thickening is again seen involving the gastric antrum with a wall stent remaining in place which extends into the proximal duodenum. The degree of gastric wall thickening shows no significant change, however increased gastric distension is noted with solid food stuff, and this is suspicious for gastric outlet obstruction. Small and large bowel are unremarkable. Vascular/Lymphatic: Mild lymphadenopathy in the porta hepatis and gastrohepatic ligament is again seen. Largest lymph node in the gastroduodenal ligament measures 1.9 cm on image 27/2, mildly increased from 1.5 cm previously. 1.1 cm lymph node in the right cardiophrenic angle also shows slight increase in size. Aortic atherosclerotic calcification noted. New DVT is seen within the common femoral veins bilaterally. No DVT is seen within the iliac veins  or IVC. Reproductive:  No mass or other significant abnormality. Other: Mild ascites is new since previous study. Multiple soft tissue masses and nodules within the omentum in the right abdomen show no significant change, consistent with omental carcinomatosis. Musculoskeletal:  No suspicious bone lesions  identified. IMPRESSION: Masslike wall thickening involving the gastric antrum and proximal duodenum shows no significant change and endoluminal stent remains in place, however increased gastric distension with solid food stuff is suspicious for gastric outlet obstruction. Increased hepatic metastatic disease. Increased mild abdominal lymphadenopathy, consistent with metastatic disease. 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 New small bibasilar pulmonary nodules, highly suspicious for pulmonary metastases. New bilateral common femoral vein DVT. These results will be called to the ordering clinician or representative by the Radiologist Assistant, and communication documented in the PACS or [employer]. Aortic Atherosclerosis (ICD10-I70.0). Electronically Signed   By: Marlaine Hind M.D.   On: 08/24/2020 10:40       Subjective: Patient somnolent but easy to arouse, not in pain or dyspnea, asking if he can go home today.   Discharge Exam: Vitals:   09/05/20 0442 09/05/20 0500  BP: 122/79   Pulse: (!) 114 97  Resp: 18   Temp: 98.4 F (36.9 C)   SpO2: 100%    Vitals:   09/04/20 1353 09/04/20 1957 09/05/20 0442 09/05/20 0500  BP: 117/74 113/77 122/79   Pulse: 89 (!) 105 (!) 114 97  Resp: 16 20 18    Temp: 97.7 F (36.5 C) 98.8 F (37.1 C) 98.4 F (36.9 C)   TempSrc: Oral Oral Axillary   SpO2: 100% 100% 100%   Weight:      Height:        General: deconditioned and ill looking appearing  Neurology: Awake and alert, non focal  E ENT: positive pallor, mild icterus, oral mucosa moist Cardiovascular: No JVD. S1-S2 present, rhythmic, no gallops, rubs, or murmurs. Trace lower extremity edema. Pulmonary: positive breath sounds bilaterally, Gastrointestinal. Abdomen distended but not tender Skin. No rashes Musculoskeletal: no joint deformities   The results of significant diagnostics from this hospitalization (including imaging, microbiology, ancillary and  laboratory) are listed below for reference.     Microbiology: Recent Results (from the past 240 hour(s))  Resp Panel by RT-PCR (Flu A&B, Covid) Nasopharyngeal Swab     Status: None   Collection Time: 09/02/20 12:17 PM   Specimen: Nasopharyngeal Swab; Nasopharyngeal(NP) swabs in vial transport medium  Result Value Ref Range Status   SARS Coronavirus 2 by RT PCR NEGATIVE NEGATIVE Final    Comment: (NOTE) SARS-CoV-2 target nucleic acids are NOT DETECTED.  The SARS-CoV-2 RNA is generally detectable in upper respiratory specimens during the acute phase of infection. The lowest concentration of SARS-CoV-2 viral copies this assay can detect is 138 copies/mL. A negative result does not preclude SARS-Cov-2 infection and should not be used as the sole basis for treatment or other patient management decisions. A negative result may occur with  improper specimen collection/handling, submission of specimen other than nasopharyngeal swab, presence of viral mutation(s) within the areas targeted by this assay, and inadequate number of viral copies(<138 copies/mL). A negative result must be combined with clinical observations, patient history, and epidemiological information. The expected result is Negative.  Fact Sheet for Patients:  EntrepreneurPulse.com.au  Fact Sheet for Healthcare Providers:  IncredibleEmployment.be  This test is no t yet approved or cleared by the [place] FDA and  has been authorized for detection and/or diagnosis of SARS-CoV-2 by  FDA under an Emergency Use Authorization (EUA). This EUA will remain  in effect (meaning this test can be used) for the duration of the COVID-19 declaration under Section 564(b)(1) of the Act, 21 U.S.C.section 360bbb-3(b)(1), unless the authorization is terminated  or revoked sooner.       Influenza A by PCR NEGATIVE NEGATIVE Final   Influenza B by PCR NEGATIVE NEGATIVE Final    Comment: (NOTE) The  Xpert Xpress SARS-CoV-2/FLU/RSV plus assay is intended as an aid in the diagnosis of influenza from Nasopharyngeal swab specimens and should not be used as a sole basis for treatment. Nasal washings and aspirates are unacceptable for Xpert Xpress SARS-CoV-2/FLU/RSV testing.  Fact Sheet for Patients: EntrepreneurPulse.com.au  Fact Sheet for Healthcare Providers: IncredibleEmployment.be  This test is not yet approved or cleared by the [place] FDA and has been authorized for detection and/or diagnosis of SARS-CoV-2 by FDA under an Emergency Use Authorization (EUA). This EUA will remain in effect (meaning this test can be used) for the duration of the COVID-19 declaration under Section 564(b)(1) of the Act, 21 U.S.C. section 360bbb-3(b)(1), unless the authorization is terminated or revoked.  Performed at [facility], [place] [address], [place], [place] [address]      Labs: BNP (last 3 results) No results for input(s): BNP in the last 8760 hours. Basic Metabolic Panel: Recent Labs  Lab 09/02/20 1127 09/03/20 0536  NA 136 137  K 3.9 4.3  CL 102 104  CO2 26 25  GLUCOSE 153* 144*  BUN 30* 33*  CREATININE 0.58* 0.62  CALCIUM 8.2* 8.2*   Liver Function Tests: Recent Labs  Lab 09/02/20 1203 09/03/20 0536  AST 214* 195*  ALT 43 74*  ALKPHOS 604* 586*  BILITOT 6.1* 6.6*  PROT 5.3* 4.8*  ALBUMIN 2.4* 2.1*   Recent Labs  Lab 09/02/20 1203  LIPASE 153*   Recent Labs  Lab 09/02/20 1650  AMMONIA 24   CBC: Recent Labs  Lab 09/02/20 1127 09/02/20 1843 09/03/20 0145 09/03/20 0536 09/03/20 1250 09/03/20 1835  WBC 7.2  --   --   --   --   --   HGB 5.2* 6.2* 6.3* 5.9* 7.1* 8.5*  HCT 17.5* 20.6* 20.8* 19.7* 23.0* 26.5*  MCV 89.7  --   --   --   --   --   PLT 243  --   --   --   --   --    Cardiac Enzymes: Recent Labs  Lab 09/03/20 1250  CKTOTAL 15*   BNP: Invalid input(s): POCBNP CBG: No results  for input(s): GLUCAP in the last 168 hours. D-Dimer No results for input(s): DDIMER in the last 72 hours. Hgb A1c No results for input(s): HGBA1C in the last 72 hours. Lipid Profile No results for input(s): CHOL, HDL, LDLCALC, TRIG, CHOLHDL, LDLDIRECT in the last 72 hours. Thyroid function studies No results for input(s): TSH, T4TOTAL, T3FREE, THYROIDAB in the last 72 hours.  Invalid input(s): FREET3 Anemia work up No results for input(s): VITAMINB12, FOLATE, FERRITIN, TIBC, IRON, RETICCTPCT in the last 72 hours. Urinalysis    Component Value Date/Time   COLORURINE AMBER (A) 09/02/2020 1938   APPEARANCEUR CLEAR 09/02/2020 1938   LABSPEC >1.046 (H) 09/02/2020 1938   PHURINE 6.0 09/02/2020 1938   GLUCOSEU NEGATIVE 09/02/2020 1938   HGBUR NEGATIVE 09/02/2020 1938   BILIRUBINUR SMALL (A) 09/02/2020 1938   KETONESUR NEGATIVE 09/02/2020 1938   PROTEINUR NEGATIVE 09/02/2020 1938   UROBILINOGEN 0.2 09/25/2011 2206  NITRITE NEGATIVE 09/02/2020 1938   LEUKOCYTESUR NEGATIVE 09/02/2020 1938   Sepsis Labs Invalid input(s): PROCALCITONIN,  WBC,  LACTICIDVEN Microbiology Recent Results (from the past 240 hour(s))  Resp Panel by RT-PCR (Flu A&B, Covid) Nasopharyngeal Swab     Status: None   Collection Time: 09/02/20 12:17 PM   Specimen: Nasopharyngeal Swab; Nasopharyngeal(NP) swabs in vial transport medium  Result Value Ref Range Status   SARS Coronavirus 2 by RT PCR NEGATIVE NEGATIVE Final    Comment: (NOTE) SARS-CoV-2 target nucleic acids are NOT DETECTED.  The SARS-CoV-2 RNA is generally detectable in upper respiratory specimens during the acute phase of infection. The lowest concentration of SARS-CoV-2 viral copies this assay can detect is 138 copies/mL. A negative result does not preclude SARS-Cov-2 infection and should not be used as the sole basis for treatment or other patient management decisions. A negative result may occur with  improper specimen collection/handling,  submission of specimen other than nasopharyngeal swab, presence of viral mutation(s) within the areas targeted by this assay, and inadequate number of viral copies(<138 copies/mL). A negative result must be combined with clinical observations, patient history, and epidemiological information. The expected result is Negative.  Fact Sheet for Patients:  EntrepreneurPulse.com.au  Fact Sheet for Healthcare Providers:  IncredibleEmployment.be  This test is no t yet approved or cleared by the [place] FDA and  has been authorized for detection and/or diagnosis of SARS-CoV-2 by FDA under an Emergency Use Authorization (EUA). This EUA will remain  in effect (meaning this test can be used) for the duration of the COVID-19 declaration under Section 564(b)(1) of the Act, 21 U.S.C.section 360bbb-3(b)(1), unless the authorization is terminated  or revoked sooner.       Influenza A by PCR NEGATIVE NEGATIVE Final   Influenza B by PCR NEGATIVE NEGATIVE Final    Comment: (NOTE) The Xpert Xpress SARS-CoV-2/FLU/RSV plus assay is intended as an aid in the diagnosis of influenza from Nasopharyngeal swab specimens and should not be used as a sole basis for treatment. Nasal washings and aspirates are unacceptable for Xpert Xpress SARS-CoV-2/FLU/RSV testing.  Fact Sheet for Patients: EntrepreneurPulse.com.au  Fact Sheet for Healthcare Providers: IncredibleEmployment.be  This test is not yet approved or cleared by the [place] FDA and has been authorized for detection and/or diagnosis of SARS-CoV-2 by FDA under an Emergency Use Authorization (EUA). This EUA will remain in effect (meaning this test can be used) for the duration of the COVID-19 declaration under Section 564(b)(1) of the Act, 21 U.S.C. section 360bbb-3(b)(1), unless the authorization is terminated or revoked.  Performed at [facility], [place] [address], [place], [place] [address]      Time coordinating discharge: 45 minutes  SIGNED:   Tawni Millers, MD  [facility] 09/05/2020, 9:32 AM

## 2020-09-07 ENCOUNTER — Inpatient Hospital Stay: Payer: Medicare PPO

## 2020-09-07 ENCOUNTER — Other Ambulatory Visit: Payer: Self-pay | Admitting: Student

## 2020-09-07 ENCOUNTER — Inpatient Hospital Stay: Payer: Medicare PPO | Admitting: Oncology

## 2020-09-10 ENCOUNTER — Telehealth: Payer: Self-pay | Admitting: *Deleted

## 2020-09-11 NOTE — Telephone Encounter (Signed)
Hospice called to report he was pronounced dead in home today at 1:50 pm.

## 2020-09-11 DEATH — deceased

## 2020-11-07 ENCOUNTER — Ambulatory Visit: Payer: Medicare PPO | Admitting: Neurology

## 2021-07-31 IMAGING — US US PARACENTESIS
1 series · 11 of 11 positions shown · non-contrast
Comparison: none

INDICATION: Vascular surgery consultation recommended due to increased risk of
rupture for AAA >5.5 cm. This recommendation follows ACR consensus
guidelines: White Paper of the ACR Incidental Findings Committee II
on Vascular Findings. [HOSPITAL] 0319; [DATE].

[Series 1: us paracentesis · 11 of 11 slices shown]
[im 1/11]
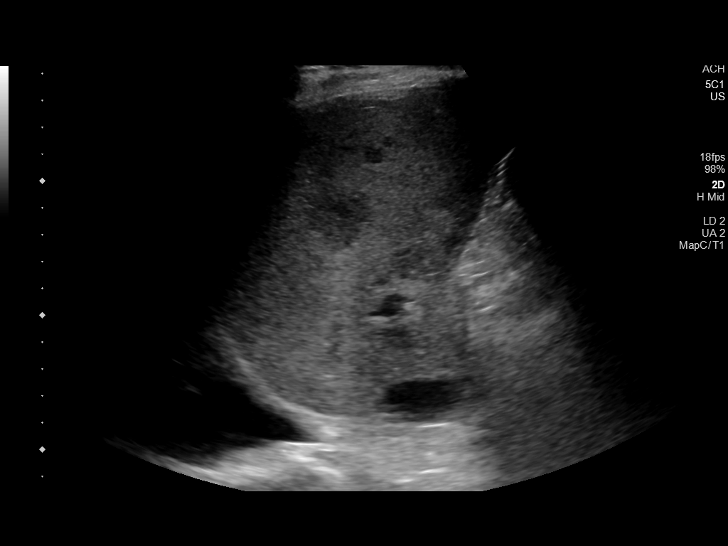
[im 2/11]
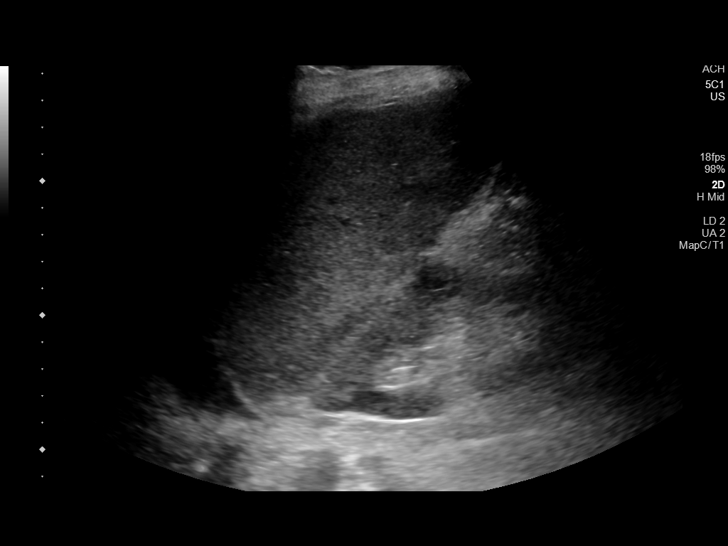
[im 3/11]
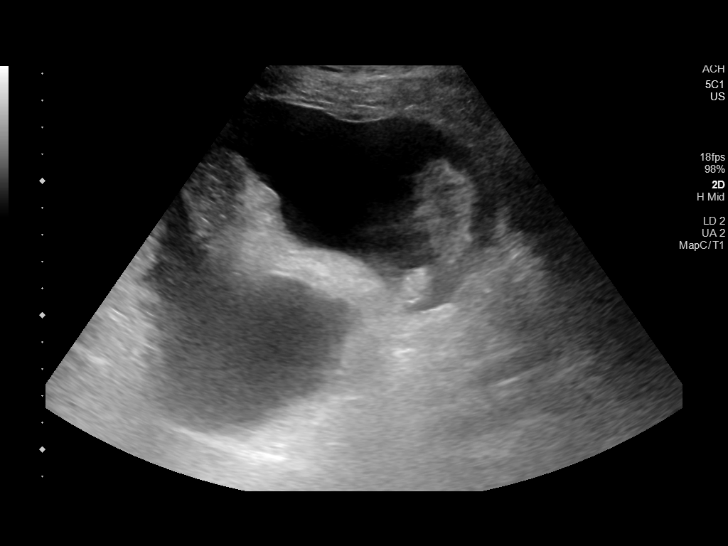
[im 4/11]
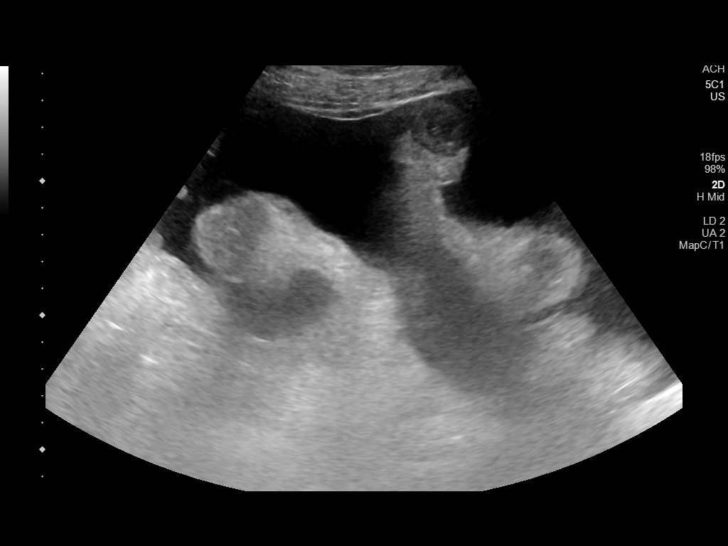
[im 5/11]
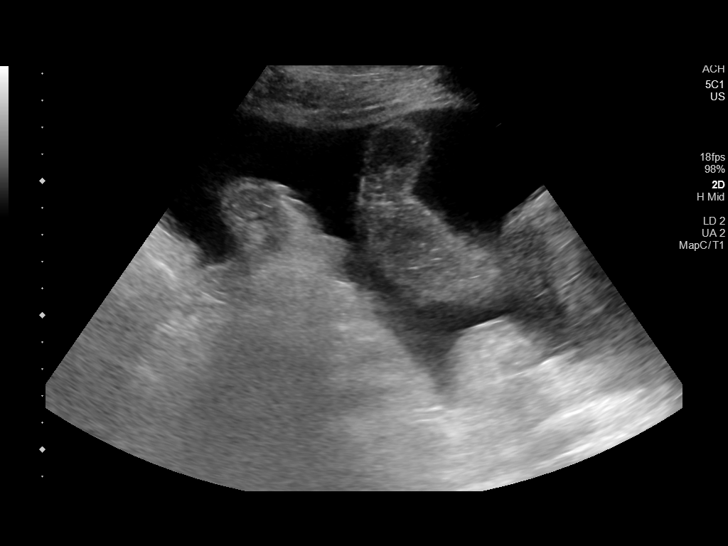
[im 6/11]
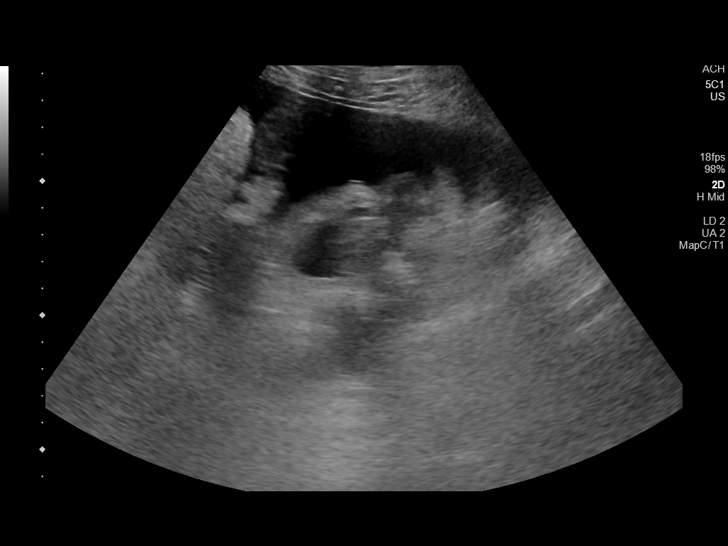
[im 7/11]
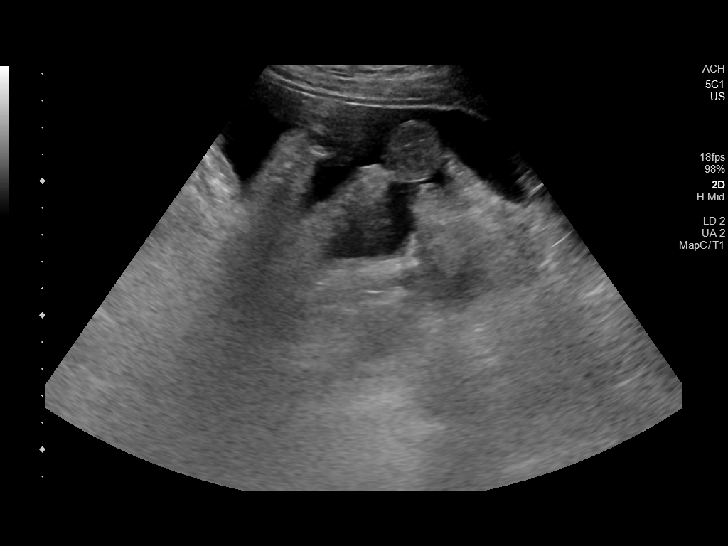
[im 8/11]
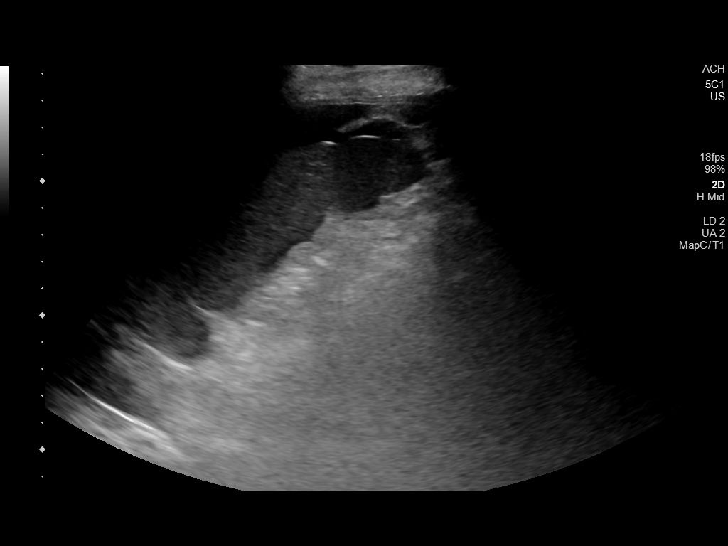
[im 9/11]
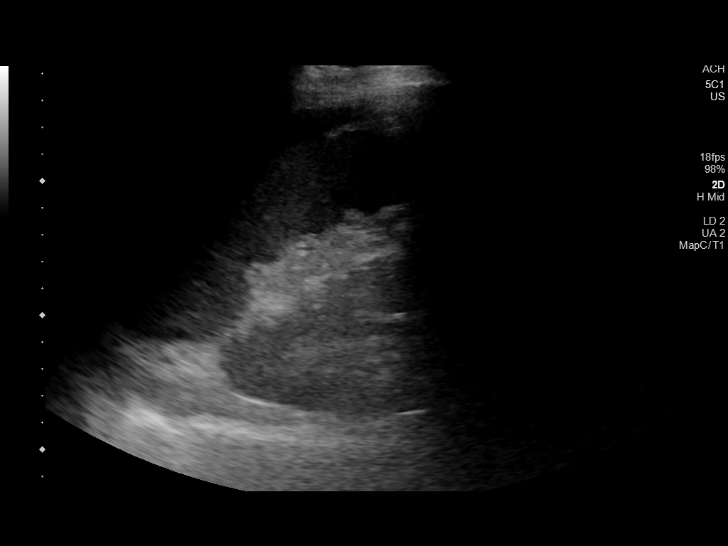
[im 10/11]
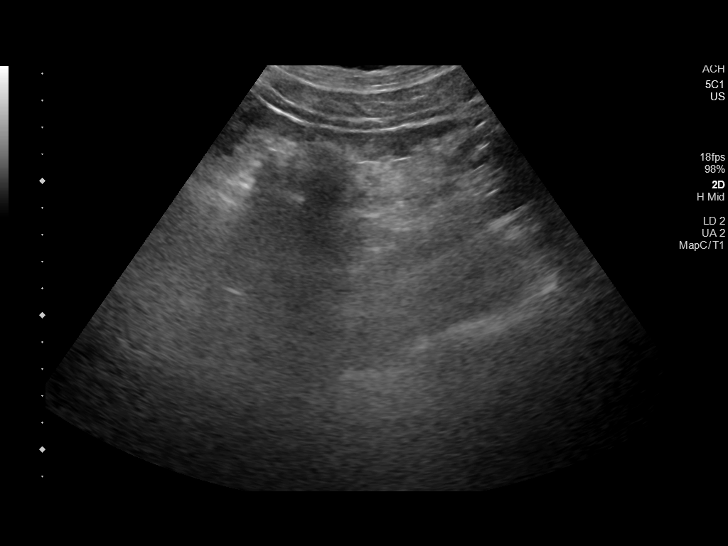
[im 11/11]
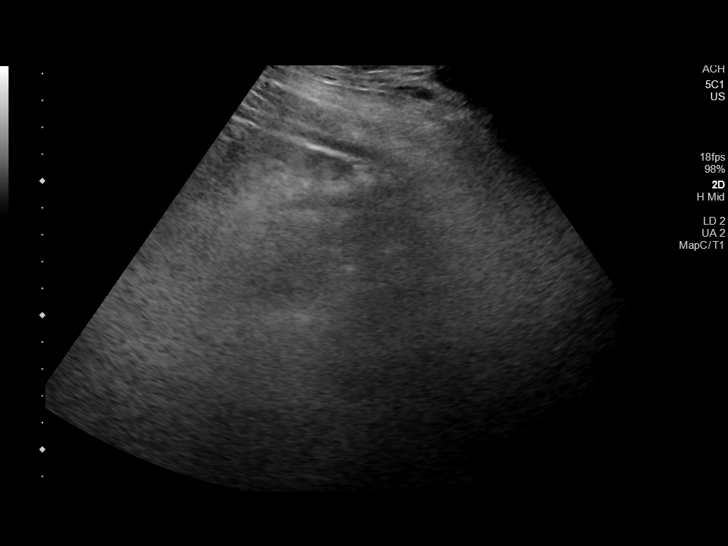

[11 of 11 positions shown; findings below may reference images not displayed]

EXAM:
ULTRASOUND GUIDED DIAGNOSTIC AND THERAPEUTIC PARACENTESIS

MEDICATIONS:
1% lidocaine to skin and subcutaneous tissue

COMPLICATIONS:
None immediate.

PROCEDURE:
Informed written consent was obtained from the patient after a
discussion of the risks, benefits and alternatives to treatment. A
timeout was performed prior to the initiation of the procedure.

Initial ultrasound scanning demonstrates a small to moderate amount
of ascites within the right lower abdominal quadrant. The right
lower abdomen was prepped and draped in the usual sterile fashion.
1% lidocaine was used for local anesthesia.

Following this, a 19 gauge, 7-cm, Yueh catheter was introduced. An
ultrasound image was saved for documentation purposes. The
paracentesis was performed. The catheter was removed and a dressing
was applied. The patient tolerated the procedure well without
immediate post procedural complication.
FINDINGS: A total of approximately 2.1 liters of yellow fluid was removed.
Samples were sent to the laboratory as requested by the clinical
team.
IMPRESSION: Successful ultrasound-guided diagnostic and therapeutic paracentesis
yielding 2.1 liters of peritoneal fluid.

## 2021-08-07 IMAGING — US US ABDOMEN LIMITED
1 series · 8 of 8 positions shown · non-contrast
Comparison: None.

CLINICAL DATA: 73-year-old male referred for paracentesis

EXAM:
LIMITED ABDOMEN ULTRASOUND FOR ASCITES
TECHNIQUE: Limited ultrasound survey for ascites was performed in all four
abdominal quadrants.

[Series 1: us abdomen limited · 8 of 8 slices shown]
[im 1/8]
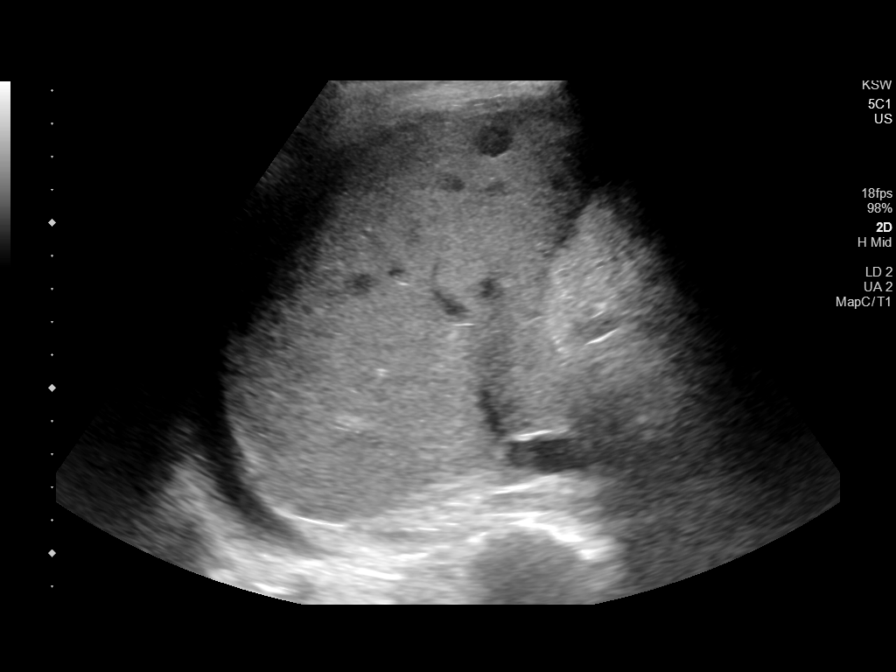
[im 2/8]
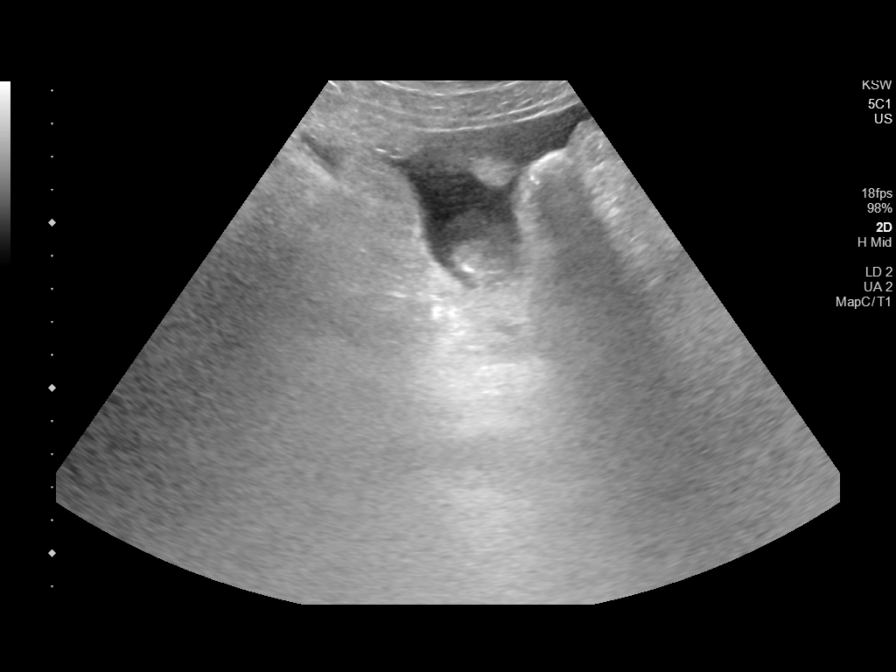
[im 3/8]
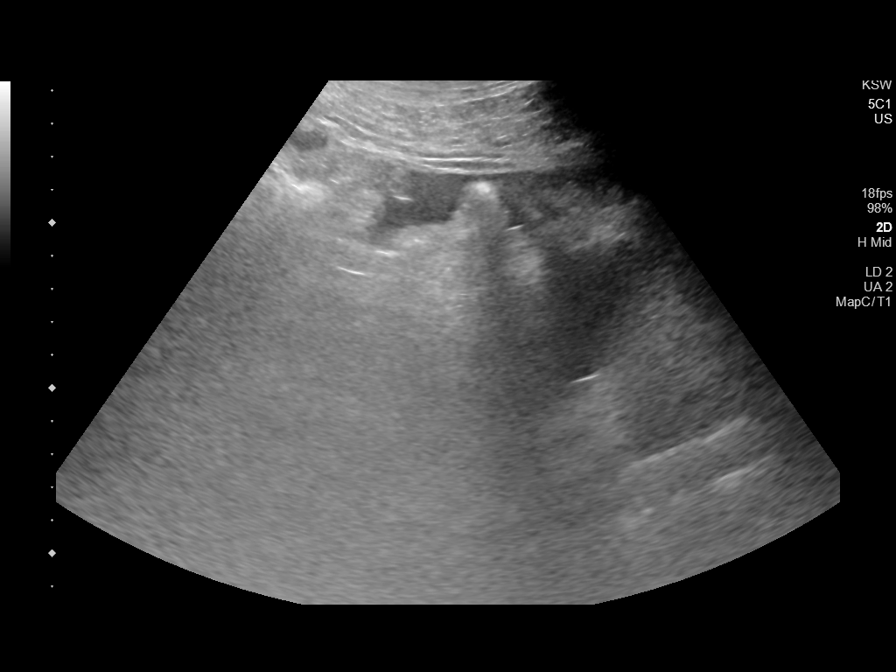
[im 4/8]
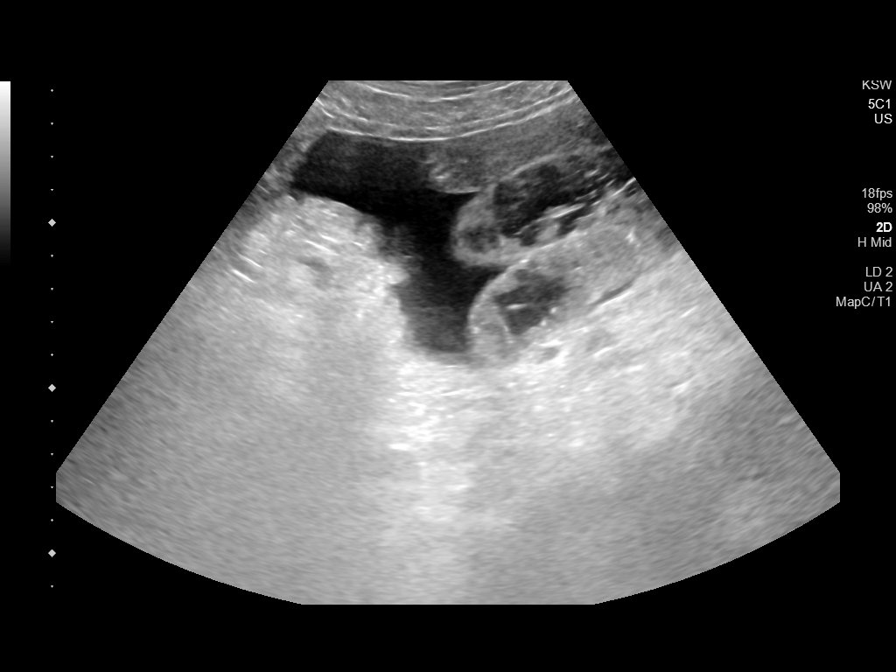
[im 5/8]
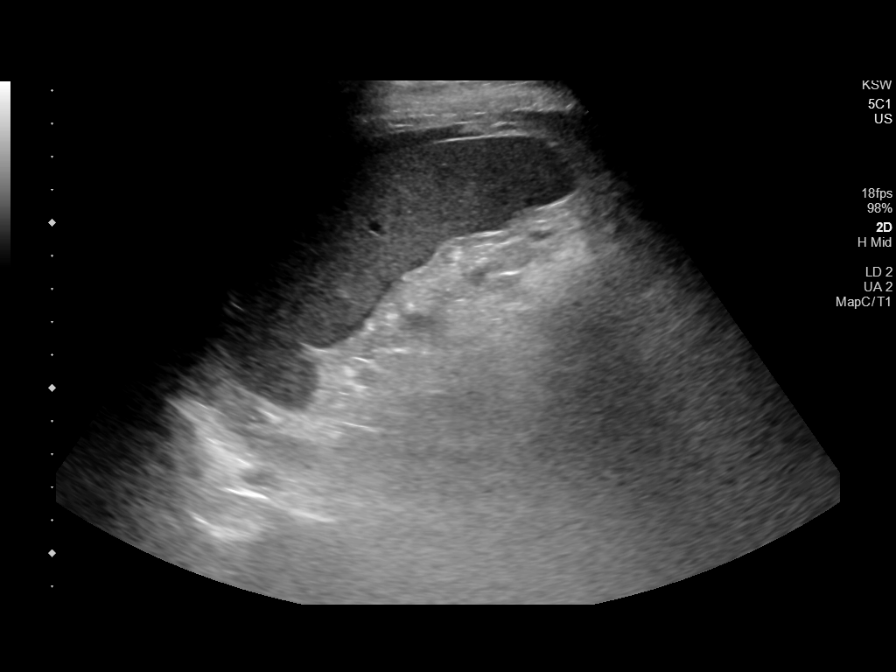
[im 6/8]
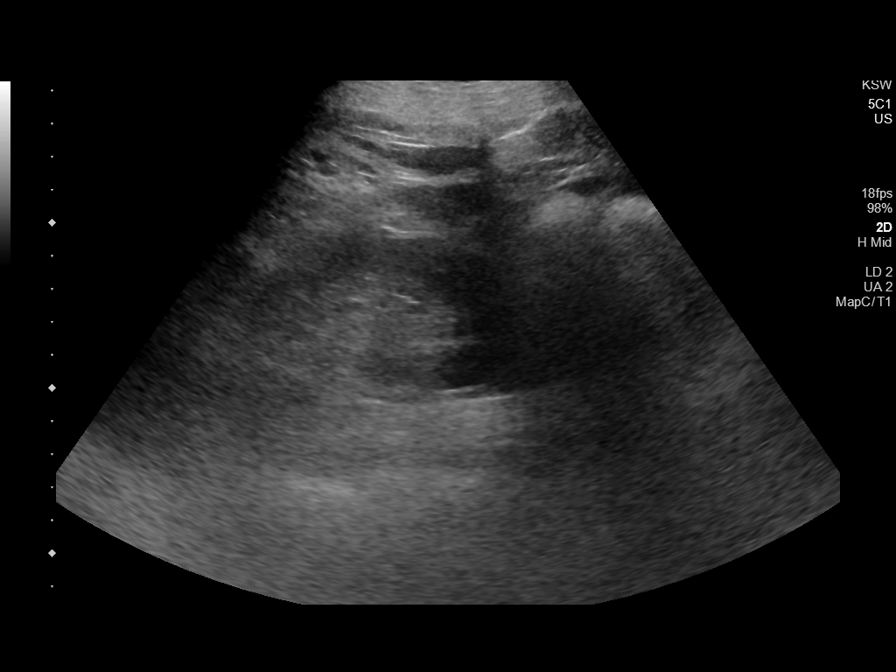
[im 7/8]
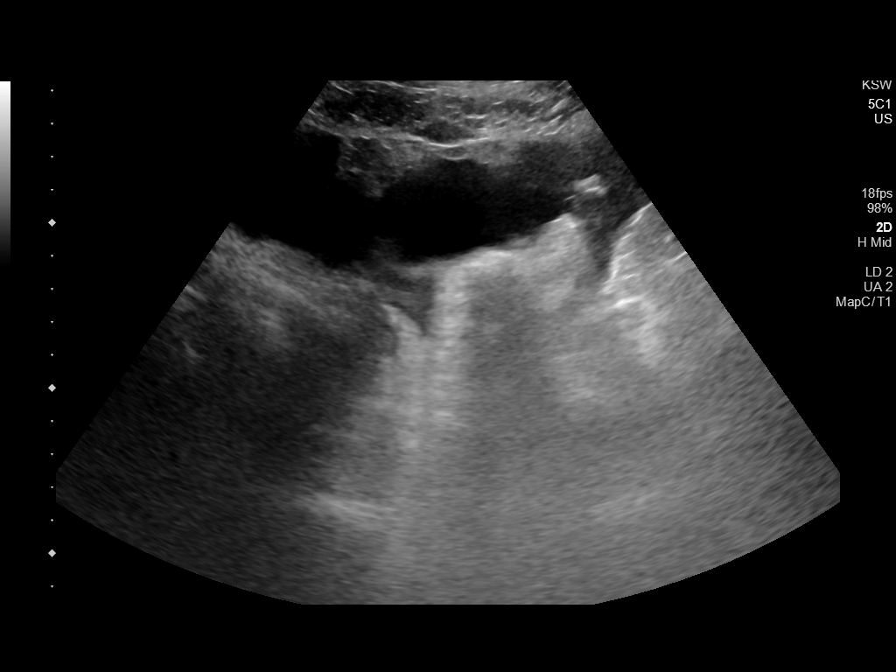
[im 8/8]
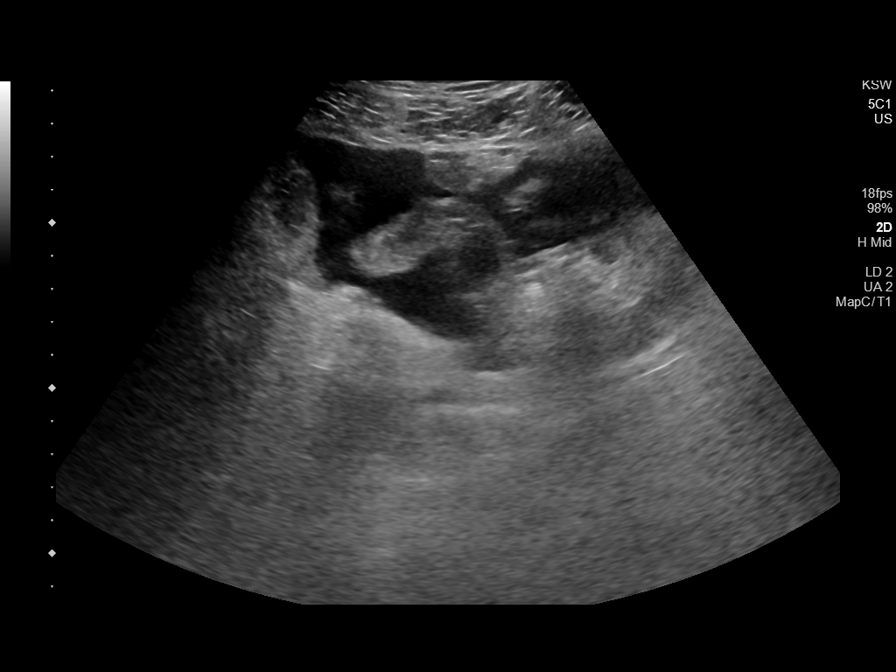

[8 of 8 positions shown; findings below may reference images not displayed]

FINDINGS: Ultrasound demonstrates scant ascites. No paracentesis performed at
this time
IMPRESSION: Scant ascites.

## 2021-12-05 IMAGING — CR DG ABDOMEN ACUTE W/ 1V CHEST
4 series · 4 of 4 positions shown · non-contrast
Comparison: 05/21/2020

CLINICAL DATA: Nausea, vomiting. Generalized weakness. History of
gastric cancer

EXAM:
DG ABDOMEN ACUTE WITH 1 VIEW CHEST

[w chest pa]
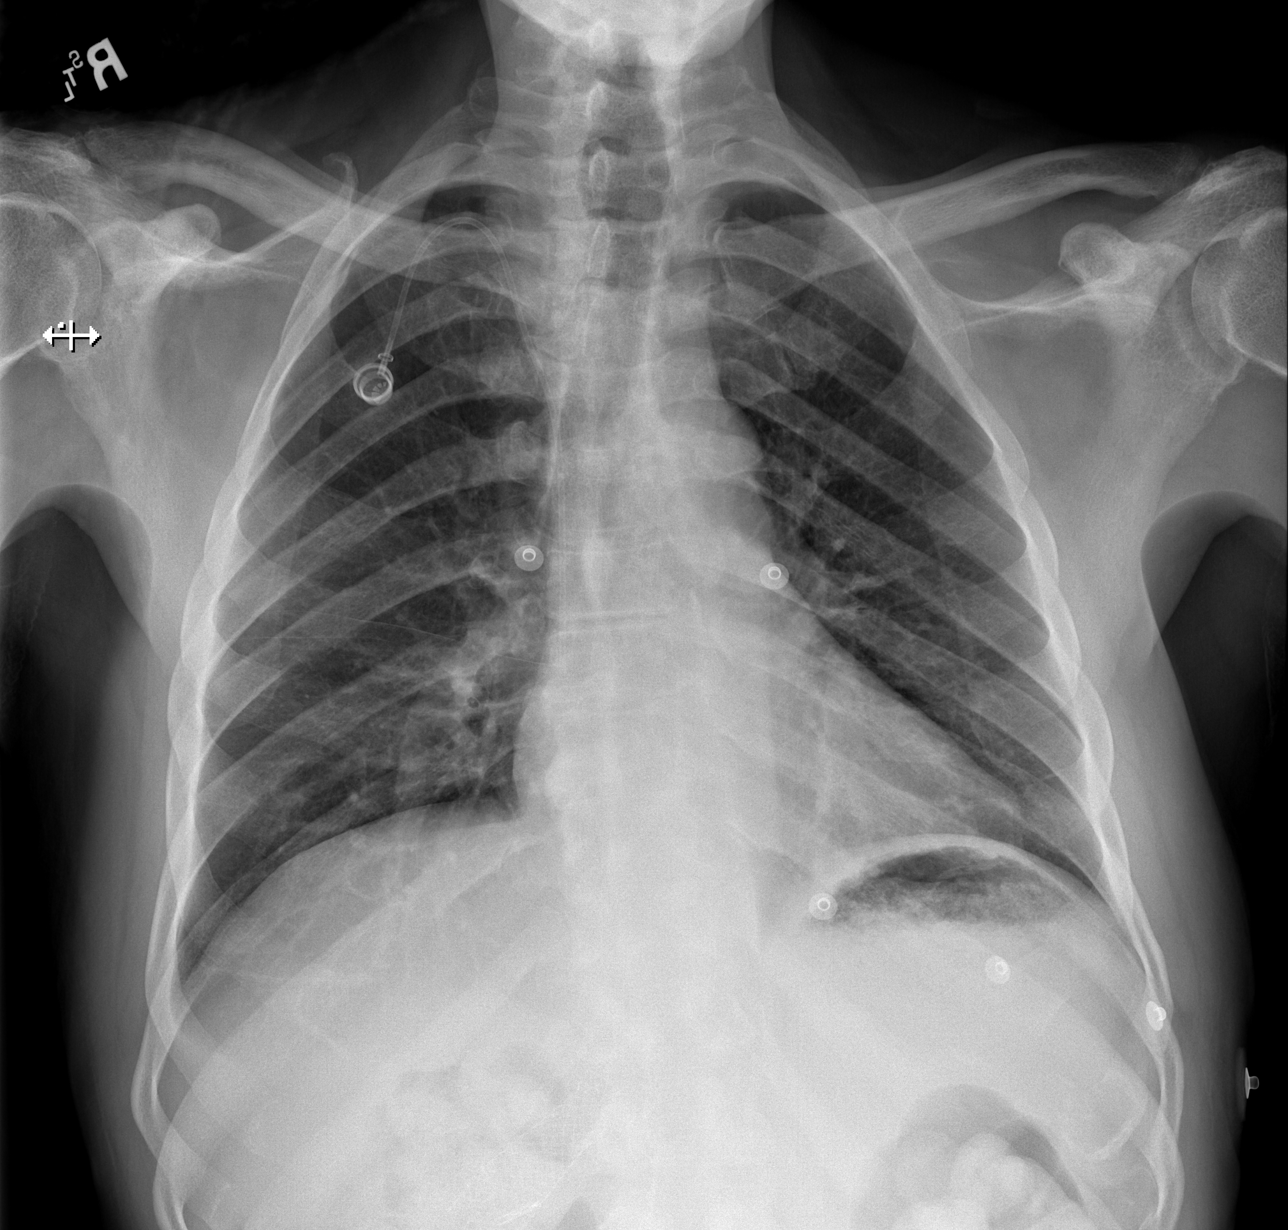

[w abdomen upright]
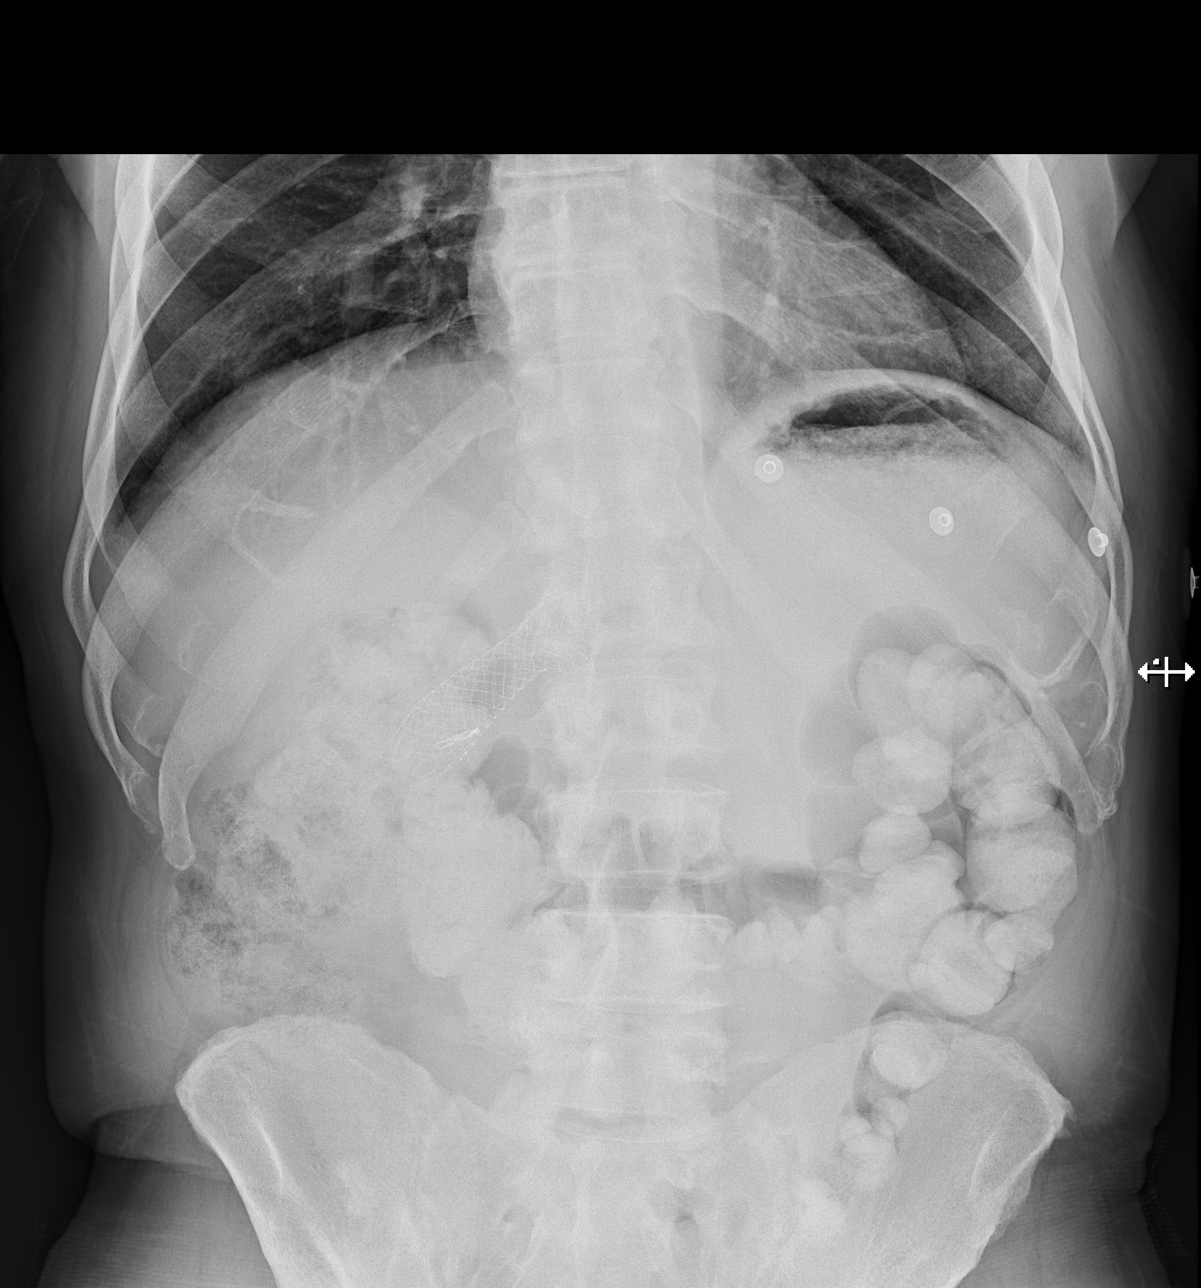

[t abdomen supine (1 of 2)]
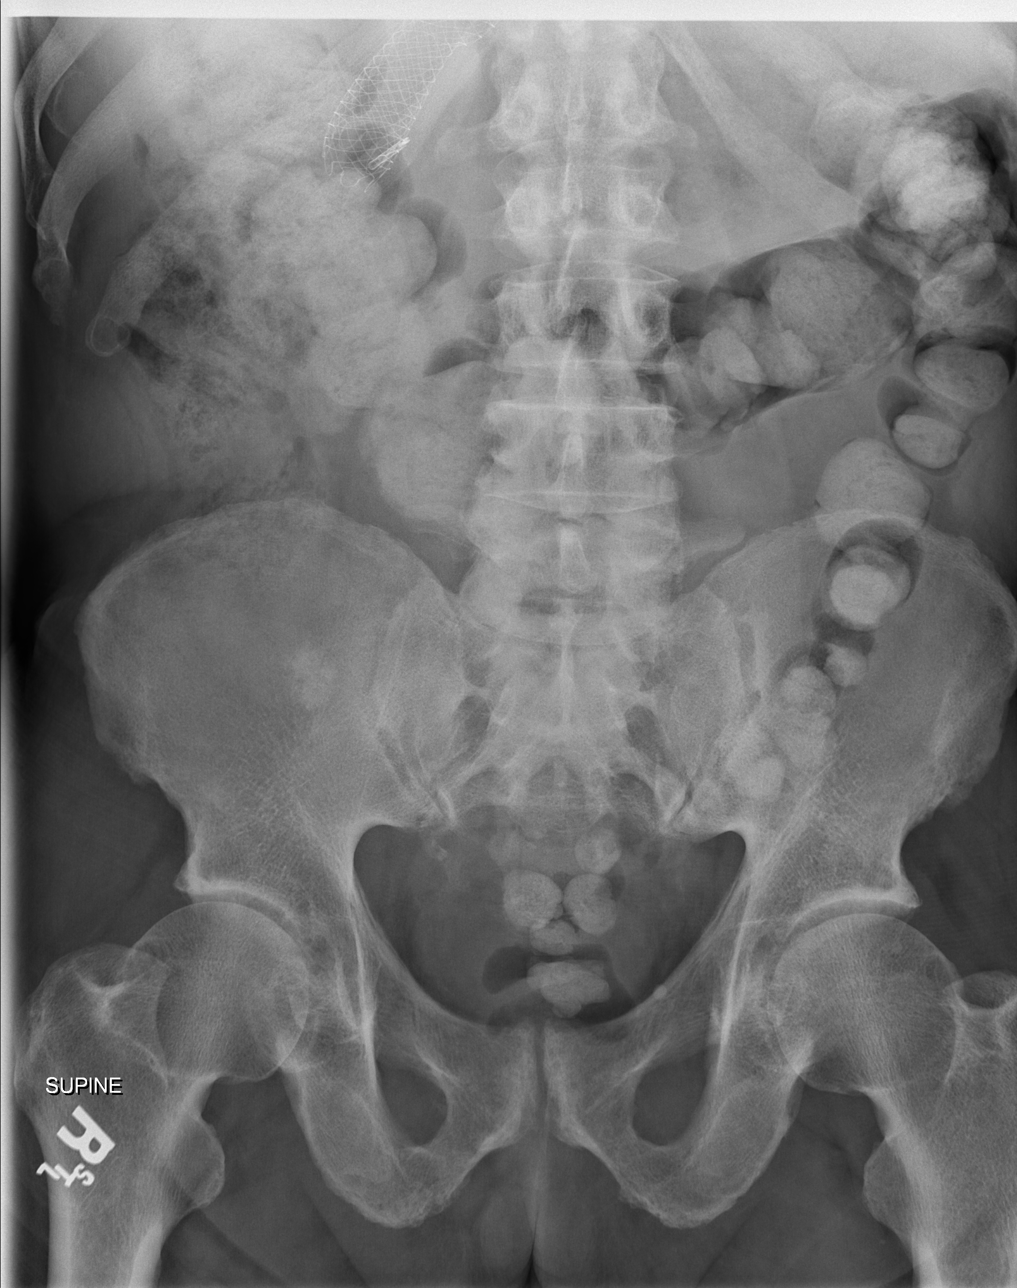

[t abdomen supine (2 of 2)]
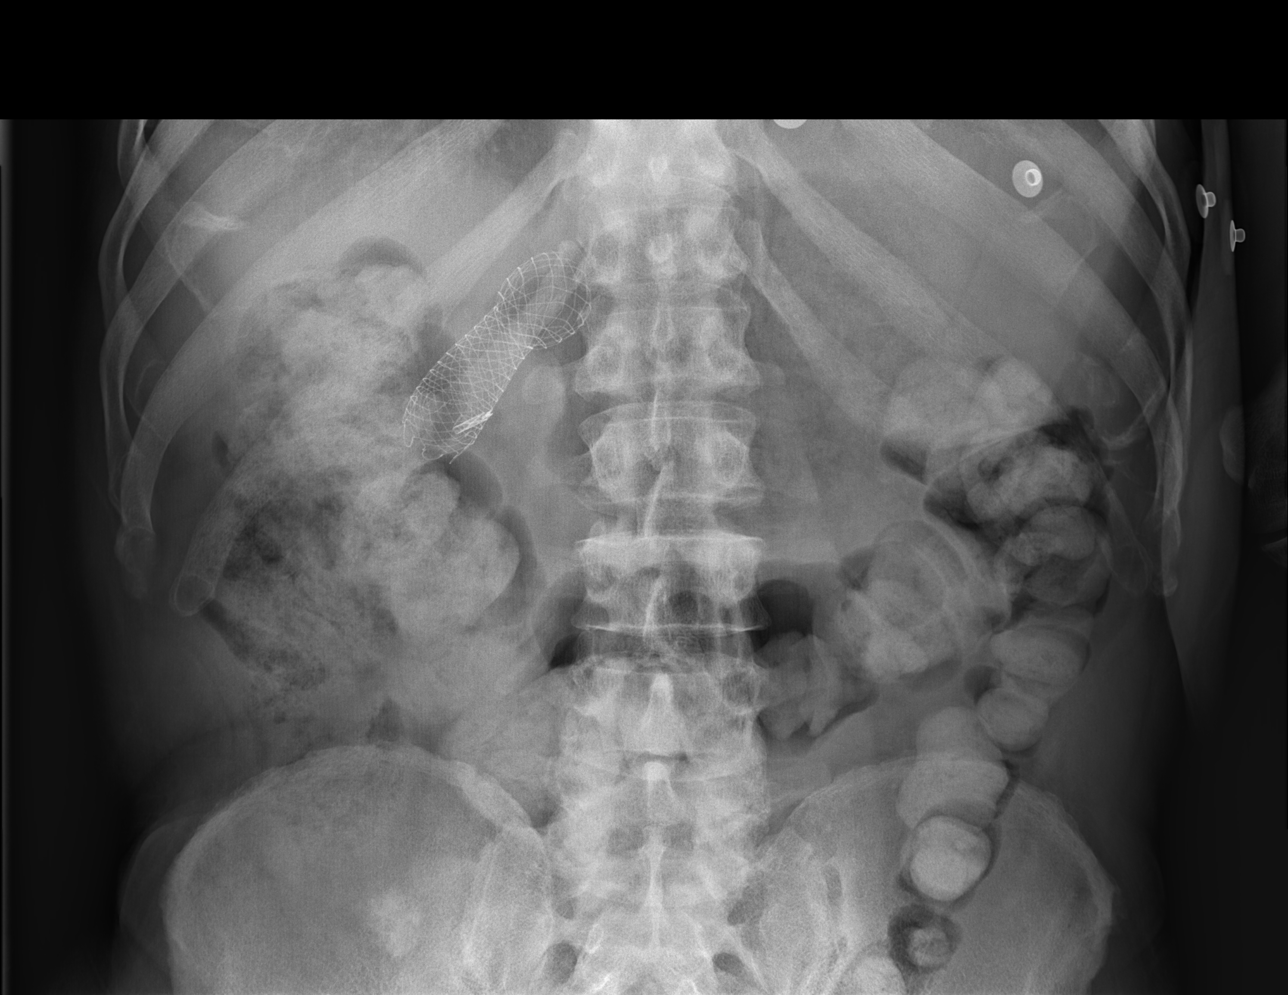

[4 of 4 positions shown; findings below may reference images not displayed]

FINDINGS: Gastric stent is present. There is no evidence of dilated bowel
loops or free intraperitoneal air. Moderate volume stool. There is
some residual contrast throughout the colon. Sclerotic focus again
noted within the right iliac bone. Right-sided Port-A-Cath
terminates at the cavoatrial junction. Heart size and mediastinal
contours are within normal limits. Both lungs are clear.
IMPRESSION: 1. Nonobstructive bowel gas pattern. Moderate volume stool burden.
2. No acute cardiopulmonary process.
